# Patient Record
Sex: Female | Born: 1995 | Race: Black or African American | Hispanic: No | Marital: Single
Health system: Midwestern US, Community
[De-identification: ages and names within clinical notes are randomized; demographics above are authoritative.]

## PROBLEM LIST (undated history)

## (undated) ENCOUNTER — Ambulatory Visit (HOSPITAL_COMMUNITY): Admission: EM | Payer: Medicaid Other | Source: Home / Self Care

## (undated) DIAGNOSIS — G479 Sleep disorder, unspecified: Secondary | ICD-10-CM

## (undated) DIAGNOSIS — F909 Attention-deficit hyperactivity disorder, unspecified type: Secondary | ICD-10-CM

## (undated) DIAGNOSIS — F419 Anxiety disorder, unspecified: Secondary | ICD-10-CM

## (undated) DIAGNOSIS — G43909 Migraine, unspecified, not intractable, without status migrainosus: Secondary | ICD-10-CM

## (undated) DIAGNOSIS — I10 Essential (primary) hypertension: Secondary | ICD-10-CM

## (undated) DIAGNOSIS — D649 Anemia, unspecified: Secondary | ICD-10-CM

## (undated) DIAGNOSIS — B999 Unspecified infectious disease: Secondary | ICD-10-CM

## (undated) DIAGNOSIS — R0600 Dyspnea, unspecified: Secondary | ICD-10-CM

## (undated) DIAGNOSIS — R569 Unspecified convulsions: Secondary | ICD-10-CM

## (undated) DIAGNOSIS — R87629 Unspecified abnormal cytological findings in specimens from vagina: Secondary | ICD-10-CM

## (undated) DIAGNOSIS — G8929 Other chronic pain: Secondary | ICD-10-CM

## (undated) DIAGNOSIS — T782XXA Anaphylactic shock, unspecified, initial encounter: Secondary | ICD-10-CM

## (undated) DIAGNOSIS — M549 Dorsalgia, unspecified: Secondary | ICD-10-CM

## (undated) DIAGNOSIS — F319 Bipolar disorder, unspecified: Secondary | ICD-10-CM

## (undated) DIAGNOSIS — J45909 Unspecified asthma, uncomplicated: Secondary | ICD-10-CM

## (undated) HISTORY — DX: Anemia, unspecified: D64.9

## (undated) HISTORY — DX: Anxiety disorder, unspecified: F41.9

## (undated) HISTORY — DX: Dyspnea, unspecified: R06.00

## (undated) HISTORY — PX: NO PAST SURGERIES: SHX2092

## (undated) HISTORY — DX: Unspecified abnormal cytological findings in specimens from vagina: R87.629

## (undated) HISTORY — DX: Unspecified convulsions: R56.9

---
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
## 2014-03-01 ENCOUNTER — Inpatient Hospital Stay
Admit: 2014-03-01 | Discharge: 2014-03-01 | Disposition: A | Discharge status: Home / Self Care | Payer: Self-pay | Attending: Emergency Medicine

## 2014-03-01 DIAGNOSIS — N946 Dysmenorrhea, unspecified: Secondary | ICD-10-CM

## 2014-03-01 LAB — URINALYSIS W/ RFLX MICROSCOPIC
Bilirubin: NEGATIVE
Glucose: NEGATIVE mg/dL
Ketone: NEGATIVE mg/dL
Nitrites: NEGATIVE
Specific gravity: 1.018 (ref 1.005–1.030)
Urobilinogen: 1 EU/dL (ref 0.2–1.0)
pH (UA): 7 (ref 5.0–8.0)

## 2014-03-01 LAB — URINE MICROSCOPIC ONLY

## 2014-03-01 LAB — HCG URINE, QL: HCG urine, QL: NEGATIVE

## 2014-03-01 NOTE — ED Notes (Signed)
I have reviewed discharge instructions with the patient.  The patient verbalized understanding.

## 2014-03-01 NOTE — ED Provider Notes (Signed)
HPI Comments: 19yoF to ED c/o period pain and nausea. Pt states she vomited x 1 yesterday and called out of work today due to period pain. Also states her nose is congested and she cannot breathe. States she usually has period cramps like this. Denies fever, chills, diarrhea, cough, HA, dizziness, CP, SOB, dysuria, or any other complaints. States she needs a work note. Currently not nauseated.    Patient is a 19 y.o. female presenting with nausea. The history is provided by the patient.   Nausea          Past Medical History:   Diagnosis Date   ??? Anaphylaxis        No past surgical history on file.      No family history on file.    History     Social History   ??? Marital Status: SINGLE     Spouse Name: N/A   ??? Number of Children: N/A   ??? Years of Education: N/A     Occupational History   ??? Not on file.     Social History Main Topics   ??? Smoking status: Current Some Day Smoker   ??? Smokeless tobacco: Not on file   ??? Alcohol Use: Yes      Comment: occasionally   ??? Drug Use: No   ??? Sexual Activity:     Partners: Male     Pharmacist, hospitalBirth Control/ Protection: None     Other Topics Concern   ??? Not on file     Social History Narrative           ALLERGIES: Review of patient's allergies indicates no known allergies.      Review of Systems   Gastrointestinal: Positive for nausea.   All other systems reviewed and are negative.      Filed Vitals:    03/01/14 0924   BP: 127/83   Pulse: 97   Temp: 98.5 ??F (36.9 ??C)   Resp: 20   Height: 5\' 5"  (1.651 m)   Weight: 94.348 kg (208 lb)   SpO2: 100%            Physical Exam   Constitutional: She is oriented to person, place, and time. She appears well-developed and well-nourished. No distress.   HENT:   Head: Normocephalic and atraumatic.   Right Ear: External ear normal.   Left Ear: External ear normal.   +nasal congestion   Eyes: Conjunctivae are normal.   Neck: Normal range of motion. Neck supple.   Cardiovascular: Normal rate and regular rhythm.     Pulmonary/Chest: Effort normal and breath sounds normal.   Abdominal: Soft. Bowel sounds are normal. She exhibits no distension. There is no tenderness.   Musculoskeletal: Normal range of motion. She exhibits no edema.   Lymphadenopathy:     She has no cervical adenopathy.   Neurological: She is alert and oriented to person, place, and time.   Skin: Skin is warm and dry. No rash noted. She is not diaphoretic.   Psychiatric: She has a normal mood and affect.        MDM  Number of Diagnoses or Management Options  Diagnosis management comments: Pt here with dysmenorrhea and nasal congestion. I suggested she get OTC sudafed and motrin for her symptoms. Currently not nauseated. States she needs a work note for calling out of work. Amariss Detamore, PA 10:24 AM           Amount and/or Complexity of Data Reviewed  Clinical  lab tests: ordered and reviewed    Risk of Complications, Morbidity, and/or Mortality  Presenting problems: moderate  Diagnostic procedures: low  Management options: low    Patient Progress  Patient progress: stable      Procedures

## 2014-03-01 NOTE — ED Notes (Signed)
Patient states she threw up once last night and needs a note for work. Patient stated she has had stomach pains for 2 weeks.

## 2014-11-14 ENCOUNTER — Inpatient Hospital Stay
Admit: 2014-11-14 | Discharge: 2014-11-15 | Disposition: A | Discharge status: Home / Self Care | Payer: Self-pay | Attending: Emergency Medicine

## 2014-11-14 DIAGNOSIS — N907 Vulvar cyst: Secondary | ICD-10-CM

## 2014-11-14 NOTE — ED Triage Notes (Signed)
Pt presents to the ED with CC of vaginal skin problem and swelling to the right labia. Pt states "I have a bump on my pussy." Pt denies pain or draining. Pt states noticed swelling to the right labia after menstrual cycle.

## 2014-11-14 NOTE — ED Provider Notes (Signed)
HPI Comments: 8:01PM Megan Buck is a 19 year old female siwith no pertinent medical hx who presents to ED c/o vaginal abscess to right labia. Pt explains onset after menstrual cycle. Denies vaginal discharge/bleeding, fever and chills. Denies hx of STD. Denies any chance of pregnancy. Reports smoking cigarettes and marijuana. No further complaints at this time.    Patient is a 19 y.o. female presenting with skin problem and vaginal edema. The history is provided by the patient.   Skin Problem      Vaginal Swelling   Primary symptoms include no vaginal bleeding. Pertinent negatives include no fever.        Past Medical History:   Diagnosis Date   ??? Anaphylaxis        History reviewed. No pertinent past surgical history.      History reviewed. No pertinent family history.    Social History     Social History   ??? Marital status: SINGLE     Spouse name: N/A   ??? Number of children: N/A   ??? Years of education: N/A     Occupational History   ??? Not on file.     Social History Main Topics   ??? Smoking status: Current Some Day Smoker   ??? Smokeless tobacco: Not on file   ??? Alcohol use Yes      Comment: occasionally   ??? Drug use: No   ??? Sexual activity: Yes     Partners: Male     Birth control/ protection: None     Other Topics Concern   ??? Not on file     Social History Narrative         ALLERGIES: Mushroom and Lime    Review of Systems   Constitutional: Negative.  Negative for chills and fever.   HENT: Negative.    Eyes: Negative.    Respiratory: Negative.    Cardiovascular: Negative.    Gastrointestinal: Negative.    Endocrine: Negative.    Genitourinary: Negative.  Negative for vaginal bleeding and vaginal discharge.   Musculoskeletal: Negative.    Skin: Negative.         Vaginal swelling   Allergic/Immunologic: Negative.    Neurological: Negative.    Hematological: Negative.    Psychiatric/Behavioral: Negative.    All other systems reviewed and are negative.      Vitals:    11/14/14 1828   BP: (!) 126/92    Pulse: 97   Resp: 16   Temp: 98.4 ??F (36.9 ??C)   SpO2: 100%   Weight: 89.3 kg (196 lb 12.8 oz)   Height:  (1.651 m)            Physical Exam   Constitutional: She is oriented to person, place, and time. She appears well-developed and well-nourished. No distress.   HENT:   Head: Normocephalic.   Right Ear: External ear normal.   Left Ear: External ear normal.   Mouth/Throat: No oropharyngeal exudate.   Eyes: Conjunctivae and EOM are normal. Pupils are equal, round, and reactive to light. Right eye exhibits no discharge. Left eye exhibits no discharge. No scleral icterus.   Neck: Normal range of motion. Neck supple. No JVD present. No tracheal deviation present. No thyromegaly present.   Cardiovascular: Normal rate, regular rhythm, normal heart sounds and intact distal pulses.  Exam reveals no gallop and no friction rub.    No murmur heard.  Pulmonary/Chest: Effort normal and breath sounds normal. No stridor. No respiratory distress.  She has no wheezes. She has no rales. She exhibits no tenderness.   Abdominal: Soft. Bowel sounds are normal. She exhibits no distension and no mass. There is no tenderness. There is no rebound and no guarding.   Genitourinary:   Genitourinary Comments: External right labia majora 2 mm area of swelling with fluctuance and erythema  Minimal tenderness with palpation   Musculoskeletal: Normal range of motion. She exhibits no edema or tenderness.   Lymphadenopathy:     She has no cervical adenopathy.   Neurological: She is alert and oriented to person, place, and time. She displays normal reflexes. No cranial nerve deficit. She exhibits normal muscle tone. Coordination normal.   Skin: Skin is warm and dry. No rash noted. She is not diaphoretic. No erythema. No pallor.   Nursing note and vitals reviewed.       MDM  Number of Diagnoses or Management Options  Epidermoid cyst of labia majora:   Risk of Complications, Morbidity, and/or Mortality  Presenting problems: low   Diagnostic procedures: low  Management options: low      ED Course       Procedures      VITALS:  Patient Vitals for the past 24 hrs:   BP Temp Pulse Resp SpO2 Height Weight   11/14/14 1828 (!) 126/92 98.4 ??F (36.9 ??C) 97 16 100 % 5\' 5"  (1.651 m) 89.3 kg (196 lb 12.8 oz)         ED MEDS:  Medications - No data to display      Reevaluation of patient:   8:38 PM   Rechecked patient. The patient is resting comfortably on the ED cart with no new complaints vocalized. Upon reevaluation, patient reports feeling better. I informed the patient on all ED findings, my clinical impression, and plan for discharge. The patient verbalized understanding and agreement with the plan. All questions answered.      Diagnosis:   1. Epidermoid cyst of labia majora          Discharge/Disposition:      Patient was discharged in stable condition.     Rechecked the pt. I have informed the pt of results and diagnosis. I have given the pt written instructions regarding their diagnosis, expectations, follow up and return precautions regarding diagnosis. I explained to the pt that emergent conditions may arise, and to return to the ED for those conditions or for new or worsening symptoms. I've explained the importance of follow-up with their PCP. Pt understands and agrees with plan. All questions answered. Pt is ready for discharge.       Scribe Attestation  I, Health visitorVania Johnson-Chaney, am scribing for and in the presence of Sherren KernsKenneth B Azucena Dart, MD at 8:01 PM on 11/14/14  Traci SermonVania Johnson-Chaney, Scribe      Physician Attestation  I personally performed the services described in the documentation, reviewed the documentation, as recorded by the scribe in my presence, and it accurately and completely records my words and actions.    Darlis LoanKenneth Darian Cansler, MD

## 2014-11-14 NOTE — ED Notes (Signed)
Megan Buck is a 19 y.o. female that was discharged in stable. Pt was accompanied by friend. Pt is not driving. The patients diagnosis, condition and treatment were explained to  patient and aftercare instructions were given.  The patient verbalized understanding. Patient armband removed and shredded.

## 2014-11-15 LAB — URINALYSIS W/ RFLX MICROSCOPIC
Bilirubin: NEGATIVE
Blood: NEGATIVE
Glucose: NEGATIVE mg/dL
Ketone: NEGATIVE mg/dL
Nitrites: NEGATIVE
Protein: NEGATIVE mg/dL
Specific gravity: 1.02 (ref 1.005–1.030)
Urobilinogen: 1 EU/dL (ref 0.2–1.0)
pH (UA): 8.5 — ABNORMAL HIGH (ref 5.0–8.0)

## 2014-11-15 LAB — URINE MICROSCOPIC ONLY
RBC: 0 /hpf (ref 0–5)
WBC: 0 /hpf (ref 0–4)

## 2014-11-15 LAB — HCG URINE, QL: HCG urine, QL: NEGATIVE

## 2014-11-15 MED ORDER — IBUPROFEN 400 MG TAB
400 mg | ORAL | Status: DC
Start: 2014-11-15 — End: 2014-11-14

## 2014-11-15 MED ORDER — DOXYCYCLINE 100 MG CAP
100 mg | ORAL_CAPSULE | Freq: Two times a day (BID) | ORAL | 0 refills | Status: AC
Start: 2014-11-15 — End: 2014-11-24

## 2014-11-15 MED ORDER — DOXYCYCLINE HYCLATE 100 MG CAP
100 mg | ORAL | Status: DC
Start: 2014-11-15 — End: 2014-11-14

## 2014-11-15 MED ORDER — IBUPROFEN 600 MG TAB
600 mg | ORAL_TABLET | Freq: Four times a day (QID) | ORAL | 0 refills | Status: DC | PRN
Start: 2014-11-15 — End: 2016-02-04

## 2014-11-15 MED FILL — DOXYCYCLINE HYCLATE 100 MG CAP: 100 mg | ORAL | Qty: 1

## 2014-11-15 MED FILL — IBUPROFEN 400 MG TAB: 400 mg | ORAL | Qty: 2

## 2015-02-22 ENCOUNTER — Encounter (HOSPITAL_COMMUNITY): Payer: Self-pay | Admitting: Emergency Medicine

## 2015-02-22 DIAGNOSIS — G43909 Migraine, unspecified, not intractable, without status migrainosus: Secondary | ICD-10-CM | POA: Insufficient documentation

## 2015-02-22 DIAGNOSIS — F17200 Nicotine dependence, unspecified, uncomplicated: Secondary | ICD-10-CM | POA: Diagnosis not present

## 2015-02-22 NOTE — ED Notes (Signed)
Pt. reports migraine headache with photophobia onset this evening , denies fever ,emesis or head injury.

## 2015-02-23 ENCOUNTER — Encounter (HOSPITAL_COMMUNITY): Payer: Self-pay | Admitting: Emergency Medicine

## 2015-02-23 ENCOUNTER — Emergency Department (HOSPITAL_COMMUNITY)
Admission: EM | Admit: 2015-02-23 | Discharge: 2015-02-23 | Disposition: A | Discharge status: Home / Self Care | Payer: Medicaid Other | Attending: Emergency Medicine | Admitting: Emergency Medicine

## 2015-02-23 HISTORY — DX: Sleep disorder, unspecified: G47.9

## 2015-02-23 HISTORY — DX: Bipolar disorder, unspecified: F31.9

## 2015-02-23 HISTORY — DX: Migraine, unspecified, not intractable, without status migrainosus: G43.909

## 2015-02-23 HISTORY — DX: Attention-deficit hyperactivity disorder, unspecified type: F90.9

## 2015-02-23 LAB — BASIC METABOLIC PANEL
ANION GAP: 9 (ref 5–15)
BUN: 14 mg/dL (ref 6–20)
CALCIUM: 9.1 mg/dL (ref 8.9–10.3)
CO2: 22 mmol/L (ref 22–32)
CREATININE: 0.66 mg/dL (ref 0.44–1.00)
Chloride: 108 mmol/L (ref 101–111)
GFR calc Af Amer: >60 mL/min (ref >60)
GLUCOSE: 89 mg/dL (ref 65–99)
Potassium: 3.6 mmol/L (ref 3.5–5.1)
Sodium: 139 mmol/L (ref 135–145)

## 2015-02-23 LAB — CBC WITH DIFFERENTIAL/PLATELET
BASOS ABS: 0 10*3/uL (ref 0.0–0.1)
Basophils Relative: 0 %
EOS PCT: 3 %
Eosinophils Absolute: 0.4 10*3/uL (ref 0.0–0.7)
HEMATOCRIT: 35.4 % — AB (ref 36.0–46.0)
Hemoglobin: 11.4 g/dL — ABNORMAL LOW (ref 12.0–15.0)
LYMPHS PCT: 33 %
Lymphs Abs: 4.9 10*3/uL — ABNORMAL HIGH (ref 0.7–4.0)
MCH: 26.6 pg (ref 26.0–34.0)
MCHC: 32.2 g/dL (ref 30.0–36.0)
MCV: 82.7 fL (ref 78.0–100.0)
MONO ABS: 0.7 10*3/uL (ref 0.1–1.0)
MONOS PCT: 5 %
Neutro Abs: 8.6 10*3/uL — ABNORMAL HIGH (ref 1.7–7.7)
Neutrophils Relative %: 59 %
PLATELETS: 309 10*3/uL (ref 150–400)
RBC: 4.28 MIL/uL (ref 3.87–5.11)
RDW: 15.1 % (ref 11.5–15.5)
WBC: 14.7 10*3/uL — ABNORMAL HIGH (ref 4.0–10.5)

## 2015-02-23 NOTE — ED Notes (Signed)
Patient left stated that she would go to Urgent care in the morning cause she felt much better and had something to do.Robyn Cummings

## 2015-03-13 ENCOUNTER — Encounter (HOSPITAL_COMMUNITY): Payer: Self-pay | Admitting: Emergency Medicine

## 2015-03-13 ENCOUNTER — Emergency Department (HOSPITAL_COMMUNITY)
Admission: EM | Admit: 2015-03-13 | Discharge: 2015-03-13 | Disposition: A | Discharge status: Home / Self Care | Payer: Medicaid Other | Attending: Emergency Medicine | Admitting: Emergency Medicine

## 2015-03-13 DIAGNOSIS — Y9389 Activity, other specified: Secondary | ICD-10-CM | POA: Insufficient documentation

## 2015-03-13 DIAGNOSIS — Y998 Other external cause status: Secondary | ICD-10-CM | POA: Diagnosis not present

## 2015-03-13 DIAGNOSIS — F17200 Nicotine dependence, unspecified, uncomplicated: Secondary | ICD-10-CM | POA: Insufficient documentation

## 2015-03-13 DIAGNOSIS — G8929 Other chronic pain: Secondary | ICD-10-CM | POA: Insufficient documentation

## 2015-03-13 DIAGNOSIS — X58XXXA Exposure to other specified factors, initial encounter: Secondary | ICD-10-CM | POA: Diagnosis not present

## 2015-03-13 DIAGNOSIS — S29012A Strain of muscle and tendon of back wall of thorax, initial encounter: Secondary | ICD-10-CM | POA: Insufficient documentation

## 2015-03-13 DIAGNOSIS — Y9289 Other specified places as the place of occurrence of the external cause: Secondary | ICD-10-CM | POA: Insufficient documentation

## 2015-03-13 DIAGNOSIS — S3992XA Unspecified injury of lower back, initial encounter: Secondary | ICD-10-CM | POA: Diagnosis present

## 2015-03-13 DIAGNOSIS — S4992XA Unspecified injury of left shoulder and upper arm, initial encounter: Secondary | ICD-10-CM | POA: Insufficient documentation

## 2015-03-13 DIAGNOSIS — S4991XA Unspecified injury of right shoulder and upper arm, initial encounter: Secondary | ICD-10-CM | POA: Insufficient documentation

## 2015-03-13 DIAGNOSIS — S29019A Strain of muscle and tendon of unspecified wall of thorax, initial encounter: Secondary | ICD-10-CM

## 2015-03-13 DIAGNOSIS — Z8679 Personal history of other diseases of the circulatory system: Secondary | ICD-10-CM | POA: Diagnosis not present

## 2015-03-13 DIAGNOSIS — Z8659 Personal history of other mental and behavioral disorders: Secondary | ICD-10-CM | POA: Insufficient documentation

## 2015-03-13 MED ORDER — IBUPROFEN 600 MG PO TABS: 600.0000 mg | ORAL_TABLET | Freq: Three times a day (TID) | ORAL | Status: DC

## 2015-03-13 MED ORDER — METHOCARBAMOL 500 MG PO TABS: 1000.0000 mg | ORAL_TABLET | Freq: Three times a day (TID) | ORAL | Status: DC | PRN

## 2015-03-13 MED ORDER — OXYCODONE-ACETAMINOPHEN 5-325 MG PO TABS
1.0000 | ORAL_TABLET | Freq: Once | ORAL | Status: AC
  Administered 2015-03-13: 1 via ORAL

## 2015-03-13 MED ORDER — KETOROLAC TROMETHAMINE 60 MG/2ML IM SOLN
60.0000 mg | Freq: Once | INTRAMUSCULAR | Status: AC
  Administered 2015-03-13: 60 mg via INTRAMUSCULAR
  Filled 2015-03-13: qty 2

## 2015-03-13 MED ORDER — METHOCARBAMOL 500 MG PO TABS
1000.0000 mg | ORAL_TABLET | Freq: Once | ORAL | Status: AC
  Administered 2015-03-13: 1000 mg via ORAL
  Filled 2015-03-13: qty 2

## 2015-03-13 NOTE — Discharge Instructions (Signed)

## 2015-03-13 NOTE — ED Notes (Signed)
Pt. reports chronic back pain for several years worse this week , pain radiating to shoulders unrelieved by OTC Ibuprofen . Pain ( upper/lower back ) increases with movement and changing positions . Pt. Denies  injury or fall .

## 2015-03-13 NOTE — ED Provider Notes (Signed)
CSN: [id number]     Arrival date & time 03/13/15  0425 History   First MD Initiated Contact with Patient 03/13/15 [phone]     Chief Complaint  Patient presents with  . Back Pain     (Consider location/radiation/quality/duration/timing/severity/associated sxs/prior Treatment) HPI Patient presents with acute worsening of her chronic back pain. Pain is mostly in the upper back. Is worse with movement and palpation. Patient states it is exacerbated by heavy lifting at work. No other trauma. Denies fever or chills. No shortness of breath or cough. No incontinence, focal weakness or numbness. Patient states she occasionally takes 1000 g of ibuprofen for pain. Took 1000 mg at 1 AM. Past Medical History  Diagnosis Date  . Anaphylactic reaction   . Migraine   . Bipolar 1 disorder (HCC)   . ADHD (attention deficit hyperactivity disorder)   . Sleep disorder    History reviewed. No pertinent past surgical history. No family history on file. Social History  Substance Use Topics  . Smoking status: Current Every Day Smoker  . Smokeless tobacco: None  . Alcohol Use: No   OB History    Gravida Para Term Preterm AB TAB SAB Ectopic Multiple Living   0 0 0 0 0 0 0 0       Review of Systems  Constitutional: Negative for fever and chills.  Respiratory: Negative for shortness of breath.   Cardiovascular: Negative for chest pain.  Gastrointestinal: Negative for nausea, vomiting and abdominal pain.  Genitourinary: Negative for difficulty urinating.  Musculoskeletal: Positive for myalgias and back pain. Negative for neck pain and neck stiffness.  Skin: Negative for rash and wound.  Neurological: Negative for weakness and numbness.  All other systems reviewed and are negative.     Allergies  Mushroom extract complex; Fish allergy; Lime; and Spinach  Home Medications   Prior to Admission medications   Medication Sig Start Date End Date Taking? Authorizing Provider  diphenhydrAMINE (BENADRYL)  25 MG tablet Take 1 tablet (25 mg total) by mouth every 6 (six) hours. 07/05/13   Blake Divine, MD  EPINEPHrine (EPIPEN) 0.3 mg/0.3 mL IJ SOAJ injection Inject 0.3 mLs (0.3 mg total) into the muscle as needed. 07/05/13   Blake Divine, MD  ibuprofen (ADVIL,MOTRIN) 600 MG tablet Take 1 tablet (600 mg total) by mouth 3 (three) times daily after meals. 03/13/15   Loren Racer, MD  methocarbamol (ROBAXIN) 500 MG tablet Take 2 tablets (1,000 mg total) by mouth every 8 (eight) hours as needed for muscle spasms. 03/13/15   Loren Racer, MD   BP 145/97 mmHg  Pulse 78  Temp(Src) 98.7 F (37.1 C) (Oral)  Resp 16  Ht  (1.651 m)  Wt 190 lb (86.183 kg)  BMI 31.62 kg/m2  SpO2 99%  LMP 02/04/2015 (Exact Date) Physical Exam  Constitutional: She is oriented to person, place, and time. She appears well-developed and well-nourished. No distress.  HENT:  Head: Normocephalic and atraumatic.  Mouth/Throat: Oropharynx is clear and moist.  Eyes: EOM are normal. Pupils are equal, round, and reactive to light.  Neck: Normal range of motion. Neck supple.  Bilateral trapezius tenderness to palpation. No definite meningismus  Cardiovascular: Normal rate and regular rhythm.  Exam reveals no gallop and no friction rub.   No murmur heard. Pulmonary/Chest: Effort normal and breath sounds normal. No respiratory distress. She has no wheezes. She has no rales.  Abdominal: Soft. Bowel sounds are normal.  Musculoskeletal: Normal range of motion. She exhibits tenderness. She exhibits  no edema.  Diffuse tenderness to the thoracic musculature bilaterally. No bony or midline tenderness. No deformity. No midline lumbar tenderness. Negative bilateral straight leg raise. Distal pulses are equal and intact.  Neurological: She is alert and oriented to person, place, and time.  Patient ambulates without assistance. 5/5 motor in all extremities. Sensation is fully intact. No saddle anesthesia.  Skin: Skin is warm and dry. No rash  noted. No erythema.  Psychiatric: She has a normal mood and affect. Her behavior is normal.  Nursing note and vitals reviewed.   ED Course  Procedures (including critical care time) Labs Review Labs Reviewed - No data to display  Imaging Review No results found. I have personally reviewed and evaluated these images and lab results as part of my medical decision-making.   EKG Interpretation None      MDM   Final diagnoses:  Thoracic myofascial strain, initial encounter    Patient with muscular tenderness especially of the thoracic musculature. Consistent with muscle strain and spasm. Patient educated on proper use of NSAIDs and following package instructions. Will also give muscle relaxant. Return precautions given.    Loren Raceravid Kaithlyn Teagle, MD 03/13/15 [phone]

## 2015-04-15 ENCOUNTER — Ambulatory Visit (HOSPITAL_COMMUNITY)
Admission: EM | Admit: 2015-04-15 | Discharge: 2015-04-15 | Disposition: A | Discharge status: Home / Self Care | Payer: No Typology Code available for payment source | Source: Ambulatory Visit | Attending: Emergency Medicine | Admitting: Emergency Medicine

## 2015-04-15 ENCOUNTER — Encounter (HOSPITAL_COMMUNITY): Payer: Self-pay

## 2015-04-15 ENCOUNTER — Emergency Department (HOSPITAL_COMMUNITY)
Admission: EM | Admit: 2015-04-15 | Discharge: 2015-04-15 | Disposition: A | Discharge status: Home / Self Care | Payer: Medicaid Other | Attending: Emergency Medicine | Admitting: Emergency Medicine

## 2015-04-15 DIAGNOSIS — M549 Dorsalgia, unspecified: Secondary | ICD-10-CM | POA: Insufficient documentation

## 2015-04-15 DIAGNOSIS — F1210 Cannabis abuse, uncomplicated: Secondary | ICD-10-CM | POA: Insufficient documentation

## 2015-04-15 DIAGNOSIS — G8929 Other chronic pain: Secondary | ICD-10-CM | POA: Insufficient documentation

## 2015-04-15 DIAGNOSIS — F17210 Nicotine dependence, cigarettes, uncomplicated: Secondary | ICD-10-CM | POA: Insufficient documentation

## 2015-04-15 DIAGNOSIS — Y9289 Other specified places as the place of occurrence of the external cause: Secondary | ICD-10-CM | POA: Insufficient documentation

## 2015-04-15 DIAGNOSIS — Z0441 Encounter for examination and observation following alleged adult rape: Secondary | ICD-10-CM | POA: Insufficient documentation

## 2015-04-15 DIAGNOSIS — Z791 Long term (current) use of non-steroidal anti-inflammatories (NSAID): Secondary | ICD-10-CM | POA: Insufficient documentation

## 2015-04-15 DIAGNOSIS — Y9389 Activity, other specified: Secondary | ICD-10-CM | POA: Insufficient documentation

## 2015-04-15 DIAGNOSIS — M545 Low back pain: Secondary | ICD-10-CM | POA: Insufficient documentation

## 2015-04-15 DIAGNOSIS — F1310 Sedative, hypnotic or anxiolytic abuse, uncomplicated: Secondary | ICD-10-CM | POA: Insufficient documentation

## 2015-04-15 DIAGNOSIS — Z8659 Personal history of other mental and behavioral disorders: Secondary | ICD-10-CM | POA: Insufficient documentation

## 2015-04-15 DIAGNOSIS — Z8679 Personal history of other diseases of the circulatory system: Secondary | ICD-10-CM | POA: Insufficient documentation

## 2015-04-15 DIAGNOSIS — Y998 Other external cause status: Secondary | ICD-10-CM | POA: Insufficient documentation

## 2015-04-15 DIAGNOSIS — Z79899 Other long term (current) drug therapy: Secondary | ICD-10-CM | POA: Insufficient documentation

## 2015-04-15 HISTORY — DX: Dorsalgia, unspecified: M54.9

## 2015-04-15 HISTORY — DX: Other chronic pain: G89.29

## 2015-04-15 LAB — RAPID URINE DRUG SCREEN, HOSP PERFORMED
Amphetamines: NOT DETECTED
Barbiturates: NOT DETECTED
Benzodiazepines: POSITIVE — AB
Cocaine: NOT DETECTED
Opiates: NOT DETECTED
TETRAHYDROCANNABINOL: POSITIVE — AB

## 2015-04-15 LAB — POC URINE PREG, ED: PREG TEST UR: NEGATIVE

## 2015-04-15 MED ORDER — METRONIDAZOLE 500 MG PO TABS
2000.0000 mg | ORAL_TABLET | Freq: Once | ORAL | Status: AC
  Administered 2015-04-15: 2000 mg via ORAL

## 2015-04-15 MED ORDER — PROMETHAZINE HCL 25 MG PO TABS: 25.0000 mg | ORAL_TABLET | Freq: Four times a day (QID) | ORAL | Status: DC | PRN

## 2015-04-15 MED ORDER — IBUPROFEN 200 MG PO TABS
400.0000 mg | ORAL_TABLET | Freq: Once | ORAL | Status: AC
  Administered 2015-04-15: 400 mg via ORAL
  Filled 2015-04-15: qty 2

## 2015-04-15 MED ORDER — ULIPRISTAL ACETATE 30 MG PO TABS
30.0000 mg | ORAL_TABLET | Freq: Once | ORAL | Status: AC
  Administered 2015-04-15: 30 mg via ORAL
  Filled 2015-04-15: qty 1

## 2015-04-15 MED ORDER — AZITHROMYCIN 250 MG PO TABS
1000.0000 mg | ORAL_TABLET | Freq: Once | ORAL | Status: AC
Start: 2015-04-15 — End: 2015-04-15
  Administered 2015-04-15: 1000 mg via ORAL

## 2015-04-15 MED ORDER — CEFTRIAXONE SODIUM 250 MG IJ SOLR
250.0000 mg | Freq: Once | INTRAMUSCULAR | Status: AC
  Administered 2015-04-15: 250 mg via INTRAMUSCULAR

## 2015-04-15 MED ORDER — LIDOCAINE HCL (PF) 1 % IJ SOLN
0.9000 mL | Freq: Once | INTRAMUSCULAR | Status: AC
  Administered 2015-04-15: 0.9 mL

## 2015-04-15 NOTE — ED Notes (Signed)
SANE RN remains at beside.

## 2015-04-15 NOTE — SANE Note (Signed)
At 1145, patient's mother and aunt arrived. Patient crying stating that she wanted to be tested for disease. Patient's speech is slurred, but she is awake and oriented. Reviewed SANE evaluation process. Patient agrees to anonymous collection.  RN and MD aware.
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
## 2015-04-15 NOTE — SANE Note (Signed)
At 1100, FNE approached patient who was sleeping soundly. FNE woke patient several times who would then go back to sleep. Patient would also fall asleep while talking. Notified MD and RN that FNE would need to wait until patient was fully alert and oriented before attempting SANE consult.

## 2015-04-15 NOTE — ED Notes (Signed)
SANE RN reports that she can not be here for at least 6 hours.  Consulting civil engineerCharge RN notified.

## 2015-04-15 NOTE — ED Notes (Signed)
Charge RN made aware that they have called in another SANE RN.
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
## 2015-04-18 NOTE — SANE Note (Signed)
Reviewed discharge instructions with patient and mother. Encouraged follow up with GYN for STD/HIV/pregnancy testing. Mother and aunt state they will notify personal GYN. Declined Family Services referral..."We'll provide our own counseling".

## 2015-05-08 ENCOUNTER — Encounter (HOSPITAL_COMMUNITY): Payer: Self-pay | Admitting: Emergency Medicine

## 2015-05-08 ENCOUNTER — Emergency Department (HOSPITAL_COMMUNITY)
Admission: EM | Admit: 2015-05-08 | Discharge: 2015-05-09 | Disposition: A | Discharge status: Home / Self Care | Payer: No Typology Code available for payment source | Attending: Emergency Medicine | Admitting: Emergency Medicine

## 2015-05-08 ENCOUNTER — Emergency Department (HOSPITAL_COMMUNITY): Payer: No Typology Code available for payment source

## 2015-05-08 DIAGNOSIS — S0990XA Unspecified injury of head, initial encounter: Secondary | ICD-10-CM | POA: Insufficient documentation

## 2015-05-08 DIAGNOSIS — G8929 Other chronic pain: Secondary | ICD-10-CM | POA: Insufficient documentation

## 2015-05-08 DIAGNOSIS — F1210 Cannabis abuse, uncomplicated: Secondary | ICD-10-CM | POA: Diagnosis present

## 2015-05-08 DIAGNOSIS — F0781 Postconcussional syndrome: Secondary | ICD-10-CM

## 2015-05-08 DIAGNOSIS — Z8679 Personal history of other diseases of the circulatory system: Secondary | ICD-10-CM | POA: Insufficient documentation

## 2015-05-08 DIAGNOSIS — R413 Other amnesia: Secondary | ICD-10-CM | POA: Diagnosis present

## 2015-05-08 DIAGNOSIS — Y9389 Activity, other specified: Secondary | ICD-10-CM | POA: Insufficient documentation

## 2015-05-08 DIAGNOSIS — Y998 Other external cause status: Secondary | ICD-10-CM | POA: Insufficient documentation

## 2015-05-08 DIAGNOSIS — Y92410 Unspecified street and highway as the place of occurrence of the external cause: Secondary | ICD-10-CM | POA: Insufficient documentation

## 2015-05-08 DIAGNOSIS — S199XXA Unspecified injury of neck, initial encounter: Secondary | ICD-10-CM | POA: Insufficient documentation

## 2015-05-08 DIAGNOSIS — F17210 Nicotine dependence, cigarettes, uncomplicated: Secondary | ICD-10-CM | POA: Insufficient documentation

## 2015-05-08 DIAGNOSIS — Z79899 Other long term (current) drug therapy: Secondary | ICD-10-CM | POA: Insufficient documentation

## 2015-05-08 DIAGNOSIS — F309 Manic episode, unspecified: Secondary | ICD-10-CM | POA: Insufficient documentation

## 2015-05-08 DIAGNOSIS — Z3202 Encounter for pregnancy test, result negative: Secondary | ICD-10-CM | POA: Insufficient documentation

## 2015-05-08 DIAGNOSIS — Z791 Long term (current) use of non-steroidal anti-inflammatories (NSAID): Secondary | ICD-10-CM | POA: Insufficient documentation

## 2015-05-08 LAB — URINALYSIS, ROUTINE W REFLEX MICROSCOPIC
Bilirubin Urine: NEGATIVE
Glucose, UA: NEGATIVE mg/dL
Hgb urine dipstick: NEGATIVE
KETONES UR: 15 mg/dL — AB
LEUKOCYTES UA: NEGATIVE
NITRITE: NEGATIVE
PH: 6.5 (ref 5.0–8.0)
PROTEIN: NEGATIVE mg/dL
Specific Gravity, Urine: 1.028 (ref 1.005–1.030)

## 2015-05-08 LAB — CBC WITH DIFFERENTIAL/PLATELET
BASOS PCT: 0 %
Basophils Absolute: 0 10*3/uL (ref 0.0–0.1)
Eosinophils Absolute: 0.3 10*3/uL (ref 0.0–0.7)
Eosinophils Relative: 2 %
HCT: 38.6 % (ref 36.0–46.0)
Hemoglobin: 12.3 g/dL (ref 12.0–15.0)
LYMPHS PCT: 30 %
Lymphs Abs: 3.7 10*3/uL (ref 0.7–4.0)
MCH: 26.7 pg (ref 26.0–34.0)
MCHC: 31.9 g/dL (ref 30.0–36.0)
MCV: 83.7 fL (ref 78.0–100.0)
Monocytes Absolute: 0.7 10*3/uL (ref 0.1–1.0)
Monocytes Relative: 6 %
NEUTROS ABS: 7.5 10*3/uL (ref 1.7–7.7)
Neutrophils Relative %: 62 %
PLATELETS: 296 10*3/uL (ref 150–400)
RBC: 4.61 MIL/uL (ref 3.87–5.11)
RDW: 14.3 % (ref 11.5–15.5)
WBC: 12.2 10*3/uL — AB (ref 4.0–10.5)

## 2015-05-08 LAB — BASIC METABOLIC PANEL
ANION GAP: 9 (ref 5–15)
BUN: 9 mg/dL (ref 6–20)
CALCIUM: 9.5 mg/dL (ref 8.9–10.3)
CO2: 26 mmol/L (ref 22–32)
Chloride: 104 mmol/L (ref 101–111)
Creatinine, Ser: 0.68 mg/dL (ref 0.44–1.00)
GFR calc non Af Amer: >60 mL/min (ref >60)
GLUCOSE: 93 mg/dL (ref 65–99)
POTASSIUM: 3.6 mmol/L (ref 3.5–5.1)
Sodium: 139 mmol/L (ref 135–145)

## 2015-05-08 LAB — LIPASE, BLOOD: LIPASE: 25 U/L (ref 11–51)

## 2015-05-08 LAB — RAPID URINE DRUG SCREEN, HOSP PERFORMED
Amphetamines: NOT DETECTED
BENZODIAZEPINES: NOT DETECTED
Barbiturates: NOT DETECTED
Cocaine: NOT DETECTED
Opiates: NOT DETECTED
Tetrahydrocannabinol: POSITIVE — AB

## 2015-05-08 LAB — ACETAMINOPHEN LEVEL: Acetaminophen (Tylenol), Serum: <10 ug/mL — ABNORMAL LOW (ref 10–30)

## 2015-05-08 LAB — SALICYLATE LEVEL

## 2015-05-08 LAB — I-STAT BETA HCG BLOOD, ED (MC, WL, AP ONLY)

## 2015-05-08 LAB — ETHANOL
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
## 2015-05-08 NOTE — BH Assessment (Signed)
Consulted with Shuvon Rankin, NP and informed her of UDS results and patient ETOH has not been collected as an update. She recommends patient being seeing face-to-face by psychiatry tomorrow for a consult.   Davina PokeJoVea Oktober Glazer, LCSW Therapeutic Triage Specialist [facility] 05/08/2015 9:59 PM

## 2015-05-08 NOTE — BH Assessment (Signed)
This Clinical research associatewriter added patient to Dr. [name] consult list [facility] be seen face-to-face.   Robyn PokeJoVea Kannon Granderson, Robyn Cummings Therapeutic Triage Specialist [facility] 05/08/2015 11:41 PM
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
## 2015-05-08 NOTE — ED Notes (Signed)
Pt out of room asking to go home.
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
## 2015-05-09 DIAGNOSIS — R413 Other amnesia: Secondary | ICD-10-CM | POA: Diagnosis present

## 2015-05-09 DIAGNOSIS — F1210 Cannabis abuse, uncomplicated: Secondary | ICD-10-CM | POA: Diagnosis present

## 2015-05-09 NOTE — ED Notes (Signed)
Spoke with Talbert ForestShirley, pt's friend. Talbert ForestShirley can come pick up the pt in 30 min. Dr. Deretha EmoryZackowski notified, he will place d/c orders and neuro follow-up.

## 2015-05-09 NOTE — ED Notes (Signed)
Dr. Shela CommonsJ , Psychiatry in with patient at this time.

## 2015-05-09 NOTE — ED Notes (Signed)
Pt changed into blue paper scrubs, belongings collected, psych hold rules paper given, pt states understanding.

## 2015-05-09 NOTE — Discharge Instructions (Signed)
Make a point at the follow-up with neurology referral information made but you have to make the phone call. Return for any new or worse symptoms.   Post-Concussion Syndrome Post-concussion syndrome describes the symptoms that can occur after a head injury. These symptoms can last from weeks to months. CAUSES  It is not clear why some head injuries cause post-concussion syndrome. It can occur whether your head injury was mild or severe and whether you were wearing head protection or not.  SIGNS AND SYMPTOMS  Memory difficulties.  Dizziness.  Headaches.  Double vision or blurry vision.  Sensitivity to light.  Hearing difficulties.  Depression.  Tiredness.  Weakness.  Difficulty with concentration.  Difficulty sleeping or staying asleep.  Vomiting.  Poor balance or instability on your feet.  Slow reaction time.  Difficulty learning and remembering things you have heard. DIAGNOSIS  There is no test to determine whether you have post-concussion syndrome. Your health care provider may order an imaging scan of your brain, such as a CT scan, to check for other problems that may be causing your symptoms (such as a severe injury inside your skull). TREATMENT  Usually, these problems disappear over time without medical care. Your health care provider may prescribe medicine to help ease your symptoms. It is important to follow up with a neurologist to evaluate your recovery and address any lingering symptoms or issues. HOME CARE INSTRUCTIONS   Take medicines only as directed by your health care provider. Do not take aspirin. Aspirin can slow blood clotting.  Sleep with your head slightly elevated to help with headaches.  Avoid any situation where there is potential for another head injury. This includes football, hockey, soccer, basketball, martial arts, downhill snow sports, and horseback riding. Your condition will get worse every time you experience a concussion. You should  avoid these activities until you are evaluated by the appropriate follow-up health care providers.  Keep all follow-up visits as directed by your health care provider. This is important. SEEK MEDICAL CARE IF:  You have increased problems paying attention or concentrating.  You have increased difficulty remembering or learning new information.  You need more time to complete tasks or assignments than before.  You have increased irritability or decreased ability to cope with stress.  You have more symptoms than before. Seek medical care if you have any of the following symptoms for more than two weeks after your injury:  Lasting (chronic) headaches.  Dizziness or balance problems.  Nausea.  Vision problems.  Increased sensitivity to noise or light.  Depression or mood swings.  Anxiety or irritability.  Memory problems.  Difficulty concentrating or paying attention.  Sleep problems.  Feeling tired all the time. SEEK IMMEDIATE MEDICAL CARE IF:  You have confusion or unusual drowsiness.  Others find it difficult to wake you up.  You have nausea or persistent, forceful vomiting.  You feel like you are moving when you are not (vertigo). Your eyes may move rapidly back and forth.  You have convulsions or faint.  You have severe, persistent headaches that are not relieved by medicine.  You cannot use your arms or legs normally.  One of your pupils is larger than the other.  You have clear or bloody discharge from your nose or ears.  Your problems are getting worse, not better. MAKE SURE YOU:  Understand these instructions.  Will watch your condition.  Will get help right away if you are not doing well or get worse.   This information  is not intended to replace advice given to you by your health care provider. Make sure you discuss any questions you have with your health care provider.   Document Released: 06/09/2001 Document Revised: 01/08/2014 Document  Reviewed: 03/25/2013 Elsevier Interactive Patient Education [employer]

## 2015-05-09 NOTE — ED Notes (Signed)
Pt. At nurses station stating she needs to stretch her legs. Pt. States room is hot, this RN adjusted room temperature. Pt. Assisted to bathroom by NT. Pt. Returned to room and given menu to place order for lunch.
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
## 2015-05-09 NOTE — ED Notes (Signed)
Per psychiatry, pt is "not manic, but having memory problems", and he recommends Neuro consult. Dr. Deretha EmoryZackowski notified of this, will assess again shortly.

## 2015-05-09 NOTE — ED Notes (Signed)
Pt. Standing at doorway. RN redirected pt. Back to bed, pt. Steady on feet, no complaints of dizziness. Pt. Appearing AxO x4 at this time. Pt. Sat up and assisted with setup for meal.

## 2015-05-09 NOTE — ED Notes (Signed)
Breakfast ordered 

## 2015-05-09 NOTE — ED Notes (Signed)
Pt frequently leaving room, wanting to walk outside and saying "I want to leave. I'm tired of these 4 walls". Went on a few walks around [place] with staff, now in room eating [place] sandwich.
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
## 2015-05-09 NOTE — ED Notes (Signed)
Placed order for pt. tray 

## 2015-05-09 NOTE — ED Notes (Signed)
Patient standing at desk at this time. 

## 2015-08-06 ENCOUNTER — Ambulatory Visit (HOSPITAL_COMMUNITY)
Admission: EM | Admit: 2015-08-06 | Discharge: 2015-08-06 | Disposition: A | Discharge status: Home / Self Care | Payer: No Typology Code available for payment source | Attending: Emergency Medicine | Admitting: Emergency Medicine

## 2015-08-06 ENCOUNTER — Encounter (HOSPITAL_COMMUNITY): Payer: Self-pay | Admitting: Emergency Medicine

## 2015-08-06 DIAGNOSIS — T7840XA Allergy, unspecified, initial encounter: Secondary | ICD-10-CM | POA: Insufficient documentation

## 2015-08-06 DIAGNOSIS — X58XXXA Exposure to other specified factors, initial encounter: Secondary | ICD-10-CM | POA: Insufficient documentation

## 2015-08-06 DIAGNOSIS — F17210 Nicotine dependence, cigarettes, uncomplicated: Secondary | ICD-10-CM | POA: Insufficient documentation

## 2015-08-06 LAB — POCT URINALYSIS DIP (DEVICE)
BILIRUBIN URINE: NEGATIVE
GLUCOSE, UA: NEGATIVE mg/dL
Ketones, ur: NEGATIVE mg/dL
NITRITE: NEGATIVE
Protein, ur: 30 mg/dL — AB
Specific Gravity, Urine: >=1.03 (ref 1.005–1.030)
Urobilinogen, UA: 0.2 mg/dL (ref 0.0–1.0)
pH: 5.5 (ref 5.0–8.0)

## 2015-08-06 LAB — POCT PREGNANCY, URINE: PREG TEST UR: NEGATIVE

## 2015-08-06 MED ORDER — FLUCONAZOLE 200 MG PO TABS: 200.0000 mg | ORAL_TABLET | Freq: Every day | ORAL | 0 refills | Status: AC

## 2015-08-06 NOTE — Discharge Instructions (Signed)
DO NOT USE PERFUME TYPE BODY CLEANSERS IN YOUR VAGINAL AREA.

## 2015-08-06 NOTE — ED Triage Notes (Signed)
The patient presented to the [facility] with a complaint of labial swelling x 2 days. The patient denied any discharge or dysuria.

## 2015-08-06 NOTE — ED Provider Notes (Signed)
CSN: [id number]     Arrival date & time 08/06/15  1233 History   First MD Initiated Contact with Patient 08/06/15 1332     Chief Complaint  Patient presents with  . Groin Swelling   (Consider location/radiation/quality/duration/timing/severity/associated sxs/prior Treatment) HPI history is obtained with female chaperone present Patient is a 20 year old female with a 2-3 day history of labia swelling of her vagina. She states that she has been using a new perfume soap and thinks she may be having a reaction to this. She is also concerned that she may have a vaginal infection. Patient states that she last had sexual intercourse about 4 or 5 days ago just before the swelling in her vagina started. She states that she does not use condoms because does not enjoy the sensation of a condom and she feels that it causes a rash. Patient then explained that she is particularly about who she sleeps with. Past Medical History:  Diagnosis Date  . ADHD (attention deficit hyperactivity disorder)   . Anaphylactic reaction   . Bipolar 1 disorder ([facility])   . Chronic back pain   . Migraine   . Sleep disorder    History reviewed. No pertinent surgical history. History reviewed. No pertinent family history. Social History  Substance Use Topics  . Smoking status: Current Every Day Smoker    Packs/day: 0.50    Types: Cigarettes  . Smokeless tobacco: Not on file  . Alcohol use No   OB History    Gravida Para Term Preterm AB Living   0 0 0 0 0     SAB TAB Ectopic Multiple Live Births   0 0 0         Review of Systems  Denies: HEADACHE, NAUSEA, ABDOMINAL PAIN, CHEST PAIN, CONGESTION, DYSURIA, SHORTNESS OF BREATH  Allergies  Mushroom extract complex; Fish allergy; Lime [lime, sulfurated]; and Spinach  Home Medications   Prior to Admission medications   Medication Sig Start Date End Date Taking? Authorizing Provider  diphenhydrAMINE (BENADRYL) 25 MG tablet Take 1 tablet (25 mg total) by mouth every 6  (six) hours. 07/05/13   Blake Divine, MD  EPINEPHrine (EPIPEN) 0.3 mg/0.3 mL IJ SOAJ injection Inject 0.3 mLs (0.3 mg total) into the muscle as needed. 07/05/13   Blake Divine, MD  fluconazole (DIFLUCAN) 200 MG tablet Take 1 tablet (200 mg total) by mouth daily. 08/06/15 08/13/15  Tharon Aquas, PA  ibuprofen (ADVIL,MOTRIN) 600 MG tablet Take 1 tablet (600 mg total) by mouth 3 (three) times daily after meals. 03/13/15   Loren Racer, MD  methocarbamol (ROBAXIN) 500 MG tablet Take 2 tablets (1,000 mg total) by mouth every 8 (eight) hours as needed for muscle spasms. 03/13/15   Loren Racer, MD   Meds Ordered and Administered this Visit  Medications - No data to display  BP 124/84 (BP Location: Left Arm)   Pulse 62   Temp 98.7 F (37.1 C) (Oral)   Resp 18   LMP 07/25/2015 (Exact Date)   SpO2 100%  No data found.   Physical Exam NURSES NOTES AND VITAL SIGNS REVIEWED. CONSTITUTIONAL: Well developed, well nourished, no acute distress HEENT: normocephalic, atraumatic, right and left TM's are normal EYES: Conjunctiva normal NECK:normal ROM, supple, no adenopathy PULMONARY:No respiratory distress, normal effort Exam is performed with patient's permission Female nursing staff present to chaperone.  Perineum: clean, dry without lesions, no groin or inguinal Lymphadenopathy; urethra . No caruncle or prolapse noted. The labia are swollen and inflamed. There are  no signs of infection. There is a white thick discharge noted in the fornix.    Urgent Care Course   Clinical Course    Procedures (including critical care time)  Labs Review Labs Reviewed  POCT URINALYSIS DIP (DEVICE) - Abnormal; Notable for the following:       Result Value   Hgb urine dipstick TRACE (*)    Protein, ur 30 (*)    Leukocytes, UA LARGE (*)    All other components within normal limits  POCT PREGNANCY, URINE  CERVICOVAGINAL ANCILLARY ONLY    Imaging Review No results found.   Visual Acuity  Review  Right Eye Distance:   Left Eye Distance:   Bilateral Distance:    Right Eye Near:   Left Eye Near:    Bilateral Near:        Prescription for Diflucan is provided. Patient is also advised not to use perfume type soaps in her vaginal area. MDM   1. Allergic reaction, initial encounter     Patient is reassured that there are no issues that require transfer to higher level of care at this time or additional tests. Patient is advised to continue home symptomatic treatment. Patient is advised that if there are new or worsening symptoms to attend the emergency department, contact primary care provider, or return to UC. Instructions of care provided discharged home in stable condition.    THIS NOTE WAS GENERATED USING A VOICE RECOGNITION SOFTWARE PROGRAM. ALL REASONABLE EFFORTS  WERE MADE TO PROOFREAD THIS DOCUMENT FOR ACCURACY.  I have verbally reviewed the discharge instructions with the patient. A printed AVS was given to the patient.  All questions were answered prior to discharge.      Tharon Aquas, PA 08/06/15 1440

## 2015-08-09 LAB — CERVICOVAGINAL ANCILLARY ONLY: Wet Prep (BD Affirm): POSITIVE — AB

## 2015-08-13 ENCOUNTER — Telehealth (HOSPITAL_COMMUNITY): Payer: Self-pay | Admitting: Emergency Medicine

## 2015-08-13 NOTE — Telephone Encounter (Signed)
Per Dr. Dayton ScrapeMurray,  Notes Recorded by Eustace MooreLaura W Murray, MD on 08/10/2015 at 2:19 PM EDT Clinical staff, please let patient know that tests for candida (yeast) and gardnerella (bacterial vaginosis) were positive.  [facility]  Bacterial vaginosis only needs to be treated if patient is still having vaginal discharge/discomfort.  If vaginal discharge/discomfort are present, would send rx for metronidazole 500mg  bid x 7d #14 no refills.  Recheck for further evaluation if symptoms persist. LM  LM on pt's VM [phone] Need to give lab results from recent visit on 8/5 Also let pt know labs can be obtained from MyChart

## 2016-02-04 ENCOUNTER — Inpatient Hospital Stay
Admit: 2016-02-04 | Discharge: 2016-02-04 | Disposition: A | Discharge status: Home / Self Care | Payer: Self-pay | Attending: Emergency Medicine

## 2016-02-04 DIAGNOSIS — A6000 Herpesviral infection of urogenital system, unspecified: Secondary | ICD-10-CM

## 2016-02-04 LAB — URINALYSIS W/ RFLX MICROSCOPIC
Bilirubin: NEGATIVE
Blood: NEGATIVE
Glucose: NEGATIVE mg/dL
Nitrites: NEGATIVE
Protein: NEGATIVE mg/dL
Specific gravity: 1.028 (ref 1.005–1.030)
Urobilinogen: 1 EU/dL (ref 0.2–1.0)
pH (UA): 5.5 (ref 5.0–8.0)

## 2016-02-04 LAB — WET PREP
Wet prep: NONE SEEN
Wet prep: NONE SEEN

## 2016-02-04 LAB — URINE MICROSCOPIC ONLY
Bacteria: NEGATIVE /hpf
RBC: NEGATIVE /hpf (ref 0–5)
WBC: 21 /hpf (ref 0–4)

## 2016-02-04 LAB — HCG URINE, QL: HCG urine, QL: NEGATIVE

## 2016-02-04 MED ORDER — LIDOCAINE 2 % MUCOSAL GEL
2 % | Status: DC | PRN
Start: 2016-02-04 — End: 2016-02-04

## 2016-02-04 MED ORDER — CEPHALEXIN 500 MG CAP
500 mg | ORAL_CAPSULE | Freq: Two times a day (BID) | ORAL | 0 refills | Status: AC
Start: 2016-02-04 — End: 2016-02-11

## 2016-02-04 MED ORDER — VALACYCLOVIR 500 MG TAB
500 mg | ORAL | Status: DC
Start: 2016-02-04 — End: 2016-02-04

## 2016-02-04 MED ORDER — AZITHROMYCIN 250 MG TAB
250 mg | ORAL | Status: AC
Start: 2016-02-04 — End: 2016-02-04
  Administered 2016-02-04: 17:00:00 via ORAL

## 2016-02-04 MED ORDER — ACYCLOVIR 400 MG TAB
400 mg | ORAL_TABLET | Freq: Three times a day (TID) | ORAL | 0 refills | Status: AC
Start: 2016-02-04 — End: 2016-02-14

## 2016-02-04 MED ORDER — LIDOCAINE 2 % MUCOSAL GEL
2 % | 2 refills | Status: AC
Start: 2016-02-04 — End: ?

## 2016-02-04 MED ORDER — ONDANSETRON 8 MG TAB, RAPID DISSOLVE
8 mg | ORAL | Status: AC
Start: 2016-02-04 — End: 2016-02-04
  Administered 2016-02-04: 18:00:00 via ORAL

## 2016-02-04 MED ORDER — LIDOCAINE 2 % MUCOSAL GEL
2 % | Status: AC
Start: 2016-02-04 — End: 2016-02-04
  Administered 2016-02-04: 16:00:00

## 2016-02-04 MED ORDER — ACYCLOVIR 200 MG CAP
200 mg | ORAL | Status: AC
Start: 2016-02-04 — End: 2016-02-04
  Administered 2016-02-04: 17:00:00 via ORAL

## 2016-02-04 MED ORDER — CEFTRIAXONE 250 MG SOLUTION FOR INJECTION
250 mg | Freq: Once | INTRAMUSCULAR | Status: AC
Start: 2016-02-04 — End: 2016-02-04
  Administered 2016-02-04: 17:00:00 via INTRAMUSCULAR

## 2016-02-04 MED FILL — ACYCLOVIR 200 MG CAP: 200 mg | ORAL | Qty: 4

## 2016-02-04 MED FILL — ONDANSETRON 8 MG TAB, RAPID DISSOLVE: 8 mg | ORAL | Qty: 1

## 2016-02-04 MED FILL — CEFTRIAXONE 250 MG SOLUTION FOR INJECTION: 250 mg | INTRAMUSCULAR | Qty: 250

## 2016-02-04 MED FILL — AZITHROMYCIN 250 MG TAB: 250 mg | ORAL | Qty: 4

## 2016-02-04 MED FILL — LIDOCAINE 2 % MUCOSAL GEL: 2 % | Qty: 5

## 2016-02-04 NOTE — ED Notes (Signed)
Megan Buck is a 21 y.o. female that was discharged in stable condition.  The patients diagnosis, condition and treatment were explained to  patient and aftercare instructions were given.  The patient verbalized understanding. Patient armband removed and shredded.

## 2016-02-04 NOTE — ED Triage Notes (Signed)
P tc/o vaginal pain and swelling ager having intercourse 2 days ago. States her pain is so bad that she in able to urinate.

## 2016-02-04 NOTE — Progress Notes (Signed)
Discussed results with pt. Already treated for hsv.

## 2016-02-04 NOTE — ED Provider Notes (Signed)
EMERGENCY DEPARTMENT HISTORY AND PHYSICAL EXAM    Date: 02/04/2016  Patient Name: Megan Buck    History of Presenting Illness     Chief Complaint   Patient presents with   ??? Vaginal Pain         History Provided By: Patient    Chief Complaint: vaginal pain  Duration: 2 Days  Timing:  Acute  Location: vagina  Quality: Burning  Severity: Severe  Modifying Factors: urination makes pain worse  Associated Symptoms: vaginal discharge, odor      Additional History (Context): Megan Buck is a 21 y.o. female with No significant past medical history who presents with vaginal pain x 2 days. States symptoms onset after intercourse. Pain with walking, urination.     PCP: None    Current Facility-Administered Medications   Medication Dose Route Frequency Provider Last Rate Last Dose   ??? lidocaine (XYLOCAINE) 2 % jelly   Mucous Membrane PRN Joanna Hews, PA         Current Outpatient Prescriptions   Medication Sig Dispense Refill   ??? acyclovir (ZOVIRAX) 400 mg tablet Take 1 Tab by mouth three (3) times daily for 10 days. 30 Tab 0   ??? lidocaine (XYLOCAINE) 2 % jelly Apply to affected area every 2 hours prn 30 mL 2   ??? cephALEXin (KEFLEX) 500 mg capsule Take 1 Cap by mouth two (2) times a day for 7 days. 14 Cap 0       Past History     Past Medical History:  Past Medical History:   Diagnosis Date   ??? Anaphylaxis        Past Surgical History:  History reviewed. No pertinent surgical history.    Family History:  History reviewed. No pertinent family history.    Social History:  Social History   Substance Use Topics   ??? Smoking status: Current Every Day Smoker   ??? Smokeless tobacco: Never Used   ??? Alcohol use Yes      Comment: occasionally       Allergies:  Allergies   Allergen Reactions   ??? Mushroom Anaphylaxis   ??? Lime Other (comments)     Pt states skin dicoloration         Review of Systems   Review of Systems   Constitutional: Negative for fever.   HENT: Negative.     Gastrointestinal: Negative for abdominal pain, nausea and vomiting.   Genitourinary: Positive for dysuria, vaginal discharge and vaginal pain.   All other systems reviewed and are negative.    All Other Systems Negative  Physical Exam     Vitals:    02/04/16 1032   BP: (!) 130/102   Pulse: (!) 104   Resp: 20   Temp: 98.6 ??F (37 ??C)   SpO2: 99%   Weight: 86.2 kg (190 lb)   Height: 5\' 5"  (1.651 m)     Physical Exam   Constitutional: She appears well-developed and well-nourished. No distress.   HENT:   Head: Normocephalic and atraumatic.   Right Ear: External ear normal.   Left Ear: External ear normal.   Mouth/Throat: Oropharynx is clear and moist.   Eyes: Conjunctivae are normal.   Neck: Normal range of motion. Neck supple.   Cardiovascular: Normal rate and regular rhythm.    Pulmonary/Chest: Effort normal and breath sounds normal.   Abdominal: Soft. Bowel sounds are normal. There is no tenderness.   Genitourinary:       There is  erythema and tenderness in the vagina.   Musculoskeletal: Normal range of motion.   Neurological: She is alert.   Skin: Skin is warm. She is not diaphoretic.   Psychiatric: She has a normal mood and affect.         Diagnostic Study Results     Labs -     Recent Results (from the past 12 hour(s))   WET PREP    Collection Time: 02/04/16 11:05 AM   Result Value Ref Range    Special Requests: NO SPECIAL REQUESTS      Wet prep RARE  CLUE CELLS PRESENT        Wet prep NO TRICHOMONAS SEEN      Wet prep NO YEAST SEEN     URINALYSIS W/ RFLX MICROSCOPIC    Collection Time: 02/04/16 11:45 AM   Result Value Ref Range    Color YELLOW      Appearance CLEAR      Specific gravity 1.028 1.005 - 1.030      pH (UA) 5.5 5.0 - 8.0      Protein NEGATIVE  NEG mg/dL    Glucose NEGATIVE  NEG mg/dL    Ketone TRACE (A) NEG mg/dL    Bilirubin NEGATIVE  NEG      Blood NEGATIVE  NEG      Urobilinogen 1.0 0.2 - 1.0 EU/dL    Nitrites NEGATIVE  NEG      Leukocyte Esterase MODERATE (A) NEG     HCG URINE, QL     Collection Time: 02/04/16 11:45 AM   Result Value Ref Range    HCG urine, QL NEGATIVE  NEG     URINE MICROSCOPIC ONLY    Collection Time: 02/04/16 11:45 AM   Result Value Ref Range    WBC 21 to 30 0 - 4 /hpf    RBC NEGATIVE  0 - 5 /hpf    Epithelial cells 1+ 0 - 5 /lpf    Bacteria NEGATIVE  NEG /hpf    Mucus FEW (A) NEG /lpf       Radiologic Studies -   No orders to display     CT Results  (Last 48 hours)    None        CXR Results  (Last 48 hours)    None            Medical Decision Making   I am the first provider for this patient.    I reviewed the vital signs, available nursing notes, past medical history, past surgical history, family history and social history.    Vital Signs-Reviewed the patient's vital signs.      Pulse Oximetry Analysis - 99% on RA    Procedures:  Pelvic Exam  Date/Time: 02/04/2016 11:10 AM  Performed by: PA  Procedure duration:  5 minutes.  Exam assisted by:  Norberta KeensJulie RN.  External genitalia appearance: swelling, lesions and ulcers (multiple exquisitely tender ulcerations).    Vaginal exam:  discharge.    The amount of discharge was:  pooling.  The discharge was malodorous, grey, thick, copious and white.   Specimen(s) collected:  chlamydia, GC and vaginal culture.  Comments: Pt unable to tolerate speculum or bimanual exam due to exquisitely tender lesions on labia          Provider Notes (Medical Decision Making):   Pt presents c/o vaginal pain. Exam with obvious herpes lesions. Pt not tolerant of pelvic exam, swabs taken via vaginal specimen. Lengthy education and  comfort given to pt as she is very upset and this is her first outbreak.    Pt empirically treated for gonorrhea, chlamydia. Pt advised to f/u with health dept for further treatment of std (HIV, syphilis, hepatitis, etc).    Pts phone # [phone]    MED RECONCILIATION:  Current Facility-Administered Medications   Medication Dose Route Frequency   ??? lidocaine (XYLOCAINE) 2 % jelly   Mucous Membrane PRN      Current Outpatient Prescriptions   Medication Sig   ??? acyclovir (ZOVIRAX) 400 mg tablet Take 1 Tab by mouth three (3) times daily for 10 days.   ??? lidocaine (XYLOCAINE) 2 % jelly Apply to affected area every 2 hours prn   ??? cephALEXin (KEFLEX) 500 mg capsule Take 1 Cap by mouth two (2) times a day for 7 days.       Disposition:  discharged    DISCHARGE NOTE:   Pt has been reexamined. Pt feeling better, calm. Patient has no new complaints, changes, or physical findings. Care plan outlined and precautions discussed.  Results of labs were reviewed with the patient. All medications were reviewed with the patient; will d/c home with rx for acyclovir, lidocaine gel. All of pt's questions and concerns were addressed. Patient was instructed and agrees to follow up with [place] [facility] for further testing, as well as to return to the ED upon further deterioration. Patient is ready to go home.    Follow-up Information     Follow up With Details Comments Contact Info     [facility]  for further testing [address]  [place] [address]  [phone]          Current Discharge Medication List      START taking these medications    Details   acyclovir (ZOVIRAX) 400 mg tablet Take 1 Tab by mouth three (3) times daily for 10 days.  Qty: 30 Tab, Refills: 0      lidocaine (XYLOCAINE) 2 % jelly Apply to affected area every 2 hours prn  Qty: 30 mL, Refills: 2      cephALEXin (KEFLEX) 500 mg capsule Take 1 Cap by mouth two (2) times a day for 7 days.  Qty: 14 Cap, Refills: 0                   Diagnosis     Clinical Impression:   1. Herpes genitalis in women    2. Urinary tract infection, acute

## 2016-02-08 LAB — CHLAMYDIA/NEISSERIA AMPLIFICATION
Chlamydia amplification: NEGATIVE
N. gonorrhoeae amplification: NEGATIVE

## 2016-02-09 LAB — HSV 1 AND 2 BY PCR
HSV-1 DNA by PCR: POSITIVE — AB
HSV-2 DNA by PCR: NEGATIVE

## 2016-03-10 ENCOUNTER — Emergency Department (HOSPITAL_COMMUNITY)
Admission: EM | Admit: 2016-03-10 | Discharge: 2016-03-10 | Disposition: A | Discharge status: Home / Self Care | Payer: No Typology Code available for payment source | Attending: Emergency Medicine | Admitting: Emergency Medicine

## 2016-03-10 DIAGNOSIS — Y999 Unspecified external cause status: Secondary | ICD-10-CM | POA: Insufficient documentation

## 2016-03-10 DIAGNOSIS — Y939 Activity, unspecified: Secondary | ICD-10-CM | POA: Insufficient documentation

## 2016-03-10 DIAGNOSIS — X500XXA Overexertion from strenuous movement or load, initial encounter: Secondary | ICD-10-CM | POA: Insufficient documentation

## 2016-03-10 DIAGNOSIS — M545 Low back pain, unspecified: Secondary | ICD-10-CM

## 2016-03-10 DIAGNOSIS — Y929 Unspecified place or not applicable: Secondary | ICD-10-CM | POA: Insufficient documentation

## 2016-03-10 DIAGNOSIS — G8929 Other chronic pain: Secondary | ICD-10-CM | POA: Insufficient documentation

## 2016-03-10 DIAGNOSIS — F17210 Nicotine dependence, cigarettes, uncomplicated: Secondary | ICD-10-CM | POA: Insufficient documentation

## 2016-03-10 DIAGNOSIS — F909 Attention-deficit hyperactivity disorder, unspecified type: Secondary | ICD-10-CM | POA: Insufficient documentation

## 2016-03-10 DIAGNOSIS — M546 Pain in thoracic spine: Secondary | ICD-10-CM | POA: Insufficient documentation

## 2016-03-10 MED ORDER — CYCLOBENZAPRINE HCL 10 MG PO TABS: 10.0000 mg | ORAL_TABLET | Freq: Two times a day (BID) | ORAL | 0 refills | Status: DC | PRN

## 2016-03-10 MED ORDER — IBUPROFEN 800 MG PO TABS: 800.0000 mg | ORAL_TABLET | Freq: Three times a day (TID) | ORAL | 0 refills | Status: DC

## 2016-03-10 NOTE — ED Triage Notes (Signed)
Pt. Stated, I helped my neighbor move and my whole back went out.

## 2016-03-10 NOTE — Discharge Instructions (Signed)
Please take Ibuprofen 800mg  three times a day Take muscle relaxer as needed Make appointment with back doctor and physical therapy clinic

## 2016-03-10 NOTE — ED Provider Notes (Signed)
[facility]-EMERGENCY DEPT Provider Note   CSN: [id number] Arrival date & time: 03/10/16  1430   By signing my name below, I, Soijett Blue, attest that this documentation has been prepared under the direction and in the presence of Bethel BornKelly Marie Erva Koke, PA-C Electronically Signed: Soijett Blue, ED Scribe. 03/10/16. 4:44 PM.  History   Chief Complaint Chief Complaint  Patient presents with  . Back Pain    HPI Robyn Cummings is a 21 y.o. female with a PMHx of chronic back pain, who presents to the Emergency Department complaining of gradual onset upper, middle, and lower back pain x 2 days ago after helping her neighbor moves. Pt has tried 1000 mg Ibuprofen daily, excedrin, and marijuana with no relief of her symptoms. Denies being evaluated by a spine specialist in the past due to lack of insurance. Pt denies numbness, tingling, saddle anesthesia, bowel/bladder incontinence, weakness, fever, chills, recent fall, and any other symptoms. Denies IV drug use. She states she has been told she is having muscle spasms in the past but is adament that they are not muscle spasms.   The history is provided by the patient. No language interpreter was used.    Past Medical History:  Diagnosis Date  . ADHD (attention deficit hyperactivity disorder)   . Anaphylactic reaction   . Bipolar 1 disorder (HCC)   . Chronic back pain   . Migraine   . Sleep disorder     Patient Active Problem List   Diagnosis Date Noted  . 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 Cannabis abuse 05/09/2015    No past surgical history on file.  OB History    Gravida Para Term Preterm AB Living   0 0 0 0 0     SAB TAB Ectopic Multiple Live Births   0 0 0           Home Medications    Prior to Admission medications   Medication Sig Start Date End Date Taking? Authorizing Provider  diphenhydrAMINE (BENADRYL) 25 MG tablet Take 1 tablet (25 mg total) by mouth every 6 (six) hours. 07/05/13   Blake DivineJohn Wofford, MD  EPINEPHrine  (EPIPEN) 0.3 mg/0.3 mL IJ SOAJ injection Inject 0.3 mLs (0.3 mg total) into the muscle as needed. 07/05/13   Blake DivineJohn Wofford, MD  ibuprofen (ADVIL,MOTRIN) 600 MG tablet Take 1 tablet (600 mg total) by mouth 3 (three) times daily after meals. 03/13/15   Loren Raceravid Yelverton, MD  methocarbamol (ROBAXIN) 500 MG tablet Take 2 tablets (1,000 mg total) by mouth every 8 (eight) hours as needed for muscle spasms. 03/13/15   Loren Raceravid Yelverton, MD    Family History No family history on file.  Social History Social History  Substance Use Topics  . Smoking status: Current Every Day Smoker    Packs/day: 0.50    Types: Cigarettes  . Smokeless tobacco: Not on file  . Alcohol use No     Allergies   Mushroom extract complex; Fish allergy; Lime [lime, sulfurated]; and Spinach   Review of Systems Review of Systems  Constitutional: Negative for chills and fever.  Musculoskeletal: Positive for back pain (upper and lower).  Neurological: Negative for weakness and numbness.       No tingling     Physical Exam Updated Vital Signs BP 136/87 (BP Location: Left Arm)   Pulse 90   Temp 97.9 F (36.6 C) (Oral)   Resp 24   LMP 02/29/2016   SpO2 100%   Physical Exam  Constitutional: She is oriented to  person, place, and time. She appears well-developed and well-nourished. No distress.  HENT:  Head: Normocephalic and atraumatic.  Eyes: EOM are normal.  Neck: Neck supple.  Cardiovascular: Normal rate.   Pulmonary/Chest: Effort normal. No respiratory distress.  Abdominal: She exhibits no distension.  Musculoskeletal: Normal range of motion.  Inspection: No masses, deformity, or rash Palpation: Diffuse TTP and pain out of proportion to exam.  ROM: Normal flexion, extension, lateral rotation and flexion of back.  Strength: 5/5 in lower extremities and normal plantar and dorsiflexion Sensation: Intact sensation with light touch in lower extremities bilaterally Reflexes: Patellar reflex is 2+ bilaterally SLR:  Negative seated straight leg raise Gait: Normal gait  Neurological: She is alert and oriented to person, place, and time.  Skin: Skin is warm and dry.  Psychiatric: She has a normal mood and affect. Her behavior is normal.  Nursing note and vitals reviewed.    ED Treatments / Results  DIAGNOSTIC STUDIES: Oxygen Saturation is 100% on RA, nl by my interpretation.    COORDINATION OF CARE: 4:41 PM Discussed treatment plan with pt at bedside which includes ibuprofen and pt agreed to plan.   Procedures Procedures (including critical care time)  Medications Ordered in ED Medications - No data to display   Initial Impression / Assessment and Plan / ED Course  I have reviewed the triage vital signs and the nursing notes.  Patient with acute on chronic back pain.  No neurological deficits and normal neuro exam.  Patient is ambulatory.  No loss of bowel or bladder control.  No concern for cauda equina.  No fever, night sweats, weight loss, h/o cancer, IVDA, no recent procedure to back. No urinary symptoms suggestive of UTI.  Will be discharged with ibuprofen and flexeril. Supportive care and return precaution discussed. Referral to neurosurgery and free PT clinic given. Appears safe for discharge at this time. Follow up as indicated in discharge paperwork.   Final Clinical Impressions(s) / ED Diagnoses   Final diagnoses:  Chronic bilateral low back pain without sciatica  Chronic bilateral thoracic back pain    New Prescriptions Discharge Medication List as of 03/10/2016  4:46 PM    START taking these medications   Details  cyclobenzaprine (FLEXERIL) 10 MG tablet Take 1 tablet (10 mg total) by mouth 2 (two) times daily as needed for muscle spasms., Starting Sat 03/10/2016, Print       I personally performed the services described in this documentation, which was scribed in my presence. The recorded information has been reviewed and is accurate.     Bethel Born, PA-C 03/10/16  2006    Eber Hong, MD 03/11/16 1054

## 2016-04-18 ENCOUNTER — Emergency Department (HOSPITAL_COMMUNITY)
Admission: EM | Admit: 2016-04-18 | Discharge: 2016-04-18 | Disposition: A | Discharge status: Home / Self Care | Payer: No Typology Code available for payment source | Attending: Emergency Medicine | Admitting: Emergency Medicine

## 2016-04-18 ENCOUNTER — Encounter (HOSPITAL_COMMUNITY): Payer: Self-pay | Admitting: Emergency Medicine

## 2016-04-18 DIAGNOSIS — J111 Influenza due to unidentified influenza virus with other respiratory manifestations: Secondary | ICD-10-CM | POA: Insufficient documentation

## 2016-04-18 DIAGNOSIS — Z79899 Other long term (current) drug therapy: Secondary | ICD-10-CM | POA: Insufficient documentation

## 2016-04-18 DIAGNOSIS — F17210 Nicotine dependence, cigarettes, uncomplicated: Secondary | ICD-10-CM | POA: Insufficient documentation

## 2016-04-18 DIAGNOSIS — F909 Attention-deficit hyperactivity disorder, unspecified type: Secondary | ICD-10-CM | POA: Insufficient documentation

## 2016-04-18 LAB — COMPREHENSIVE METABOLIC PANEL
ALT: 12 U/L — ABNORMAL LOW (ref 14–54)
ANION GAP: 11 (ref 5–15)
AST: 17 U/L (ref 15–41)
Albumin: 3.9 g/dL (ref 3.5–5.0)
Alkaline Phosphatase: 107 U/L (ref 38–126)
BILIRUBIN TOTAL: 0.5 mg/dL (ref 0.3–1.2)
BUN: 10 mg/dL (ref 6–20)
CHLORIDE: 106 mmol/L (ref 101–111)
CO2: 21 mmol/L — ABNORMAL LOW (ref 22–32)
Calcium: 9.1 mg/dL (ref 8.9–10.3)
Creatinine, Ser: 0.68 mg/dL (ref 0.44–1.00)
Glucose, Bld: 95 mg/dL (ref 65–99)
POTASSIUM: 3.4 mmol/L — AB (ref 3.5–5.1)
Sodium: 138 mmol/L (ref 135–145)
TOTAL PROTEIN: 7.7 g/dL (ref 6.5–8.1)

## 2016-04-18 LAB — URINALYSIS, ROUTINE W REFLEX MICROSCOPIC
GLUCOSE, UA: NEGATIVE mg/dL
KETONES UR: 20 mg/dL — AB
NITRITE: NEGATIVE
Protein, ur: 100 mg/dL — AB
SPECIFIC GRAVITY, URINE: 1.033 — AB (ref 1.005–1.030)
pH: 5 (ref 5.0–8.0)

## 2016-04-18 LAB — CBC
HEMATOCRIT: 37.3 % (ref 36.0–46.0)
HEMOGLOBIN: 12.5 g/dL (ref 12.0–15.0)
MCH: 27.4 pg (ref 26.0–34.0)
MCHC: 33.5 g/dL (ref 30.0–36.0)
MCV: 81.8 fL (ref 78.0–100.0)
Platelets: 242 10*3/uL (ref 150–400)
RBC: 4.56 MIL/uL (ref 3.87–5.11)
RDW: 14.9 % (ref 11.5–15.5)
WBC: 7.4 10*3/uL (ref 4.0–10.5)

## 2016-04-18 LAB — LIPASE, BLOOD: LIPASE: 20 U/L (ref 11–51)

## 2016-04-18 LAB — I-STAT BETA HCG BLOOD, ED (MC, WL, AP ONLY)

## 2016-04-18 MED ORDER — IBUPROFEN 800 MG PO TABS: 800.0000 mg | ORAL_TABLET | Freq: Three times a day (TID) | ORAL | 0 refills | Status: DC | PRN

## 2016-04-18 MED ORDER — GUAIFENESIN ER 1200 MG PO TB12: 1.0000 | ORAL_TABLET | Freq: Two times a day (BID) | ORAL | 0 refills | Status: DC

## 2016-04-18 MED ORDER — PROMETHAZINE-DM 6.25-15 MG/5ML PO SYRP: 5.0000 mL | ORAL_SOLUTION | Freq: Four times a day (QID) | ORAL | 0 refills | Status: DC | PRN

## 2016-04-18 MED ORDER — ONDANSETRON 4 MG PO TBDP
4.0000 mg | ORAL_TABLET | Freq: Once | ORAL | Status: AC | PRN
  Administered 2016-04-18: 4 mg via ORAL
  Filled 2016-04-18: qty 1

## 2016-04-18 MED ORDER — SODIUM CHLORIDE 0.9 % IV BOLUS (SEPSIS)
1000.0000 mL | Freq: Once | INTRAVENOUS | Status: AC
  Administered 2016-04-18: 1000 mL via INTRAVENOUS

## 2016-04-18 NOTE — ED Provider Notes (Signed)
[facility]-EMERGENCY DEPT Provider Note   CSN: [id number] Arrival date & time: 04/18/16  1246     History   Chief Complaint Chief Complaint  Patient presents with  . Emesis  . Cough  . Nasal Congestion    HPI Robyn Cummings is a 21 y.o. female.  HPI Patient presents to the emergency department with Cough, congestion, nausea, body aches and vomiting over the last 4 days.  States she has also had chills.  The patient states that she did not take any medications prior to arrival.  Patient states nothing seems make the condition better or worse.  Patient states that she has not been around anyone sick.The patient denies chest pain, shortness of breath, headache,blurred vision, neck pain,weakness, numbness, dizziness, anorexia, edema, abdominal pain,  diarrhea, rash, back pain, dysuria, hematemesis, bloody stool, near syncope, or syncope. Past Medical History:  Diagnosis Date  . ADHD (attention deficit hyperactivity disorder)   . Anaphylactic reaction   . Bipolar 1 disorder (HCC)   . Chronic back pain   . Migraine   . Sleep disorder     Patient Active Problem List   Diagnosis Date Noted  . 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 Cannabis abuse 05/09/2015    History reviewed. No pertinent surgical history.  OB History    Gravida Para Term Preterm AB Living   0 0 0 0 0     SAB TAB Ectopic Multiple Live Births   0 0 0           Home Medications    Prior to Admission medications   Medication Sig Start Date End Date Taking? Authorizing Provider  cyclobenzaprine (FLEXERIL) 10 MG tablet Take 1 tablet (10 mg total) by mouth 2 (two) times daily as needed for muscle spasms. 03/10/16  Yes Bethel Born, PA-C  diphenhydrAMINE (BENADRYL) 25 MG tablet Take 1 tablet (25 mg total) by mouth every 6 (six) hours. Patient not taking: Reported on 04/18/2016 07/05/13   Blake Divine, MD  EPINEPHrine (EPIPEN) 0.3 mg/0.3 mL IJ SOAJ injection Inject 0.3 mLs (0.3 mg total) into the muscle as  needed. Patient not taking: Reported on 04/18/2016 07/05/13   Blake Divine, MD  ibuprofen (ADVIL,MOTRIN) 800 MG tablet Take 1 tablet (800 mg total) by mouth 3 (three) times daily. Patient not taking: Reported on 04/18/2016 03/10/16   Bethel Born, PA-C  methocarbamol (ROBAXIN) 500 MG tablet Take 2 tablets (1,000 mg total) by mouth every 8 (eight) hours as needed for muscle spasms. Patient not taking: Reported on 04/18/2016 03/13/15   Loren Racer, MD    Family History No family history on file.  Social History Social History  Substance Use Topics  . Smoking status: Current Every Day Smoker    Packs/day: 0.50    Types: Cigarettes  . Smokeless tobacco: Never Used  . Alcohol use No     Allergies   Mushroom extract complex; Fish allergy; Lime [lime, sulfurated]; and Spinach   Review of Systems Review of Systems All other systems negative except as documented in the HPI. All pertinent positives and negatives as reviewed in the HPI.  Physical Exam Updated Vital Signs BP 116/85 (BP Location: Right Arm)   Pulse 68   Temp 98.4 F (36.9 C) (Oral)   Resp 14   Ht 5' 4.5" (1.638 m)   Wt 77.2 kg   LMP 04/17/2016   SpO2 100%   BMI 28.76 kg/m   Physical Exam  Constitutional: She is oriented to person, place, and time.  She appears well-developed and well-nourished. No distress.  HENT:  Head: Normocephalic and atraumatic.  Mouth/Throat: Oropharynx is clear and moist.  Eyes: Pupils are equal, round, and reactive to light.  Neck: Normal range of motion. Neck supple.  Cardiovascular: Normal rate, regular rhythm and normal heart sounds.  Exam reveals no gallop and no friction rub.   No murmur heard. Pulmonary/Chest: Effort normal and breath sounds normal. No respiratory distress. She has no wheezes.  Abdominal: Soft. Bowel sounds are normal. She exhibits no distension. There is no tenderness.  Neurological: She is alert and oriented to person, place, and time. She exhibits normal  muscle tone. Coordination normal.  Skin: Skin is warm and dry. Capillary refill takes less than 2 seconds. No rash noted. No erythema.  Psychiatric: She has a normal mood and affect. Her behavior is normal.  Nursing note and vitals reviewed.    ED Treatments / Results  Labs (all labs ordered are listed, but only abnormal results are displayed) Labs Reviewed  COMPREHENSIVE METABOLIC PANEL - Abnormal; Notable for the following:       Result Value   Potassium 3.4 (*)    CO2 21 (*)    ALT 12 (*)    All other components within normal limits  URINALYSIS, ROUTINE W REFLEX MICROSCOPIC - Abnormal; Notable for the following:    Color, Urine AMBER (*)    APPearance CLOUDY (*)    Specific Gravity, Urine 1.033 (*)    Hgb urine dipstick LARGE (*)    Bilirubin Urine MODERATE (*)    Ketones, ur 20 (*)    Protein, ur 100 (*)    Leukocytes, UA TRACE (*)    Bacteria, UA RARE (*)    Squamous Epithelial / LPF 0-5 (*)    All other components within normal limits  URINE CULTURE  LIPASE, BLOOD  CBC  I-STAT BETA HCG BLOOD, ED ([facility], [facility], [facility] ONLY)    EKG  EKG Interpretation None       Radiology No results found.  Procedures Procedures (including critical care time)  Medications Ordered in ED Medications  ondansetron (ZOFRAN-ODT) disintegrating tablet 4 mg (4 mg Oral Given 04/18/16 1306)  sodium chloride 0.9 % bolus 1,000 mL (0 mLs Intravenous Stopped 04/18/16 1804)     Initial Impression / Assessment and Plan / ED Course  I have reviewed the triage vital signs and the nursing notes.  Pertinent labs & imaging results that were available during my care of the patient were reviewed by me and considered in my medical decision making (see chart for details).    Patient will be treated for influenza based on the fact that she has body aches, chills, cough, nasal congestion, nausea and vomiting.  The patient is advised of the results and all questions were answered.  I did give her IV fluids  here in the emergency department.  She does report feeling some better.  Patient will be advised to increase her fluid intake, rest as much as possible.  Told to return here for any worsening in her condition   Final Clinical Impressions(s) / ED Diagnoses   Final diagnoses:  None    New Prescriptions New Prescriptions   No medications on file     Charlestine Night, PA-C 04/20/16 0037    Mancel Bale, MD 04/20/16 [phone]

## 2016-04-18 NOTE — ED Triage Notes (Signed)
Patient coming in for vomiting, cough, congestion x 4 days.  Patient states that has hot and chills intermittently.

## 2016-04-18 NOTE — Discharge Instructions (Signed)
Return here as needed.  Follow-up with primary doctor, increase her fluid intake and rest as much as possible

## 2016-04-20 LAB — URINE CULTURE: Culture: 30000 — AB

## 2016-04-21 ENCOUNTER — Telehealth: Payer: Self-pay

## 2016-04-21 NOTE — Telephone Encounter (Signed)
No treatment for [facility] 04/18/16 ED visit

## 2017-03-19 ENCOUNTER — Emergency Department (HOSPITAL_COMMUNITY)
Admission: EM | Admit: 2017-03-19 | Discharge: 2017-03-19 | Disposition: A | Discharge status: Home / Self Care | Payer: No Typology Code available for payment source | Attending: Emergency Medicine | Admitting: Emergency Medicine

## 2017-03-19 ENCOUNTER — Emergency Department (HOSPITAL_COMMUNITY): Payer: No Typology Code available for payment source

## 2017-03-19 ENCOUNTER — Other Ambulatory Visit: Payer: Self-pay

## 2017-03-19 ENCOUNTER — Encounter (HOSPITAL_COMMUNITY): Payer: Self-pay | Admitting: *Deleted

## 2017-03-19 DIAGNOSIS — J069 Acute upper respiratory infection, unspecified: Secondary | ICD-10-CM

## 2017-03-19 DIAGNOSIS — B9789 Other viral agents as the cause of diseases classified elsewhere: Secondary | ICD-10-CM

## 2017-03-19 DIAGNOSIS — F17210 Nicotine dependence, cigarettes, uncomplicated: Secondary | ICD-10-CM | POA: Insufficient documentation

## 2017-03-19 DIAGNOSIS — Z79899 Other long term (current) drug therapy: Secondary | ICD-10-CM | POA: Insufficient documentation

## 2017-03-19 MED ORDER — GUAIFENESIN-CODEINE 100-10 MG/5ML PO SOLN: 5.0000 mL | Freq: Every evening | ORAL | 0 refills | Status: DC | PRN

## 2017-03-19 NOTE — Discharge Instructions (Signed)
Your symptoms are likely caused by a viral upper respiratory infection. Antibiotics are not helpful in treating viral infection, the virus should run its course in about 5--7 days. Please make sure you are drinking plenty of fluids. You can treat your symptoms supportively with tylenol/ibuprofen for fevers and pains, Zyrtec and Flonase to heal with nasal congestion, and Mucinex and throat lozenges to help with cough. You may use the cough syrup I've prescribed to help with coughing when you're trying to sleep, this can may you drowsy do not take before work or driving. If your symptoms are not improving please follow up with you Primary doctor.   If you develop persistent fevers, shortness of breath or difficulty breathing, chest pain, severe headache and neck pain, persistent nausea and vomiting or other new or concerning symptoms return to the Emergency department.

## 2017-03-19 NOTE — ED Provider Notes (Signed)
[name]   CSN: [id number] Arrival date & time: 03/19/17  1111     History   Chief Complaint Chief Complaint  Patient presents with  . URI  . Letter for School/Work    HPI Robyn Cummings is a 22 y.o. female.  Robyn Cummings is a 22 y.o. Female history of bipolar disorder, migraines, presents to the ED for evaluation of 4 days of productive cough with associated nasal congestion, rhinorrhea, sore scratchy throat, chills and body aches.  Patient denies any fevers.  She reports cough productive of lots of yellow-green mucus, had one episode with blood streaked mucus present, but no gross hemoptysis.  Patient reports cough is keeping her up from sleep.  Patient denies chest pain or shortness of breath just soreness with coughing.  Since onset symptoms have been constant and worsening.  Patient has tried Mucinex for her symptoms has not tried anything else to treat her pain.  Patient denies any nausea, vomiting, diarrhea or abdominal pain.  No other aggravating or alleviating factors.  Patient has been tolerating food and fluids without difficulty.  Did not receive her flu shot this year, unsure if she is coming contact with anyone with the flu.  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      Past Medical History:  Diagnosis Date  . ADHD (attention deficit hyperactivity disorder)   . Anaphylactic reaction   . Bipolar 1 disorder (HCC)   . Chronic back pain   . Migraine   . Sleep disorder     Patient Active Problem List   Diagnosis Date Noted  . 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 Cannabis abuse 05/09/2015    History reviewed. No pertinent surgical history.  OB History    Gravida Para Term Preterm AB Living   0 0 0 0 0     SAB TAB Ectopic Multiple Live Births   0 0 0           Home Medications    Prior to Admission medications   Medication Sig Start Date End Date Taking?  Authorizing Provider  cyclobenzaprine (FLEXERIL) 10 MG tablet Take 1 tablet (10 mg total) by mouth 2 (two) times daily as needed for muscle spasms. 03/10/16   Bethel BornGekas, Kelly Marie, PA-C  diphenhydrAMINE (BENADRYL) 25 MG tablet Take 1 tablet (25 mg total) by mouth every 6 (six) hours. Patient not taking: Reported on 04/18/2016 07/05/13   Blake DivineWofford, John, MD  EPINEPHrine (EPIPEN) 0.3 mg/0.3 mL IJ SOAJ injection Inject 0.3 mLs (0.3 mg total) into the muscle as needed. Patient not taking: Reported on 04/18/2016 07/05/13   Blake DivineWofford, John, MD  Guaifenesin 1200 MG TB12 Take 1 tablet (1,200 mg total) by mouth 2 (two) times daily. 04/18/16   Lawyer, Cristal Deerhristopher, PA-C  ibuprofen (ADVIL,MOTRIN) 800 MG tablet Take 1 tablet (800 mg total) by mouth every 8 (eight) hours as needed. 04/18/16   Lawyer, Cristal Deerhristopher, PA-C  methocarbamol (ROBAXIN) 500 MG tablet Take 2 tablets (1,000 mg total) by mouth every 8 (eight) hours as needed for muscle spasms. Patient not taking: Reported on 04/18/2016 03/13/15   Loren RacerYelverton, David, MD  promethazine-dextromethorphan (PROMETHAZINE-DM) 6.25-15 MG/5ML syrup Take 5 mLs by mouth 4 (four) times daily as needed for cough. 04/18/16   Charlestine NightLawyer, Christopher, PA-C    Family History History reviewed. No pertinent family history.  Social History Social History   Tobacco Use  . Smoking status: Current Every Day Smoker  Packs/day: 0.50    Types: Cigarettes  . Smokeless tobacco: Never Used  Substance Use Topics  . Alcohol use: No  . Drug use: Yes    Types: Marijuana    Comment: Pt sts "every chance I get"     Allergies   Mushroom extract complex; Fish allergy; Lime [lime, sulfurated]; and Spinach   Review of Systems Review of Systems  Constitutional: Positive for chills. Negative for fever.  HENT: Positive for congestion, postnasal drip, rhinorrhea, sinus pressure, sneezing and sore throat. Negative for ear discharge and ear pain.   Eyes: Negative for discharge, redness and itching.    Respiratory: Positive for cough. Negative for chest tightness, shortness of breath and wheezing.   Cardiovascular: Negative for chest pain.  Gastrointestinal: Negative for abdominal pain, diarrhea, nausea and vomiting.  Genitourinary: Negative for dysuria.  Musculoskeletal: Positive for myalgias. Negative for arthralgias and joint swelling.  Skin: Negative for color change, pallor and rash.  Neurological: Negative for dizziness, light-headedness and headaches.     Physical Exam Updated Vital Signs BP (!) 140/98 (BP Location: Right Arm)   Pulse 84   Temp 98.7 F (37.1 C) (Oral)   Resp 16   LMP 03/12/2017   SpO2 99%   Physical Exam  Constitutional: She appears well-developed and well-nourished. No distress.  HENT:  Head: Normocephalic and atraumatic.  TMs clear with good landmarks, moderate nasal mucosa edema with clear rhinorrhea, posterior oropharynx clear and moist, with some erythema, no edema or exudates, uvula midline, sinuses nontender to palpation  Eyes: Right eye exhibits no discharge. Left eye exhibits no discharge.  Neck: Neck supple.  No rigidity  Cardiovascular: Normal rate, regular rhythm and normal heart sounds.  Pulmonary/Chest: Effort normal and breath sounds normal. No stridor. No respiratory distress. She has no wheezes. She has no rales.  Respirations equal and unlabored, patient able to speak in full sentences, lungs clear to auscultation bilaterally  Abdominal: Soft. Bowel sounds are normal. She exhibits no distension and no mass. There is no tenderness. There is no guarding.  Lymphadenopathy:    She has no cervical adenopathy.  Neurological: She is alert. Coordination normal.  Skin: She is not diaphoretic.  Psychiatric: She has a normal mood and affect. Her behavior is normal.  Nursing note and vitals reviewed.    ED Treatments / Results  Labs (all labs ordered are listed, but only abnormal results are displayed) Labs Reviewed - No data to  display  EKG  EKG Interpretation None       Radiology Dg Chest 2 View  Result Date: 03/19/2017 CLINICAL DATA:  Chest pain and shortness of breath for the past 4 days. EXAM: CHEST - 2 VIEW COMPARISON:  None. FINDINGS: The heart size and mediastinal contours are within normal limits. Both lungs are clear. The visualized skeletal structures are unremarkable. IMPRESSION: Normal chest x-ray. Electronically Signed   By: Obie Dredge M.D.   On: 03/19/2017 13:40    Procedures Procedures (including critical care time)  Medications Ordered in ED Medications - No data to display   Initial Impression / Assessment and Plan / ED Course  I have reviewed the triage vital signs and the nursing notes.  Pertinent labs & imaging results that were available during my care of the patient were reviewed by me and considered in my medical decision making (see chart for details).  Pt presents with nasal congestion and cough. Pt is well appearing and vitals are normal. Lungs CTA on exam. Pt CXR negative for  acute infiltrate. Patients symptoms are consistent with URI, likely viral etiology. Discussed that antibiotics are not indicated for viral infections.  Discussed with patient that while this may be influenza since she has symptoms for greater than 48 hours she is not a candidate for Tamiflu, discussed at length appropriate supportive treatment with the patient.  Pt will be discharged with symptomatic treatment.  Verbalizes understanding and is agreeable with plan. Pt is hemodynamically stable & in NAD prior to dc.  Return precautions discussed.  Work note provided.   Final Clinical Impressions(s) / ED Diagnoses   Final diagnoses:  Viral URI with cough    ED Discharge Orders        Ordered    guaiFENesin-codeine 100-10 MG/5ML syrup  At bedtime PRN     03/19/17 1348       Dartha Lodge, [place] 03/19/17 1354    Cathren Laine, MD 03/19/17 1431

## 2017-03-19 NOTE — ED Notes (Signed)
Pt reports that she only wants a work note. Does not want blood work, meds or IV.

## 2017-03-19 NOTE — ED Notes (Addendum)
Patient reports=> Coughing since Saturday,  took 4 tablets of mucinex with no relief

## 2017-03-19 NOTE — ED Notes (Signed)
Patient transported to X-ray 

## 2017-03-19 NOTE — ED Notes (Signed)
D/c reviewed with teach back. No further question at this time

## 2017-03-19 NOTE — ED Triage Notes (Signed)
Pt reports cough and cold symptoms since Saturday. Has n/v. Denies diarrhea.

## 2017-03-21 ENCOUNTER — Emergency Department (HOSPITAL_COMMUNITY)
Admission: EM | Admit: 2017-03-21 | Discharge: 2017-03-21 | Disposition: A | Discharge status: Home / Self Care | Payer: Self-pay | Attending: Emergency Medicine | Admitting: Emergency Medicine

## 2017-03-21 ENCOUNTER — Encounter (HOSPITAL_COMMUNITY): Payer: Self-pay | Admitting: Emergency Medicine

## 2017-03-21 ENCOUNTER — Emergency Department (HOSPITAL_COMMUNITY): Payer: Self-pay

## 2017-03-21 DIAGNOSIS — R197 Diarrhea, unspecified: Secondary | ICD-10-CM

## 2017-03-21 DIAGNOSIS — B349 Viral infection, unspecified: Secondary | ICD-10-CM | POA: Insufficient documentation

## 2017-03-21 DIAGNOSIS — R112 Nausea with vomiting, unspecified: Secondary | ICD-10-CM

## 2017-03-21 DIAGNOSIS — Z79899 Other long term (current) drug therapy: Secondary | ICD-10-CM | POA: Insufficient documentation

## 2017-03-21 DIAGNOSIS — F17210 Nicotine dependence, cigarettes, uncomplicated: Secondary | ICD-10-CM | POA: Insufficient documentation

## 2017-03-21 LAB — URINALYSIS, ROUTINE W REFLEX MICROSCOPIC
BILIRUBIN URINE: NEGATIVE
GLUCOSE, UA: NEGATIVE mg/dL
HGB URINE DIPSTICK: NEGATIVE
Ketones, ur: 80 mg/dL — AB
NITRITE: NEGATIVE
PH: 5 (ref 5.0–8.0)
Protein, ur: 30 mg/dL — AB
SPECIFIC GRAVITY, URINE: 1.032 — AB (ref 1.005–1.030)

## 2017-03-21 LAB — CBC
HCT: 40.1 % (ref 36.0–46.0)
Hemoglobin: 13.3 g/dL (ref 12.0–15.0)
MCH: 28.6 pg (ref 26.0–34.0)
MCHC: 33.2 g/dL (ref 30.0–36.0)
MCV: 86.2 fL (ref 78.0–100.0)
Platelets: 302 10*3/uL (ref 150–400)
RBC: 4.65 MIL/uL (ref 3.87–5.11)
RDW: 14.4 % (ref 11.5–15.5)
WBC: 8.5 10*3/uL (ref 4.0–10.5)

## 2017-03-21 LAB — COMPREHENSIVE METABOLIC PANEL
ALT: 14 U/L (ref 14–54)
ANION GAP: 11 (ref 5–15)
AST: 18 U/L (ref 15–41)
Albumin: 4.2 g/dL (ref 3.5–5.0)
Alkaline Phosphatase: 109 U/L (ref 38–126)
BILIRUBIN TOTAL: 0.5 mg/dL (ref 0.3–1.2)
BUN: 11 mg/dL (ref 6–20)
CO2: 25 mmol/L (ref 22–32)
Calcium: 9.4 mg/dL (ref 8.9–10.3)
Chloride: 103 mmol/L (ref 101–111)
Creatinine, Ser: 0.64 mg/dL (ref 0.44–1.00)
GFR calc Af Amer: >60 mL/min (ref >60)
Glucose, Bld: 109 mg/dL — ABNORMAL HIGH (ref 65–99)
POTASSIUM: 3.7 mmol/L (ref 3.5–5.1)
Sodium: 139 mmol/L (ref 135–145)
TOTAL PROTEIN: 8 g/dL (ref 6.5–8.1)

## 2017-03-21 LAB — LIPASE, BLOOD: Lipase: 25 U/L (ref 11–51)

## 2017-03-21 LAB — I-STAT BETA HCG BLOOD, ED (MC, WL, AP ONLY): I-stat hCG, quantitative: <5 m[IU]/mL (ref <5)

## 2017-03-21 MED ORDER — ALBUTEROL SULFATE (2.5 MG/3ML) 0.083% IN NEBU
5.0000 mg | INHALATION_SOLUTION | Freq: Once | RESPIRATORY_TRACT | Status: AC
Start: 2017-03-21 — End: 2017-03-21
  Administered 2017-03-21: 5 mg via RESPIRATORY_TRACT
  Filled 2017-03-21: qty 6

## 2017-03-21 MED ORDER — KETOROLAC TROMETHAMINE 30 MG/ML IJ SOLN
30.0000 mg | Freq: Once | INTRAMUSCULAR | Status: AC
Start: 2017-03-21 — End: 2017-03-21
  Administered 2017-03-21: 30 mg via INTRAVENOUS
  Filled 2017-03-21: qty 1

## 2017-03-21 MED ORDER — ONDANSETRON 4 MG PO TBDP: 4.0000 mg | ORAL_TABLET | Freq: Once | ORAL | Status: DC | PRN

## 2017-03-21 MED ORDER — IPRATROPIUM BROMIDE 0.02 % IN SOLN
0.5000 mg | Freq: Once | RESPIRATORY_TRACT | Status: AC
Start: 2017-03-21 — End: 2017-03-21
  Administered 2017-03-21: 0.5 mg via RESPIRATORY_TRACT
  Filled 2017-03-21: qty 2.5

## 2017-03-21 MED ORDER — PROMETHAZINE HCL 25 MG PO TABS: 25.0000 mg | ORAL_TABLET | Freq: Four times a day (QID) | ORAL | 0 refills | Status: DC | PRN

## 2017-03-21 MED ORDER — SODIUM CHLORIDE 0.9 % IV BOLUS (SEPSIS)
1000.0000 mL | Freq: Once | INTRAVENOUS | Status: AC
  Administered 2017-03-21: 1000 mL via INTRAVENOUS

## 2017-03-21 NOTE — ED Provider Notes (Signed)
[facility]-EMERGENCY DEPT Provider Note   CSN: [id number] Arrival date & time: 03/21/17  1231     History   Chief Complaint Chief Complaint  Patient presents with  . Emesis  . Diarrhea    HPI Robyn Cummings is a 22 y.o. female.  HPI   22 year old female with history of bipolar, chronic back pain, chronic headache, cannabis abuse brought here via EMS from home for evaluation of nausea vomiting diarrhea.  Patient report 5 days ago she developed coughing, sneezing, body aches, and flulike symptoms.  She was seen by her primary care provider 2 days later for her symptoms.  She was told that she may have a viral infection and was prescribed some codeine medication.  She took the medication however for the past 2 days she has had persistent nausea, has vomited at least 4 times of nonbloody nonbilious contents and having 5-6 bouts of diarrhea throughout the day today without blood or mucus.  Wheezing she also endorsed and posttussive emesis.  She admits to using marijuana and states she use it daily.  She denies any sick contact she is not up-to-date with flu shot.  She denies any recent travel or any exotic food.  She rates her discomfort as 8 out of 10.  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  Past Medical History:  Diagnosis Date  . ADHD (attention deficit hyperactivity disorder)   . Anaphylactic reaction   . Bipolar 1 disorder (HCC)   . Chronic back pain   . Migraine   . Sleep disorder     Patient Active Problem List   Diagnosis Date Noted  . 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 Cannabis abuse 05/09/2015    History reviewed. No pertinent surgical history.  OB History    Gravida  0   Para  0   Term  0   Preterm  0   AB  0   Living        SAB  0   TAB  0   Ectopic  0   Multiple      Live Births               Home Medications    Prior to Admission medications   Medication Sig Start Date End Date Taking? Authorizing  Provider  cyclobenzaprine (FLEXERIL) 10 MG tablet Take 1 tablet (10 mg total) by mouth 2 (two) times daily as needed for muscle spasms. 03/10/16   Bethel BornGekas, Kelly Marie, PA-C  diphenhydrAMINE (BENADRYL) 25 MG tablet Take 1 tablet (25 mg total) by mouth every 6 (six) hours. Patient not taking: Reported on 04/18/2016 07/05/13   Blake DivineWofford, John, MD  EPINEPHrine (EPIPEN) 0.3 mg/0.3 mL IJ SOAJ injection Inject 0.3 mLs (0.3 mg total) into the muscle as needed. Patient not taking: Reported on 04/18/2016 07/05/13   Blake DivineWofford, John, MD  Guaifenesin 1200 MG TB12 Take 1 tablet (1,200 mg total) by mouth 2 (two) times daily. 04/18/16   Lawyer, Cristal Deerhristopher, PA-C  guaiFENesin-codeine 100-10 MG/5ML syrup Take 5 mLs by mouth at bedtime as needed for cough. 03/19/17   Dartha LodgeFord, Kelsey N, PA-C  ibuprofen (ADVIL,MOTRIN) 800 MG tablet Take 1 tablet (800 mg total) by mouth every 8 (eight) hours as needed. 04/18/16   Lawyer, Cristal Deerhristopher, PA-C  methocarbamol (ROBAXIN) 500 MG tablet Take 2 tablets (1,000 mg total) by mouth every 8 (eight) hours as needed for muscle spasms. Patient not taking: Reported on 04/18/2016 03/13/15   Loren RacerYelverton, David, MD  promethazine-dextromethorphan (PROMETHAZINE-DM)  6.25-15 MG/5ML syrup Take 5 mLs by mouth 4 (four) times daily as needed for cough. 04/18/16   Charlestine Night, PA-C    Family History No family history on file.  Social History Social History   Tobacco Use  . Smoking status: Current Every Day Smoker    Packs/day: 0.50    Types: Cigarettes  . Smokeless tobacco: Never Used  Substance Use Topics  . Alcohol use: No  . Drug use: Yes    Types: Marijuana    Comment: Pt sts "every chance I get"     Allergies   Mushroom extract complex; Fish allergy; Lime [lime, sulfurated]; and Spinach   Review of Systems Review of Systems  All other systems reviewed and are negative.    Physical Exam Updated Vital Signs BP (!) 143/133 (BP Location: Left Arm)   Pulse 95   Temp 98.3 F (36.8 C)  (Oral)   Resp 18   LMP 03/12/2017   SpO2 100%   Physical Exam  Constitutional: She is oriented to person, place, and time. She appears well-developed and well-nourished. No distress.  HENT:  Head: Atraumatic.  Ears: TMs normal bilaterally nose: Normal nares  throat: Uvula midline no tonsillar enlargement or exudates  Eyes: Conjunctivae are normal.  Neck: Neck supple.  No nuchal rigidity  Cardiovascular: Normal rate and regular rhythm.  Pulmonary/Chest: Effort normal. She has wheezes.  Abdominal: Soft. She exhibits no distension. There is tenderness (Mild diffuse abdominal tenderness on palpation without guarding or rebound tenderness.).  Neurological: She is alert and oriented to person, place, and time.  Skin: Skin is warm. No rash noted.  Psychiatric: She has a normal mood and affect.  Nursing note and vitals reviewed.    ED Treatments / Results  Labs (all labs ordered are listed, but only abnormal results are displayed) Labs Reviewed  COMPREHENSIVE METABOLIC PANEL - Abnormal; Notable for the following components:      Result Value   Glucose, Bld 109 (*)    All other components within normal limits  URINALYSIS, ROUTINE W REFLEX MICROSCOPIC - Abnormal; Notable for the following components:   Color, Urine AMBER (*)    APPearance CLOUDY (*)    Specific Gravity, Urine 1.032 (*)    Ketones, ur 80 (*)    Protein, ur 30 (*)    Leukocytes, UA TRACE (*)    Bacteria, UA MANY (*)    Squamous Epithelial / LPF TOO NUMEROUS TO COUNT (*)    All other components within normal limits  LIPASE, BLOOD  CBC  I-STAT BETA HCG BLOOD, ED ([facility], [facility], [facility] ONLY)    EKG  EKG Interpretation None       Radiology Dg Chest 2 View  Result Date: 03/21/2017 CLINICAL DATA:  Productive cough for 1 week EXAM: CHEST - 2 VIEW COMPARISON:  03/19/2017 FINDINGS: The heart size and mediastinal contours are within normal limits. Both lungs are clear. The visualized skeletal structures are unremarkable.  IMPRESSION: No active cardiopulmonary disease. Electronically Signed   By: Jasmine Pang M.D.   On: 03/21/2017 17:11    Procedures Procedures (including critical care time)  Medications Ordered in ED Medications  ondansetron (ZOFRAN-ODT) disintegrating tablet 4 mg (has no administration in time range)     Initial Impression / Assessment and Plan / ED Course  I have reviewed the triage vital signs and the nursing notes.  Pertinent labs & imaging results that were available during my care of the patient were reviewed by me and considered in  my medical decision making (see chart for details).     BP 109/64 (BP Location: Right Arm)   Pulse 85   Temp 98.3 F (36.8 C) (Oral)   Resp 16   LMP 03/07/2017   SpO2 100%    Final Clinical Impressions(s) / ED Diagnoses   Final diagnoses:  Viral illness  Nausea vomiting and diarrhea    ED Discharge Orders        Ordered    promethazine (PHENERGAN) 25 MG tablet  Every 6 hours PRN     03/21/17 1916     4:35 PM Patient here with flulike symptoms.  She also endorsed nausea vomiting diarrhea.  Her sxs is likely related to a viral infection or it could be cannabinol hyperemesis syndrome.  She does have mild generalized abdominal tenderness without focal point tenderness.  Have low suspicion for appendicitis, colitis, or biliary disease at this time.  Will provide symptomatic treatment.  7:17 PM Patient received IV fluid, felt better and request to be discharged.  Her labs are reassuring. Does have 80 ketone in urine, which reflects dehydration.  Encourage pt to drink plenty of fluid.  Take phenergan as needed for nausea.  Return precaution given.    Fayrene Helper, PA-C 03/21/17 1918    Linwood Dibbles, MD 03/24/17 [phone]

## 2017-03-21 NOTE — Discharge Instructions (Addendum)
Please take phenergan as needed for nausea.  Stay hydrated by drinking plenty of fluid.  Return if you have any concerns.

## 2017-03-21 NOTE — ED Notes (Signed)
Patient is being very loud and disruptive, yelling at her boyfriend as well as yelling during her phone conversation. I asked patient to please lower her voice because staff can hear her conversation as well as shut patient's room door, patient was quiet for a little while then started back getting loud and yelling at her boyfriend, loud enough that we can hear her behind a closed door. RN had too step in and ask patient to quiet down again, patient also states she wants the DR because she is ready to be discharged.

## 2017-03-21 NOTE — ED Notes (Signed)
Respiratory contacted regarding treatments

## 2017-03-21 NOTE — ED Triage Notes (Signed)
Per [facility] pt from home for n/v/d for several days.

## 2017-10-27 ENCOUNTER — Emergency Department (HOSPITAL_COMMUNITY): Payer: Self-pay

## 2017-10-27 ENCOUNTER — Encounter (HOSPITAL_COMMUNITY): Payer: Self-pay | Admitting: Emergency Medicine

## 2017-10-27 ENCOUNTER — Emergency Department (HOSPITAL_COMMUNITY)
Admission: EM | Admit: 2017-10-27 | Discharge: 2017-10-27 | Disposition: A | Discharge status: Home / Self Care | Payer: Self-pay | Attending: Emergency Medicine | Admitting: Emergency Medicine

## 2017-10-27 ENCOUNTER — Other Ambulatory Visit: Payer: Self-pay

## 2017-10-27 DIAGNOSIS — J069 Acute upper respiratory infection, unspecified: Secondary | ICD-10-CM | POA: Insufficient documentation

## 2017-10-27 DIAGNOSIS — R1110 Vomiting, unspecified: Secondary | ICD-10-CM

## 2017-10-27 DIAGNOSIS — Z79899 Other long term (current) drug therapy: Secondary | ICD-10-CM | POA: Insufficient documentation

## 2017-10-27 DIAGNOSIS — F17210 Nicotine dependence, cigarettes, uncomplicated: Secondary | ICD-10-CM | POA: Insufficient documentation

## 2017-10-27 DIAGNOSIS — B9789 Other viral agents as the cause of diseases classified elsewhere: Secondary | ICD-10-CM | POA: Insufficient documentation

## 2017-10-27 LAB — COMPREHENSIVE METABOLIC PANEL
ALT: 10 U/L (ref 0–44)
AST: 16 U/L (ref 15–41)
Albumin: 3.5 g/dL (ref 3.5–5.0)
Alkaline Phosphatase: 96 U/L (ref 38–126)
Anion gap: 11 (ref 5–15)
BUN: 10 mg/dL (ref 6–20)
CHLORIDE: 104 mmol/L (ref 98–111)
CO2: 23 mmol/L (ref 22–32)
CREATININE: 0.67 mg/dL (ref 0.44–1.00)
Calcium: 9.2 mg/dL (ref 8.9–10.3)
GFR calc non Af Amer: >60 mL/min (ref >60)
Glucose, Bld: 89 mg/dL (ref 70–99)
Potassium: 3.7 mmol/L (ref 3.5–5.1)
SODIUM: 138 mmol/L (ref 135–145)
Total Bilirubin: 0.3 mg/dL (ref 0.3–1.2)
Total Protein: 6.8 g/dL (ref 6.5–8.1)

## 2017-10-27 LAB — CBC WITH DIFFERENTIAL/PLATELET
Abs Immature Granulocytes: 0.02 10*3/uL (ref 0.00–0.07)
Basophils Absolute: 0 10*3/uL (ref 0.0–0.1)
Basophils Relative: 0 %
Eosinophils Absolute: 0.2 10*3/uL (ref 0.0–0.5)
Eosinophils Relative: 2 %
HEMATOCRIT: 38 % (ref 36.0–46.0)
HEMOGLOBIN: 11.9 g/dL — AB (ref 12.0–15.0)
Immature Granulocytes: 0 %
LYMPHS ABS: 2.2 10*3/uL (ref 0.7–4.0)
LYMPHS PCT: 24 %
MCH: 27.4 pg (ref 26.0–34.0)
MCHC: 31.3 g/dL (ref 30.0–36.0)
MCV: 87.6 fL (ref 80.0–100.0)
MONO ABS: 0.6 10*3/uL (ref 0.1–1.0)
MONOS PCT: 6 %
Neutro Abs: 6.2 10*3/uL (ref 1.7–7.7)
Neutrophils Relative %: 68 %
PLATELETS: 268 10*3/uL (ref 150–400)
RBC: 4.34 MIL/uL (ref 3.87–5.11)
RDW: 14.2 % (ref 11.5–15.5)
WBC: 9.3 10*3/uL (ref 4.0–10.5)
nRBC: 0 % (ref 0.0–0.2)

## 2017-10-27 LAB — I-STAT BETA HCG BLOOD, ED (MC, WL, AP ONLY): I-stat hCG, quantitative: <5 m[IU]/mL (ref <5)

## 2017-10-27 MED ORDER — HYDROCOD POLST-CPM POLST ER 10-8 MG/5ML PO SUER
5.0000 mL | Freq: Once | ORAL | Status: AC
  Administered 2017-10-27: 5 mL via ORAL
  Filled 2017-10-27: qty 5

## 2017-10-27 MED ORDER — ONDANSETRON 4 MG PO TBDP
4.0000 mg | ORAL_TABLET | Freq: Once | ORAL | Status: AC
  Administered 2017-10-27: 4 mg via ORAL
  Filled 2017-10-27: qty 1

## 2017-10-27 MED ORDER — ONDANSETRON 4 MG PO TBDP: 4.0000 mg | ORAL_TABLET | Freq: Three times a day (TID) | ORAL | 0 refills | Status: DC | PRN

## 2017-10-27 MED ORDER — BENZONATATE 100 MG PO CAPS: 100.0000 mg | ORAL_CAPSULE | Freq: Three times a day (TID) | ORAL | 0 refills | Status: DC | PRN

## 2017-10-27 MED ORDER — FLUTICASONE PROPIONATE 50 MCG/ACT NA SUSP: 2.0000 | Freq: Every day | NASAL | 0 refills | Status: DC

## 2017-10-27 MED ORDER — DEXAMETHASONE SODIUM PHOSPHATE 10 MG/ML IJ SOLN
10.0000 mg | Freq: Once | INTRAMUSCULAR | Status: AC
  Administered 2017-10-27: 10 mg via INTRAMUSCULAR
  Filled 2017-10-27: qty 1

## 2017-10-27 NOTE — ED Provider Notes (Signed)
[name] EMERGENCY DEPARTMENT Provider Note   CSN: [id number] Arrival date & time: 10/27/17  1149     History   Chief Complaint Chief Complaint  Patient presents with  . Influenza    HPI Robyn Cummings is a 22 y.o. female presents for evaluation of acute onset, persistent cough, nasal congestion, sore throat for 9 days.  She notes posttussive emesis and states she has not been able to keep any food or drink down since her symptoms began due to the persistent cough.  She also notes some sore throat.  She notes subjective fevers and chills once but has not checked her temperature and does not feel feverish at this time.  She has been trying BC powder and throat lozenges without significant relief.  Notes shortness of breath when the cough persists as well as some chest wall soreness with cough.  The history is provided by the patient.    Past Medical History:  Diagnosis Date  . ADHD (attention deficit hyperactivity disorder)   . Anaphylactic reaction   . Bipolar 1 disorder (HCC)   . Chronic back pain   . Migraine   . Sleep disorder     Patient Active Problem List   Diagnosis Date Noted  . 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 Cannabis abuse 05/09/2015    History reviewed. No pertinent surgical history.   OB History    Gravida  0   Para  0   Term  0   Preterm  0   AB  0   Living        SAB  0   TAB  0   Ectopic  0   Multiple      Live Births               Home Medications    Prior to Admission medications   Medication Sig Start Date End Date Taking? Authorizing Provider  benzonatate (TESSALON) 100 MG capsule Take 1 capsule (100 mg total) by mouth 3 (three) times daily as needed for cough. 10/27/17   Xylina Rhoads A, PA-C  fluticasone (FLONASE) 50 MCG/ACT nasal spray Place 2 sprays into both nostrils daily. 10/27/17   Krishan Mcbreen A, PA-C  Guaifenesin 1200 MG TB12 Take 1 tablet (1,200 mg total) by mouth 2 (two) times daily.  04/18/16   Lawyer, Cristal Deer, PA-C  guaiFENesin-codeine 100-10 MG/5ML syrup Take 5 mLs by mouth at bedtime as needed for cough. 03/19/17   Dartha Lodge, PA-C  ondansetron (ZOFRAN ODT) 4 MG disintegrating tablet Take 1 tablet (4 mg total) by mouth every 8 (eight) hours as needed for nausea or vomiting. 10/27/17   Eoin Willden A, PA-C  promethazine (PHENERGAN) 25 MG tablet Take 1 tablet (25 mg total) by mouth every 6 (six) hours as needed for nausea. 03/21/17   Fayrene Helper, PA-C    Family History History reviewed. No pertinent family history.  Social History Social History   Tobacco Use  . Smoking status: Current Every Day Smoker    Packs/day: 0.50    Types: Cigarettes  . Smokeless tobacco: Never Used  Substance Use Topics  . Alcohol use: No  . Drug use: Yes    Types: Marijuana    Comment: Pt sts "every chance I get"     Allergies   Mushroom extract complex; Fish allergy; Lime [lime, sulfurated]; and Spinach   Review of Systems Review of Systems  Constitutional: Negative for chills and fever.  HENT: Positive for congestion, sinus  pressure and sore throat.   Respiratory: Positive for cough and shortness of breath.   Cardiovascular: Positive for chest pain (chest wall pain with cough).  Gastrointestinal: Positive for nausea and vomiting. Negative for abdominal pain.  All other systems reviewed and are negative.    Physical Exam Updated Vital Signs BP 133/88 (BP Location: Right Arm)   Pulse 77   Temp 98.2 F (36.8 C) (Oral)   Resp 16   SpO2 98%   Physical Exam  Constitutional: She appears well-developed and well-nourished. No distress.  HENT:  Head: Normocephalic and atraumatic.  TMs without erythema or bulging bilaterally.  Nasal septum midline with mucosal edema bilaterally.  No maxillary sinus tenderness but there is frontal sinus tenderness.  Posterior oropharynx with mild tonsillar hypertrophy but no exudates, erythema, trismus, uvular deviation, or sublingual  abnormalities.  Tolerating secretions without difficulty.  Postnasal drip noted.  Eyes: Conjunctivae are normal. Right eye exhibits no discharge. Left eye exhibits no discharge.  Neck: Normal range of motion. Neck supple. No JVD present. No tracheal deviation present.  Bilateral anterior cervical lymphadenopathy  Cardiovascular: Normal rate.  Pulmonary/Chest: Effort normal and breath sounds normal. She exhibits tenderness.  Equal rise and fall of chest, no increased work of breathing.  Persistent dry cough noted on examination  Abdominal: Soft. Bowel sounds are normal. She exhibits no distension. There is no tenderness. There is no guarding.  Musculoskeletal: She exhibits no edema.  Lymphadenopathy:    She has cervical adenopathy.  Neurological: She is alert.  Skin: Skin is warm and dry. No erythema.  Psychiatric: She has a normal mood and affect. Her behavior is normal.  Nursing note and vitals reviewed.    ED Treatments / Results  Labs (all labs ordered are listed, but only abnormal results are displayed) Labs Reviewed  CBC WITH DIFFERENTIAL/PLATELET - Abnormal; Notable for the following components:      Result Value   Hemoglobin 11.9 (*)    All other components within normal limits  COMPREHENSIVE METABOLIC PANEL  I-STAT BETA HCG BLOOD, ED ([facility], [facility], [facility] ONLY)    EKG None  Radiology Dg Chest 2 View  Result Date: 10/27/2017 CLINICAL DATA:  Pt to ER for evaluation of cough, congestion, nausea, vomiting and abd pain x1 week. No heart or lung conditions per pt. EXAM: CHEST - 2 VIEW COMPARISON:  03/21/2017 FINDINGS: The heart size and mediastinal contours are within normal limits. Both lungs are clear. No pleural effusion or pneumothorax. The visualized skeletal structures are unremarkable. IMPRESSION: Normal chest radiographs. Electronically Signed   By: Amie Portland M.D.   On: 10/27/2017 14:30    Procedures Procedures (including critical care time)  Medications Ordered in  ED Medications  dexamethasone (DECADRON) injection 10 mg (10 mg Intramuscular Given 10/27/17 1345)  chlorpheniramine-HYDROcodone (TUSSIONEX) 10-8 MG/5ML suspension 5 mL (5 mLs Oral Given 10/27/17 1348)  ondansetron (ZOFRAN-ODT) disintegrating tablet 4 mg (4 mg Oral Given 10/27/17 1344)     Initial Impression / Assessment and Plan / ED Course  I have reviewed the triage vital signs and the nursing notes.  Pertinent labs & imaging results that were available during my care of the patient were reviewed by me and considered in my medical decision making (see chart for details).    Patient with URI symptoms with associated nausea and posttussive emesis for 1 week.  She is afebrile, vital signs are stable.  She is nontoxic in appearance.  Pt CXR negative for acute infiltrate.  Tenderness to palpation of  the chest wall, does not appear to be cardiac in etiology, more so musculoskeletal.  Patients symptoms are consistent with URI, likely viral etiology. Discussed that antibiotics are not indicated for viral infections.  She was given IM Decadron, Tussionex, and Zofran in the ED with significant improvement in her symptoms.  Tolerating p.o. food and fluids on reevaluation.  Abdomen is soft and nontender, doubt acute surgical abdominal pathology including appendicitis, obstruction, perforation, cholecystitis.  Pt will be discharged with symptomatic treatment.  Recommend follow-up with PCP if symptoms persist.  Discussed strict ED return precautions.  Pt verbalized understanding of and agreement with plan and is safe for discharge home at this time.  Final Clinical Impressions(s) / ED Diagnoses   Final diagnoses:  Viral URI with cough  Post-tussive emesis    ED Discharge Orders         Ordered    benzonatate (TESSALON) 100 MG capsule  3 times daily PRN     10/27/17 1448    fluticasone (FLONASE) 50 MCG/ACT nasal spray  Daily     10/27/17 1448    ondansetron (ZOFRAN ODT) 4 MG disintegrating tablet   Every 8 hours PRN     10/27/17 1448           Bedie Dominey, Marlynn Perking, PA-C 10/27/17 1507    Melene Plan, DO 10/27/17 1609

## 2017-10-27 NOTE — Discharge Instructions (Addendum)
Drink plenty of water and get plenty of rest. Gargle warm salt water and spit it out for sore throat. May also use cough drops, warm teas, etc. Take flonase to decrease nasal congestion.  Take over-the-counter allergy medicines like Allegra or Zyrtec for nasal congestion and scratchy throat.  Take Zofran as needed for nausea and vomiting.  Place this medicine under your tongue and let it dissolve.  Wait around 20 or 30 minutes before having anything to eat or drink to give this medicine time to work.  You can alternate 600 mg of ibuprofen and [id number] mg of Tylenol every 3 hours as needed for pain. Do not exceed 4000 mg of Tylenol daily.   Followup with your primary care doctor in 5-7 days for recheck of ongoing symptoms. Return to emergency department for emergent changing or worsening of symptoms such as throat tightness, facial swelling, fever not controlled by ibuprofen or Tylenol,difficulty breathing, or chest pain.

## 2017-10-27 NOTE — ED Triage Notes (Addendum)
Pt to ER for evaluation of cough, congestion, nausea, and vomiting  x1 week. VSS.

## 2017-10-27 NOTE — ED Notes (Signed)
Patient transported to X-ray 

## 2018-07-20 ENCOUNTER — Emergency Department (HOSPITAL_COMMUNITY): Payer: Medicaid Other

## 2018-07-20 ENCOUNTER — Emergency Department (HOSPITAL_COMMUNITY)
Admission: EM | Admit: 2018-07-20 | Discharge: 2018-07-20 | Discharge status: Against Medical Advice | Payer: Medicaid Other | Attending: Emergency Medicine | Admitting: Emergency Medicine

## 2018-07-20 ENCOUNTER — Encounter (HOSPITAL_COMMUNITY): Payer: Self-pay | Admitting: Emergency Medicine

## 2018-07-20 ENCOUNTER — Other Ambulatory Visit: Payer: Self-pay

## 2018-07-20 DIAGNOSIS — R0789 Other chest pain: Secondary | ICD-10-CM

## 2018-07-20 DIAGNOSIS — R232 Flushing: Secondary | ICD-10-CM | POA: Insufficient documentation

## 2018-07-20 DIAGNOSIS — R61 Generalized hyperhidrosis: Secondary | ICD-10-CM | POA: Insufficient documentation

## 2018-07-20 DIAGNOSIS — R509 Fever, unspecified: Secondary | ICD-10-CM | POA: Insufficient documentation

## 2018-07-20 DIAGNOSIS — J45909 Unspecified asthma, uncomplicated: Secondary | ICD-10-CM | POA: Insufficient documentation

## 2018-07-20 DIAGNOSIS — J3489 Other specified disorders of nose and nasal sinuses: Secondary | ICD-10-CM | POA: Insufficient documentation

## 2018-07-20 DIAGNOSIS — R0981 Nasal congestion: Secondary | ICD-10-CM | POA: Insufficient documentation

## 2018-07-20 DIAGNOSIS — F17210 Nicotine dependence, cigarettes, uncomplicated: Secondary | ICD-10-CM | POA: Insufficient documentation

## 2018-07-20 DIAGNOSIS — M542 Cervicalgia: Secondary | ICD-10-CM | POA: Insufficient documentation

## 2018-07-20 DIAGNOSIS — Z5329 Procedure and treatment not carried out because of patient's decision for other reasons: Secondary | ICD-10-CM | POA: Insufficient documentation

## 2018-07-20 DIAGNOSIS — Z79899 Other long term (current) drug therapy: Secondary | ICD-10-CM | POA: Insufficient documentation

## 2018-07-20 DIAGNOSIS — M546 Pain in thoracic spine: Secondary | ICD-10-CM | POA: Insufficient documentation

## 2018-07-20 DIAGNOSIS — R51 Headache: Secondary | ICD-10-CM | POA: Insufficient documentation

## 2018-07-20 DIAGNOSIS — R05 Cough: Secondary | ICD-10-CM | POA: Insufficient documentation

## 2018-07-20 LAB — CBC
HCT: 35 % — ABNORMAL LOW (ref 36.0–46.0)
Hemoglobin: 10.7 g/dL — ABNORMAL LOW (ref 12.0–15.0)
MCH: 26.6 pg (ref 26.0–34.0)
MCHC: 30.6 g/dL (ref 30.0–36.0)
MCV: 87.1 fL (ref 80.0–100.0)
Platelets: 245 10*3/uL (ref 150–400)
RBC: 4.02 MIL/uL (ref 3.87–5.11)
RDW: 15.8 % — ABNORMAL HIGH (ref 11.5–15.5)
WBC: 7.5 10*3/uL (ref 4.0–10.5)
nRBC: 0 % (ref 0.0–0.2)

## 2018-07-20 LAB — BASIC METABOLIC PANEL
Anion gap: 8 (ref 5–15)
BUN: 13 mg/dL (ref 6–20)
CO2: 22 mmol/L (ref 22–32)
Calcium: 8.7 mg/dL — ABNORMAL LOW (ref 8.9–10.3)
Chloride: 107 mmol/L (ref 98–111)
Creatinine, Ser: 0.67 mg/dL (ref 0.44–1.00)
GFR calc Af Amer: >60 mL/min (ref >60)
GFR calc non Af Amer: >60 mL/min (ref >60)
Glucose, Bld: 89 mg/dL (ref 70–99)
Potassium: 4 mmol/L (ref 3.5–5.1)
Sodium: 137 mmol/L (ref 135–145)

## 2018-07-20 LAB — TROPONIN I (HIGH SENSITIVITY): Troponin I (High Sensitivity): <2 ng/L (ref <18)

## 2018-07-20 LAB — I-STAT BETA HCG BLOOD, ED (MC, WL, AP ONLY): I-stat hCG, quantitative: <5 m[IU]/mL (ref <5)

## 2018-07-20 MED ORDER — SODIUM CHLORIDE 0.9% FLUSH: 3.0000 mL | Freq: Once | INTRAVENOUS | Status: DC

## 2018-07-20 NOTE — ED Notes (Signed)
Pt here for eval of fever chills, sweats. States she woke up with chest pain underneath left breast and left shoulder blade. Denies SOB.

## 2018-07-20 NOTE — ED Notes (Signed)
Pt left AMA. Explained to pt we were waiting for all labs to result. Pt stated her ride was here and she could not wait. PA notified.

## 2018-07-20 NOTE — ED Provider Notes (Cosign Needed)
[name] Provider Note   CSN: [id number] Arrival date & time: 07/20/18  1106    History   Chief Complaint No chief complaint on file.   HPI Robyn Cummings is a 23 y.o. female with history of ADHD, bipolar 1 disorder, chronic low back pain, migraines presents to the ER for evaluation of chest pain.  Onset 2 days ago.  It has been constant, described as pressure.  It is localized to the left upper chest.  Also has pain in her neck, upper thoracic back. CP is worse when she is smoking cigarettes and marijuana which causes her to cough.  Her chest hurts more when she is actively coughing.  States she smokes marijuana and cigarettes to help her with her bipolar 1 disorder and her sleeping issues.  States she has had this chest pain in the past before but it eventually went away on its own without any intervention.  She went to the nail salon and they told her that her temperature was 100.3.  Since she has felt some hot flashes and this morning she woke up sweating.  Typically states that she is always cold because she is anemic so this is different for her.  Has had associated headache in setting of h/o migraines.  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  She otherwise has not had any exposure to suspected or confirmed COVID-19.  [place] to [place] over the course of 3 days with frequent breaks.  She took ibuprofen which helped her chest pain.  Mild intermittent rhinorrhea and congestion that she attributes to her seasonal allergies.  She denies any sore throat, cough, shortness of breath, nausea, vomiting, abdominal pain, diarrhea.  She lives with her boyfriend who is well and has no symptoms.  No history of DVT/PE.  No use of hormone therapy, malignancy, hemoptysis, lower extremity edema or calf pain, surgery.    HPI  Past Medical History:  Diagnosis Date  . ADHD  (attention deficit hyperactivity disorder)   . Anaphylactic reaction   . Bipolar 1 disorder (HCC)   . Chronic back pain   . Migraine   . Sleep disorder     Patient Active Problem List   Diagnosis Date Noted  . 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 Cannabis abuse 05/09/2015    History reviewed. No pertinent surgical history.   OB History    Gravida  0   Para  0   Term  0   Preterm  0   AB  0   Living        SAB  0   TAB  0   Ectopic  0   Multiple      Live Births               Home Medications    Prior to Admission medications   Medication Sig Start Date End Date Taking? Authorizing Provider  benzonatate (TESSALON) 100 MG capsule Take 1 capsule (100 mg total) by mouth 3 (three) times daily as needed for cough. 10/27/17   Fawze, Mina A, PA-C  fluticasone (FLONASE) 50 MCG/ACT nasal spray Place 2 sprays into both nostrils daily. 10/27/17   Fawze, Mina A, PA-C  Guaifenesin 1200 MG TB12 Take 1 tablet (1,200 mg total) by mouth 2 (two) times daily. 04/18/16   Lawyer, Cristal Deerhristopher, PA-C  guaiFENesin-codeine 100-10 MG/5ML syrup Take 5 mLs by mouth  at bedtime as needed for cough. 03/19/17   Dartha LodgeFord, Kelsey N, PA-C  ondansetron (ZOFRAN ODT) 4 MG disintegrating tablet Take 1 tablet (4 mg total) by mouth every 8 (eight) hours as needed for nausea or vomiting. 10/27/17   Fawze, Mina A, PA-C  promethazine (PHENERGAN) 25 MG tablet Take 1 tablet (25 mg total) by mouth every 6 (six) hours as needed for nausea. 03/21/17   Fayrene Helperran, Bow[place], PA-C    Family History No family history on file.  Social History Social History   Tobacco Use  . Smoking status: Current Every Day Smoker    Packs/day: 0.50    Types: Cigarettes  . Smokeless tobacco: Never Used  Substance Use Topics  . Alcohol use: No  . Drug use: Yes    Types: Marijuana    Comment: Pt sts "every chance I get"     Allergies   Mushroom extract complex; Fish allergy; Lime [lime, sulfurated]; and Spinach   Review of  Systems Review of Systems  Constitutional: Positive for chills and diaphoresis.  Cardiovascular: Positive for chest pain.  Neurological: Positive for headaches.  All other systems reviewed and are negative.    Physical Exam Updated Vital Signs BP 118/78   Pulse 82   Temp 98.5 F (36.9 C) (Oral)   Resp 18   LMP 07/20/2018   SpO2 96%   Physical Exam Constitutional:      Appearance: She is well-developed.     Comments: NAD. Non toxic.   HENT:     Head: Normocephalic and atraumatic.     Nose: Nose normal.  Eyes:     General: Lids are normal.     Conjunctiva/sclera: Conjunctivae normal.  Neck:     Musculoskeletal: Normal range of motion.     Trachea: Trachea normal.      Comments: Bilateral paraspinal cervical muscular and trapezius tenderness.  No meningismus. Cardiovascular:     Rate and Rhythm: Normal rate and regular rhythm.     Pulses:          Carotid pulses are 2+ on the left side.      Radial pulses are 1+ on the right side and 1+ on the left side.       Dorsalis pedis pulses are 1+ on the right side and 1+ on the left side.     Heart sounds: Normal heart sounds, S1 normal and S2 normal.     Comments: No LE edema or calf tenderness.  Pulmonary:     Effort: Pulmonary effort is normal.     Breath sounds: Normal breath sounds.  Chest:       Comments: Reproducible left upper chest wall tenderness. Musculoskeletal:     Thoracic back: She exhibits tenderness.       Back:     Comments: Diffuse upper thoracic muscular tenderness, no midline tenderness.  No step-offs.  Skin:    General: Skin is warm and dry.     Capillary Refill: Capillary refill takes less than 2 seconds.     Comments: No rash to chest wall  Neurological:     Mental Status: She is alert.     GCS: GCS eye subscore is 4. GCS verbal subscore is 5. GCS motor subscore is 6.     Comments: Sensation and strength is intact in upper and lower extremities.  Psychiatric:        Speech: Speech normal.         Behavior: Behavior normal.  Thought Content: Thought content normal.      ED Treatments / Results  Labs (all labs ordered are listed, but only abnormal results are displayed) Labs Reviewed  BASIC METABOLIC PANEL - Abnormal; Notable for the following components:      Result Value   Calcium 8.7 (*)    All other components within normal limits  CBC - Abnormal; Notable for the following components:   Hemoglobin 10.7 (*)    HCT 35.0 (*)    RDW 15.8 (*)    All other components within normal limits  I-STAT BETA HCG BLOOD, ED ([facility], [facility], [facility] ONLY)  TROPONIN I (HIGH SENSITIVITY)  TROPONIN I (HIGH SENSITIVITY)    EKG EKG Interpretation  Date/Time:  Sunday July 20 2018 11:27:58 EDT Ventricular Rate:  64 PR Interval:    QRS Duration: 91 QT Interval:  360 QTC Calculation: 372 R Axis:   43 Text Interpretation:  Sinus arrhythmia since last tracing no significant change Confirmed by Mancel BaleWentz, Elliott [phone]) on 07/20/2018 11:37:06 AM Also confirmed by Armanda Magicurner, Traci ([id number]), editor Barbette Hairassel, Kerry [phone]  on 07/20/2018 1:37:32 PM   Radiology Dg Chest 2 View  Result Date: 07/20/2018 CLINICAL DATA:  Fever, chills and diaphoresis. Chest pain beneath the left breast and left scapula. EXAM: CHEST - 2 VIEW COMPARISON:  10/27/2017. FINDINGS: Normal sized heart. Clear lungs with normal vascularity. Very minimal scoliosis. IMPRESSION: No acute abnormality. Electronically Signed   By: Beckie SaltsSteven  Reid M.D.   On: 07/20/2018 12:08    Procedures Procedures (including critical care time)  Medications Ordered in ED Medications  sodium chloride flush (NS) 0.9 % injection 3 mL (0 mLs Intravenous Hold 07/20/18 1204)     Initial Impression / Assessment and Plan / ED Course  I have reviewed the triage vital signs and the nursing notes.  Pertinent labs & imaging results that were available during my care of the patient were reviewed by me and considered in my medical decision making (see chart for  details).  Clinical Course as of Jul 20 1354  Sun Jul 20, 2018  1204 Sinus arrhythmia since last tracing no significant change Confirmed by Mancel BaleWentz, Elliott [phone] on 07/20/2018 11:37:06 AM  EKG 12-Lead [CG]    Clinical Course User Index [CG] Liberty HandyGibbons, Nalayah Hitt J, PA-C   23 year old is here with what sounds like atypical chest pain.  Chest pain has been constant for 2 days.  Pressure, nonexertional, nonpleuritic, non-positional.  It is easily reproducible on palpation today on exam.  She has also reproducible upper thoracic and neck tenderness.  Suspect that this may be related to her underlying asthma especially because the chest pressure is worse when she smokes tobacco/marijuana and starts actively coughing.  She could be having bronchospasm.  No concerning features such as fever, cough, shortness of breath.  No palpitations, lightheadedness, syncope, pleuritic pain, lower extremity edema or calf pain to suggest DVT.  Well score is low and PERC negative.  [place] over [place] period with frequent breaks.  Overall I doubt PE.  She has no cardiac risk factors other than tobacco use.  Highly unlikely that this is ACS. HEART score is 1.  Given constant CP I don't think we need a delta troponin today.  Work up benign. EKG non-ischemic.  Hs-trop <2 .   Given symptomatology, exam, non ischemic cardiac work up in ER and HEART score patient is appropriate for discharge with PCP.  Work up not suggestive of symptomatic anemia, PE, PTX, dissection, ACS.  Likely MSK etiology vs pleurisy vs costochondritis vs asthma/smoking related. She is concerned about COVID but given overall clinical status, emergent testing not indicated and I recommended community test as needed for work.   1455: I was notified pt left AMA prior to troponin.  Reviewed troponin <2 now. AVS not given to patient prior to discharge however I had discussed with her CP/cough may be related to smoking and asthma. Recommended  cessation of this and f/u with PCP which pt had agreed to. ED return preacutions given during our conversation and prior to her leaving AMA.  Final Clinical Impressions(s) / ED Diagnoses   Final diagnoses:  Atypical chest pain    ED Discharge Orders    None       Kinnie Feil, PA-C 07/20/18 1356

## 2018-08-20 ENCOUNTER — Emergency Department (HOSPITAL_COMMUNITY)
Admission: EM | Admit: 2018-08-20 | Discharge: 2018-08-20 | Disposition: A | Discharge status: Home / Self Care | Payer: Medicaid Other | Attending: Emergency Medicine | Admitting: Emergency Medicine

## 2018-08-20 ENCOUNTER — Other Ambulatory Visit: Payer: Self-pay

## 2018-08-20 DIAGNOSIS — K649 Unspecified hemorrhoids: Secondary | ICD-10-CM | POA: Insufficient documentation

## 2018-08-20 DIAGNOSIS — Z79899 Other long term (current) drug therapy: Secondary | ICD-10-CM | POA: Insufficient documentation

## 2018-08-20 DIAGNOSIS — F17210 Nicotine dependence, cigarettes, uncomplicated: Secondary | ICD-10-CM | POA: Insufficient documentation

## 2018-08-20 MED ORDER — POLYETHYLENE GLYCOL 3350 17 G PO PACK: 17.0000 g | PACK | Freq: Every day | ORAL | 0 refills | Status: DC

## 2018-08-20 MED ORDER — HYDROCODONE-ACETAMINOPHEN 5-325 MG PO TABS
1.0000 | ORAL_TABLET | Freq: Once | ORAL | Status: AC
  Administered 2018-08-20: 12:00:00 1 via ORAL
  Filled 2018-08-20: qty 1

## 2018-08-20 MED ORDER — HYDROCORTISONE ACETATE 25 MG RE SUPP: 25.0000 mg | Freq: Two times a day (BID) | RECTAL | 0 refills | Status: DC

## 2018-08-20 MED ORDER — LIDOCAINE HCL URETHRAL/MUCOSAL 2 % EX GEL
1.0000 "application " | Freq: Once | CUTANEOUS | Status: AC
  Administered 2018-08-20: 1 via TOPICAL
  Filled 2018-08-20: qty 20

## 2018-08-20 NOTE — ED Provider Notes (Signed)
[name]   CSN: [id number] Arrival date & time: 08/20/18  1013    History   Chief Complaint Chief Complaint  Patient presents with  . Rectal Pain    HPI Robyn Cummings is a 23 y.o. female.     HPI   Pt is a 23 y/o female with a h/o ADHD, bipolar disorder, who presents to the ED today for eval of a rectal pain. States that a few days ago she felt like she had a boil to her bottom just lateral to the rectum on the left side. She denies any fevers. She has tried epsom salt, hydrogen peroxide. She states she tried to pop it herself but has not had any drainage. She rates pain as 10/10. Has tried tylenol without relief. States she does have a h/o chronic constipation and had a hard stool a few days ago.   Past Medical History:  Diagnosis Date  . ADHD (attention deficit hyperactivity disorder)   . Anaphylactic reaction   . Bipolar 1 disorder (HCC)   . Chronic back pain   . Migraine   . Sleep disorder     Patient Active Problem List   Diagnosis Date Noted  . 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 Cannabis abuse 05/09/2015    No past surgical history on file.   OB History    Gravida  0   Para  0   Term  0   Preterm  0   AB  0   Living        SAB  0   TAB  0   Ectopic  0   Multiple      Live Births               Home Medications    Prior to Admission medications   Medication Sig Start Date End Date Taking? Authorizing Provider  benzonatate (TESSALON) 100 MG capsule Take 1 capsule (100 mg total) by mouth 3 (three) times daily as needed for cough. 10/27/17   Fawze, Mina A, PA-C  fluticasone (FLONASE) 50 MCG/ACT nasal spray Place 2 sprays into both nostrils daily. 10/27/17   Fawze, Mina A, PA-C  Guaifenesin 1200 MG TB12 Take 1 tablet (1,200 mg total) by mouth 2 (two) times daily. 04/18/16   Lawyer, Cristal Deerhristopher, PA-C  guaiFENesin-codeine 100-10 MG/5ML syrup Take 5 mLs by mouth at bedtime as needed  for cough. 03/19/17   Dartha LodgeFord, Kelsey N, PA-C  hydrocortisone (ANUSOL-HC) 25 MG suppository Place 1 suppository (25 mg total) rectally 2 (two) times daily. 08/20/18   Aniyha Tate S, PA-C  ondansetron (ZOFRAN ODT) 4 MG disintegrating tablet Take 1 tablet (4 mg total) by mouth every 8 (eight) hours as needed for nausea or vomiting. 10/27/17   Fawze, Mina A, PA-C  polyethylene glycol (MIRALAX) 17 g packet Take 17 g by mouth daily. Dissolve one cap full in solution (water, gatorade, etc.) and administer once cap-full daily. You may titrate up daily by 1 cap-full until the patient is having pudding consistency of stools. After the patient is able to start passing softer stools they will need to be on 1/2 cap-full daily for 2 weeks. 08/20/18   Bejamin Hackbart S, PA-C  promethazine (PHENERGAN) 25 MG tablet Take 1 tablet (25 mg total) by mouth every 6 (six) hours as needed for nausea. 03/21/17   Fayrene Helperran, [place], PA-C    Family History No family history on file.  Social History Social History   Tobacco  Use  . Smoking status: Current Every Day Smoker    Packs/day: 0.50    Types: Cigarettes  . Smokeless tobacco: Never Used  Substance Use Topics  . Alcohol use: No  . Drug use: Yes    Types: Marijuana    Comment: Pt sts "every chance I get"     Allergies   Mushroom extract complex; Lime [lime, sulfurated]; and Spinach   Review of Systems Review of Systems  Constitutional: Negative for fever.  Gastrointestinal: Positive for rectal pain. Negative for abdominal pain, nausea and vomiting.  Skin:       boil     Physical Exam Updated Vital Signs BP 118/86   Pulse 67   Temp 98.3 F (36.8 C) (Oral)   Resp 14   LMP 08/10/2018 (Exact Date)   SpO2 100%   Physical Exam Vitals signs and nursing note reviewed.  Constitutional:      General: She is not in acute distress.    Appearance: She is well-developed.  HENT:     Head: Normocephalic and atraumatic.  Eyes:     Conjunctiva/sclera:  Conjunctivae normal.  Neck:     Musculoskeletal: Neck supple.  Cardiovascular:     Rate and Rhythm: Normal rate and regular rhythm.  Pulmonary:     Effort: Pulmonary effort is normal.     Breath sounds: Normal breath sounds.  Abdominal:     Palpations: Abdomen is soft.     Tenderness: There is no abdominal tenderness.  Genitourinary:    Comments: Chaperone present. DRE performed. There is a firm external hemorrhoid present on the left part of the rectum. There is no erythema or signs of infection/abscess. No internal hemorrhoids present. Skin:    General: Skin is warm and dry.  Neurological:     Mental Status: She is alert.      ED Treatments / Results  Labs (all labs ordered are listed, but only abnormal results are displayed) Labs Reviewed - No data to display  EKG None  Radiology No results found.  Procedures Procedures (including critical care time)  Medications Ordered in ED Medications  HYDROcodone-acetaminophen (NORCO/VICODIN) 5-325 MG per tablet 1 tablet (has no administration in time range)  lidocaine (XYLOCAINE) 2 % jelly 1 application (has no administration in time range)     Initial Impression / Assessment and Plan / ED Course  I have reviewed the triage vital signs and the nursing notes.  Pertinent labs & imaging results that were available during my care of the patient were reviewed by me and considered in my medical decision making (see chart for details).     Final Clinical Impressions(s) / ED Diagnoses   Final diagnoses:  Hemorrhoids, unspecified hemorrhoid type   23 year old female with history of chronic constipation presenting for evaluation of rectal pain.  On exam she appears to have an external hemorrhoid that is tender to palpation.  There is no signs of infection or abscess.  Provided pain medication and symptomatic relief in the ED.  She will be given treatment for home as well as a work note.  I have advised follow-up with PCP and  return to the ER for new or worsening symptoms.  She voices understanding of the plan and reasons to return.  All questions answered.  Patient stable for discharge.  ED Discharge Orders         Ordered    polyethylene glycol (MIRALAX) 17 g packet  Daily     08/20/18 1119    hydrocortisone (  ANUSOL-HC) 25 MG suppository  2 times daily     08/20/18 [address] Julius Matus S, PA-C 08/20/18 1121    Charlesetta Shanks, MD 09/10/18 1003

## 2018-08-20 NOTE — ED Triage Notes (Signed)
Pt here for two inch diameter "boil on my butthole" x 4 days. Pt sts she has been trying to squeeze it to drain without success. Pt sts it is painful to wipe or have a bowel movement. Denies fever, n/v.

## 2018-08-20 NOTE — Discharge Instructions (Addendum)
You may alternate taking Tylenol and Ibuprofen as needed for pain control. You may take 400-600 mg of ibuprofen every 6 hours and [phone] mg of Tylenol every 6 hours. Do not exceed 4000 mg of Tylenol daily as this can lead to liver damage. Also, make sure to take Ibuprofen with meals as it can cause an upset stomach. Do not take other NSAIDs while taking Ibuprofen such as (Aleve, Naprosyn, Aspirin, Celebrex, etc) and do not take more than the prescribed dose as this can lead to ulcers and bleeding in your GI tract.   Please continue to do warm sits baths at home.  You were given prescriptions to help with the pain and you are also given MiraLAX to help with constipation which can help prevent hemorrhoids in the future.  Please use as directed.  Please follow up with your primary doctor within the next 7-10 days for re-evaluation and further treatment of your symptoms.   Please return to the ER sooner if you have any new or worsening symptoms.

## 2018-11-06 ENCOUNTER — Encounter (HOSPITAL_COMMUNITY): Payer: Self-pay | Admitting: Emergency Medicine

## 2018-11-06 ENCOUNTER — Emergency Department (HOSPITAL_COMMUNITY)
Admission: EM | Admit: 2018-11-06 | Discharge: 2018-11-06 | Disposition: A | Discharge status: Home / Self Care | Payer: Self-pay | Attending: Emergency Medicine | Admitting: Emergency Medicine

## 2018-11-06 ENCOUNTER — Emergency Department (HOSPITAL_COMMUNITY): Payer: Self-pay

## 2018-11-06 ENCOUNTER — Other Ambulatory Visit: Payer: Self-pay

## 2018-11-06 DIAGNOSIS — Z20828 Contact with and (suspected) exposure to other viral communicable diseases: Secondary | ICD-10-CM | POA: Insufficient documentation

## 2018-11-06 DIAGNOSIS — R059 Cough, unspecified: Secondary | ICD-10-CM

## 2018-11-06 DIAGNOSIS — F1290 Cannabis use, unspecified, uncomplicated: Secondary | ICD-10-CM | POA: Insufficient documentation

## 2018-11-06 DIAGNOSIS — F17210 Nicotine dependence, cigarettes, uncomplicated: Secondary | ICD-10-CM | POA: Insufficient documentation

## 2018-11-06 DIAGNOSIS — R05 Cough: Secondary | ICD-10-CM | POA: Insufficient documentation

## 2018-11-06 DIAGNOSIS — F319 Bipolar disorder, unspecified: Secondary | ICD-10-CM | POA: Insufficient documentation

## 2018-11-06 DIAGNOSIS — F909 Attention-deficit hyperactivity disorder, unspecified type: Secondary | ICD-10-CM | POA: Insufficient documentation

## 2018-11-06 DIAGNOSIS — Z79899 Other long term (current) drug therapy: Secondary | ICD-10-CM | POA: Insufficient documentation

## 2018-11-06 LAB — BASIC METABOLIC PANEL
Anion gap: 9 (ref 5–15)
BUN: 9 mg/dL (ref 6–20)
CO2: 21 mmol/L — ABNORMAL LOW (ref 22–32)
Calcium: 9 mg/dL (ref 8.9–10.3)
Chloride: 109 mmol/L (ref 98–111)
Creatinine, Ser: 0.72 mg/dL (ref 0.44–1.00)
GFR calc Af Amer: >60 mL/min (ref >60)
GFR calc non Af Amer: >60 mL/min (ref >60)
Glucose, Bld: 107 mg/dL — ABNORMAL HIGH (ref 70–99)
Potassium: 3.9 mmol/L (ref 3.5–5.1)
Sodium: 139 mmol/L (ref 135–145)

## 2018-11-06 LAB — CBC
HCT: 37.8 % (ref 36.0–46.0)
Hemoglobin: 11.9 g/dL — ABNORMAL LOW (ref 12.0–15.0)
MCH: 27.9 pg (ref 26.0–34.0)
MCHC: 31.5 g/dL (ref 30.0–36.0)
MCV: 88.5 fL (ref 80.0–100.0)
Platelets: 241 10*3/uL (ref 150–400)
RBC: 4.27 MIL/uL (ref 3.87–5.11)
RDW: 14.6 % (ref 11.5–15.5)
WBC: 6 10*3/uL (ref 4.0–10.5)
nRBC: 0 % (ref 0.0–0.2)

## 2018-11-06 LAB — TROPONIN I (HIGH SENSITIVITY)
Troponin I (High Sensitivity): <2 ng/L (ref <18)
Troponin I (High Sensitivity): <2 ng/L (ref <18)

## 2018-11-06 LAB — I-STAT BETA HCG BLOOD, ED (MC, WL, AP ONLY): I-stat hCG, quantitative: <5 m[IU]/mL (ref <5)

## 2018-11-06 MED ORDER — ALBUTEROL SULFATE HFA 108 (90 BASE) MCG/ACT IN AERS: 1.0000 | INHALATION_SPRAY | Freq: Four times a day (QID) | RESPIRATORY_TRACT | 0 refills | Status: DC | PRN

## 2018-11-06 MED ORDER — SODIUM CHLORIDE 0.9% FLUSH: 3.0000 mL | Freq: Once | INTRAVENOUS | Status: DC

## 2018-11-06 MED ORDER — BENZONATATE 100 MG PO CAPS: 100.0000 mg | ORAL_CAPSULE | Freq: Three times a day (TID) | ORAL | 0 refills | Status: DC | PRN

## 2018-11-06 MED ORDER — IBUPROFEN 800 MG PO TABS: 800.0000 mg | ORAL_TABLET | Freq: Three times a day (TID) | ORAL | 0 refills | Status: DC

## 2018-11-06 MED ORDER — FLUTICASONE PROPIONATE 50 MCG/ACT NA SUSP: 1.0000 | Freq: Every day | NASAL | 0 refills | Status: DC

## 2018-11-06 NOTE — ED Triage Notes (Signed)
Pt states she has been having CP since nov 1. Pt also complains of cough and nausea.

## 2018-11-06 NOTE — ED Notes (Signed)
PT verbalized understanding of discharge paperwork, prescriptions and follow-up care.

## 2018-11-06 NOTE — Discharge Instructions (Addendum)
You were seen in the emergency today for cough and chest pain.  Your work-up was overall reassuring without significant abnormalities or changes from prior labs/imaging/EKGs you have had done. We suspect your symptoms are viral or allergic in nature.  We have tested you for coronavirus, we will call you if results are positive, if positive you will need to follow below quarantine instructions.  Please be sure to use good hand hygiene.  Take the following medicines to help with your symptoms:  -Flonase to be used 1 spray in each nostril daily.  This medication is used to treat your congestion.  -Tessalon can be taken once every 8 hours as needed.  This medication is used to treat your cough.  -Ibuprofen to be taken once every 8 hours as needed for pain. Please take this medicine with food as it can cause stomach upset and at worst stomach bleeding. Do not take other NSAIDs such as motrin, aleve, advil, naproxen, mobic, etc as they are similar. You make take tylenol per over the counter dosing with this medicine safely.  - Inhaler- use 1-2 puffs every 4-6 hours as needed for shortness of breath/wheezing.   We have prescribed you new medication(s) today. Discuss the medications prescribed today with your pharmacist as they can have adverse effects and interactions with your other medicines including over the counter and prescribed medications. Seek medical evaluation if you start to experience new or abnormal symptoms after taking one of these medicines, seek care immediately if you start to experience difficulty breathing, feeling of your throat closing, facial swelling, or rash as these could be indications of a more serious allergic reaction  You will need to follow-up with your primary care provider in 1 week if your symptoms have not improved.  If you do not have a primary care provider one is provided in your discharge instructions.  Return to the emergency department for any new or worsening  symptoms including but not limited to persistent fever for 5 days, difficulty breathing, chest pain, rashes, passing out, or any other concerns.     Persons with COVID-19 who have symptoms and were directed to care for themselves at home may discontinue home isolation under the  following conditions: - It has been at least 7 days have passed since symptoms first appeared. - AND at least 3 days (72 hours) have passed since recovery defined as resolution of fever without the use of fever-reducing medications and improvement in respiratory symptoms (e.g., cough, shortness of breath)  Please follow the below quarantine instructions.   Please follow up with primary care within 3-5 days for re-evaluation- call prior to going to the office to make them aware of your symptoms. Return to the ER for new or worsening symptoms including but not limited to increased work of breathing, fever, chest pain, passing out, or any other concerns.       Person Under Monitoring Name: Robyn Cummings  Location: [address] [place] [address]   Infection Prevention Recommendations for Individuals Confirmed to have, or Being Evaluated for, 2019 Novel Coronavirus (COVID-19) Infection Who Receive Care at Home  Individuals who are confirmed to have, or are being evaluated for, COVID-19 should follow the prevention steps below until a healthcare provider or local or state health department says they can return to normal activities.  Stay home except to get medical care You should restrict activities outside your home, except for getting medical care. Do not go to work, school, or public  areas, and do not use public transportation or taxis.  Call ahead before visiting your doctor Before your medical appointment, call the healthcare provider and tell them that you have, or are being evaluated for, COVID-19 infection. This will help the healthcare providers office take steps to keep other people  from getting infected. Ask your healthcare provider to call the local or state health department.  Monitor your symptoms Seek prompt medical attention if your illness is worsening (e.g., difficulty breathing). Before going to your medical appointment, call the healthcare provider and tell them that you have, or are being evaluated for, COVID-19 infection. Ask your healthcare provider to call the local or state health department.  Wear a facemask You should wear a facemask that covers your nose and mouth when you are in the same room with other people and when you visit a healthcare provider. People who live with or visit you should also wear a facemask while they are in the same room with you.  Separate yourself from other people in your home As much as possible, you should stay in a different room from other people in your home. Also, you should use a separate bathroom, if available.  Avoid sharing household items You should not share dishes, drinking glasses, cups, eating utensils, towels, bedding, or other items with other people in your home. After using these items, you should wash them thoroughly with soap and water.  Cover your coughs and sneezes Cover your mouth and nose with a tissue when you cough or sneeze, or you can cough or sneeze into your sleeve. Throw used tissues in a lined trash can, and immediately wash your hands with soap and water for at least 20 seconds or use an alcohol-based hand rub.  Wash your Union Pacific Corporationhands Wash your hands often and thoroughly with soap and water for at least 20 seconds. You can use an alcohol-based hand sanitizer if soap and water are not available and if your hands are not visibly dirty. Avoid touching your eyes, nose, and mouth with unwashed hands.   Prevention Steps for Caregivers and Household Members of Individuals Confirmed to have, or Being Evaluated for, COVID-19 Infection Being Cared for in the Home  If you live with, or provide care  at home for, a person confirmed to have, or being evaluated for, COVID-19 infection please follow these guidelines to prevent infection:  Follow healthcare providers instructions Make sure that you understand and can help the patient follow any healthcare provider instructions for all care.  Provide for the patients basic needs You should help the patient with basic needs in the home and provide support for getting groceries, prescriptions, and other personal needs.  Monitor the patients symptoms If they are getting sicker, call his or her medical provider and tell them that the patient has, or is being evaluated for, COVID-19 infection. This will help the healthcare providers office take steps to keep other people from getting infected. Ask the healthcare provider to call the local or state health department.  Limit the number of people who have contact with the patient If possible, have only one caregiver for the patient. Other household members should stay in another home or place of residence. If this is not possible, they should stay in another room, or be separated from the patient as much as possible. Use a separate bathroom, if available. Restrict visitors who do not have an essential need to be in the home.  Keep older adults, very young children, and other  sick people away from the patient Keep older adults, very young children, and those who have compromised immune systems or chronic health conditions away from the patient. This includes people with chronic heart, lung, or kidney conditions, diabetes, and cancer.  Ensure good ventilation Make sure that shared spaces in the home have good air flow, such as from an air conditioner or an opened window, weather permitting.  Wash your hands often Wash your hands often and thoroughly with soap and water for at least 20 seconds. You can use an alcohol based hand sanitizer if soap and water are not available and if your hands are  not visibly dirty. Avoid touching your eyes, nose, and mouth with unwashed hands. Use disposable paper towels to dry your hands. If not available, use dedicated cloth towels and replace them when they become wet.  Wear a facemask and gloves Wear a disposable facemask at all times in the room and gloves when you touch or have contact with the patients blood, body fluids, and/or secretions or excretions, such as sweat, saliva, sputum, nasal mucus, vomit, urine, or feces.  Ensure the mask fits over your nose and mouth tightly, and do not touch it during use. Throw out disposable facemasks and gloves after using them. Do not reuse. Wash your hands immediately after removing your facemask and gloves. If your personal clothing becomes contaminated, carefully remove clothing and launder. Wash your hands after handling contaminated clothing. Place all used disposable facemasks, gloves, and other waste in a lined container before disposing them with other household waste. Remove gloves and wash your hands immediately after handling these items.  Do not share dishes, glasses, or other household items with the patient Avoid sharing household items. You should not share dishes, drinking glasses, cups, eating utensils, towels, bedding, or other items with a patient who is confirmed to have, or being evaluated for, COVID-19 infection. After the person uses these items, you should wash them thoroughly with soap and water.  Wash laundry thoroughly Immediately remove and wash clothes or bedding that have blood, body fluids, and/or secretions or excretions, such as sweat, saliva, sputum, nasal mucus, vomit, urine, or feces, on them. Wear gloves when handling laundry from the patient. Read and follow directions on labels of laundry or clothing items and detergent. In general, wash and dry with the warmest temperatures recommended on the label.  Clean all areas the individual has used often Clean all touchable  surfaces, such as counters, tabletops, doorknobs, bathroom fixtures, toilets, phones, keyboards, tablets, and bedside tables, every day. Also, clean any surfaces that may have blood, body fluids, and/or secretions or excretions on them. Wear gloves when cleaning surfaces the patient has come in contact with. Use a diluted bleach solution (e.g., dilute bleach with 1 part bleach and 10 parts water) or a household disinfectant with a label that says EPA-registered for coronaviruses. To make a bleach solution at home, add 1 tablespoon of bleach to 1 quart (4 cups) of water. For a larger supply, add  cup of bleach to 1 gallon (16 cups) of water. Read labels of cleaning products and follow recommendations provided on product labels. Labels contain instructions for safe and effective use of the cleaning product including precautions you should take when applying the product, such as wearing gloves or eye protection and making sure you have good ventilation during use of the product. Remove gloves and wash hands immediately after cleaning.  Monitor yourself for signs and symptoms of illness Caregivers and household members  are considered close contacts, should monitor their health, and will be asked to limit movement outside of the home to the extent possible. Follow the monitoring steps for close contacts listed on the symptom monitoring form.   ? If you have additional questions, contact your local health department or call the epidemiologist on call at [phone] (available 24/7). ? This guidance is subject to change. For the most up-to-date guidance from [facility], please refer to their website: [email or web address]hu

## 2018-11-06 NOTE — ED Provider Notes (Signed)
[name] Provider Note   CSN: [id number] Arrival date & time: 11/06/18  1225     History   Chief Complaint Chief Complaint  Patient presents with  . Cough    HPI Robyn Cummings is a 23 y.o. female with a hx of tobacco abuse, migraines, bipolar 1 disorder, & cannabis use who presents to the ED with complaints of cough for the past 4 days.  Patient reports that she has nasal congestion/rhinorrhea, cough that is productive of yellow/white phlegm sputum, and chest pain with coughing on the left side, otherwise no chest pain.  Symptoms somewhat alleviated by TheraFlu.  Her boyfriend wanted her to be coronavirus tested prompting ER visit.  Denies fever, chills, ear pain, sore throat, dyspnea, wheezing, abdominal pain, vomiting, or diarrhea. Denies leg pain/swelling, hemoptysis, recent surgery/trauma, recent long travel, hormone use, personal hx of cancer, or hx of DVT/PE. Denies known covid exposure.   HPI  Past Medical History:  Diagnosis Date  . ADHD (attention deficit hyperactivity disorder)   . Anaphylactic reaction   . Bipolar 1 disorder (HCC)   . Chronic back pain   . Migraine   . Sleep disorder     Patient Active Problem List   Diagnosis Date Noted  . 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 Cannabis abuse 05/09/2015    History reviewed. No pertinent surgical history.   OB History    Gravida  0   Para  0   Term  0   Preterm  0   AB  0   Living        SAB  0   TAB  0   Ectopic  0   Multiple      Live Births               Home Medications    Prior to Admission medications   Medication Sig Start Date End Date Taking? Authorizing Provider  benzonatate (TESSALON) 100 MG capsule Take 1 capsule (100 mg total) by mouth 3 (three) times daily as needed for cough. 10/27/17   Fawze, Mina A, PA-C  fluticasone (FLONASE) 50 MCG/ACT nasal spray Place 2 sprays into both nostrils daily. 10/27/17   Fawze, Mina A, PA-C   Guaifenesin 1200 MG TB12 Take 1 tablet (1,200 mg total) by mouth 2 (two) times daily. 04/18/16   Lawyer, Cristal Deerhristopher, PA-C  guaiFENesin-codeine 100-10 MG/5ML syrup Take 5 mLs by mouth at bedtime as needed for cough. 03/19/17   Dartha LodgeFord, Kelsey N, PA-C  hydrocortisone (ANUSOL-HC) 25 MG suppository Place 1 suppository (25 mg total) rectally 2 (two) times daily. 08/20/18   Couture, Cortni S, PA-C  ondansetron (ZOFRAN ODT) 4 MG disintegrating tablet Take 1 tablet (4 mg total) by mouth every 8 (eight) hours as needed for nausea or vomiting. 10/27/17   Fawze, Mina A, PA-C  polyethylene glycol (MIRALAX) 17 g packet Take 17 g by mouth daily. Dissolve one cap full in solution (water, gatorade, etc.) and administer once cap-full daily. You may titrate up daily by 1 cap-full until the patient is having pudding consistency of stools. After the patient is able to start passing softer stools they will need to be on 1/2 cap-full daily for 2 weeks. 08/20/18   Couture, Cortni S, PA-C  promethazine (PHENERGAN) 25 MG tablet Take 1 tablet (25 mg total) by mouth every 6 (six) hours as needed for nausea. 03/21/17   Fayrene Helperran, [place], PA-C    Family History No family history on file.  Social  History Social History   Tobacco Use  . Smoking status: Current Every Day Smoker    Packs/day: 0.50    Types: Cigarettes  . Smokeless tobacco: Never Used  Substance Use Topics  . Alcohol use: No  . Drug use: Yes    Types: Marijuana    Comment: Pt sts "every chance I get"     Allergies   Mushroom extract complex; Lime [lime, sulfurated]; and Spinach   Review of Systems Review of Systems  Constitutional: Negative for chills and fever.  HENT: Positive for congestion and rhinorrhea. Negative for ear pain and sore throat.   Respiratory: Positive for cough. Negative for shortness of breath.   Cardiovascular: Positive for chest pain (w/ coughing otherwise none). Negative for leg swelling.  Gastrointestinal: Negative for abdominal pain,  diarrhea, nausea and vomiting.  Musculoskeletal: Negative for myalgias.  Neurological: Negative for syncope.  All other systems reviewed and are negative.    Physical Exam Updated Vital Signs BP (!) 151/99 (BP Location: Left Arm)   Pulse 90   Temp 98 F (36.7 C) (Oral)   Resp 15   Ht 5\' 4"  (1.626 m)   Wt 72.6 kg   LMP 11/05/2018   SpO2 99%   BMI 27.46 kg/m   Physical Exam Vitals signs and nursing note reviewed.  Constitutional:      General: She is not in acute distress.    Appearance: She is well-developed.  HENT:     Head: Normocephalic and atraumatic.     Right Ear: Ear canal normal. Tympanic membrane is not perforated, erythematous, retracted or bulging.     Left Ear: Ear canal normal. Tympanic membrane is not perforated, erythematous, retracted or bulging.     Ears:     Comments: No mastoid erythema/swelling/tenderness.     Nose: Congestion present.     Right Sinus: No maxillary sinus tenderness or frontal sinus tenderness.     Left Sinus: No maxillary sinus tenderness or frontal sinus tenderness.     Mouth/Throat:     Pharynx: Uvula midline. No oropharyngeal exudate or posterior oropharyngeal erythema.     Comments: Posterior oropharynx is symmetric appearing. Patient tolerating own secretions without difficulty. No trismus. No drooling. No hot potato voice. No swelling beneath the tongue, submandibular compartment is soft.  Eyes:     General:        Right eye: No discharge.        Left eye: No discharge.     Conjunctiva/sclera: Conjunctivae normal.     Pupils: Pupils are equal, round, and reactive to light.  Neck:     Musculoskeletal: Normal range of motion and neck supple. No edema or neck rigidity.  Cardiovascular:     Rate and Rhythm: Normal rate and regular rhythm.     Heart sounds: No murmur.  Pulmonary:     Effort: Pulmonary effort is normal. No respiratory distress.     Breath sounds: Normal breath sounds. No wheezing, rhonchi or rales.  Chest:      Chest wall: Tenderness (left anterior chest wall w/o crepitus) present.  Abdominal:     General: There is no distension.     Palpations: Abdomen is soft.     Tenderness: There is no abdominal tenderness.  Musculoskeletal:     Right lower leg: No edema.     Left lower leg: No edema.  Lymphadenopathy:     Cervical: No cervical adenopathy.  Skin:    General: Skin is warm and dry.  Findings: No rash.  Neurological:     Mental Status: She is alert.  Psychiatric:        Behavior: Behavior normal.      ED Treatments / Results  Labs (all labs ordered are listed, but only abnormal results are displayed) Labs Reviewed  BASIC METABOLIC PANEL - Abnormal; Notable for the following components:      Result Value   CO2 21 (*)    Glucose, Bld 107 (*)    All other components within normal limits  CBC - Abnormal; Notable for the following components:   Hemoglobin 11.9 (*)    All other components within normal limits  I-STAT BETA HCG BLOOD, ED ([facility], [facility], [facility] ONLY)  TROPONIN I (HIGH SENSITIVITY)    EKG  Date: 11/06/2018  Rate: 89  Rhythm: normal sinus rhythm  QRS Axis: normal  Intervals: normal  ST/T Wave abnormalities: normal  Conduction Disutrbances: none No STEMI   Radiology Dg Chest 2 View  Result Date: 11/06/2018 CLINICAL DATA:  Chest pain and cough for 4 days, smoker, asthma EXAM: CHEST - 2 VIEW COMPARISON:  07/20/2018 FINDINGS: Normal heart size, mediastinal contours, and pulmonary vascularity. Lungs clear. No pleural effusion or pneumothorax. Bones unremarkable. IMPRESSION: Normal exam.  The Electronically Signed   By: Ulyses Southward M.D.   On: 11/06/2018 13:04    Procedures Procedures (including critical care time)  Medications Ordered in ED Medications  sodium chloride flush (NS) 0.9 % injection 3 mL (has no administration in time range)     Initial Impression / Assessment and Plan / ED Course  I have reviewed the triage vital signs and the nursing notes.   Pertinent labs & imaging results that were available during my care of the patient were reviewed by me and considered in my medical decision making (see chart for details).   Patient presents to the emergency department with complaints of congestion, cough, and chest pain with coughing.  She is nontoxic-appearing, resting comfortably, vitals WNL with exception of elevated blood pressure, doubt HTN emergency.  Afebrile, no sinus tenderness, sxs < 10 days, doubt acute bacterial sinusitis.  Oropharynx and TMs are clear not consistent with strep pharyngitis or AOM.  No meningismus.  Work-up per triage reviewed: CBC: Anemia improved from prior.  No leukocytosis. BMP: No significant derangements. Pregnancy test: Negative Troponin: < 2 x2  EKG: NSR, No STEMI CXR: Normal  PERC negative, doubt PE.  Pain reproducible with palpation with reassuring EKG and troponins doubt ACS.  No infiltrate on chest x-ray, afebrile, doubt acute bacterial pneumonia.  Symptoms are likely allergic versus viral with a degree of MSK versus pleurisy chest pain component.  Will test for coronavirus discussed quarantine and hand hygiene.  Will treat symptomatically. I discussed results, treatment plan, need for follow-up, and return precautions with the patient. Provided opportunity for questions, patient confirmed understanding and is in agreement with plan.    Robyn Cummings was evaluated in Emergency Department on 11/06/2018 for the symptoms described in the history of present illness. He/she was evaluated in the context of the global COVID-19 pandemic, which necessitated consideration that the patient might be at risk for infection with the SARS-CoV-2 virus that causes COVID-19. Institutional protocols and algorithms that pertain to the evaluation of patients at risk for COVID-19 are in a state of rapid change based on information released by regulatory bodies including the CDC and federal and state organizations. These policies  and algorithms were followed during the patient's care in the ED.  Final Clinical Impressions(s) / ED Diagnoses   Final diagnoses:  Cough    ED Discharge Orders         Ordered    fluticasone (FLONASE) 50 MCG/ACT nasal spray  Daily     11/06/18 1556    benzonatate (TESSALON) 100 MG capsule  3 times daily PRN     11/06/18 1556    ibuprofen (ADVIL) 800 MG tablet  3 times daily     11/06/18 1556    albuterol (VENTOLIN HFA) 108 (90 Base) MCG/ACT inhaler  Every 6 hours PRN     11/06/18 1556           Petrucelli, [place] R, PA-C 11/06/18 1558    Gerhard Munch, MD 11/07/18 [phone]

## 2018-11-07 LAB — NOVEL CORONAVIRUS, NAA (HOSP ORDER, SEND-OUT TO REF LAB; TAT 18-24 HRS): SARS-CoV-2, NAA: NOT DETECTED

## 2019-01-02 NOTE — L&D Delivery Note (Signed)
OB/GYN Faculty Practice Delivery Note  Robyn Cummings is a 24 y.o. G4P0030 s/p vaginal delivery at [redacted]w[redacted]d. She was admitted for IOL secondary to cHTN.   ROM: 19h 35m with moderate meconium stained fluid GBS Status: negative Maximum Maternal Temperature: 100.22F  Labor Progress: On admission, pt received cytotec x2; FB was also placed. Pt was then transitioned to pitocin at 1751 on 11/22, and AROM for moderate meconium stained fluid at 2212. At 0930 on 11/23, pt was noted to have fever to 100.22F with up-trending fetal heart rate baseline. She received ampicillin and gentamycin for empiric treatment of Triple I. Pt continued to progress well and was found to have complete cervical dilation at 1430 on 11/23 with uncomplicated delivery as noted below.  Delivery Date/Time: 1751 on 11/24/19 Delivery: Called to room and patient was complete and pushing. Head delivered ROA. No nuchal cord present. Shoulder and body delivered in usual fashion. Infant with spontaneous cry, placed on mother's abdomen, dried and stimulated. Cord clamped x 2 after 1-minute delay, and cut by mother under my direct supervision. Cord blood drawn. Placenta delivered spontaneously with gentle cord traction. Fundus firm with massage and Pitocin. Labia, perineum, vagina, and cervix were inspected, notable for 1st degree perineal laceration s/p repair with single figure-of-eight stitch using 4-0 vicryl.   Placenta: 3-vessel cord, intact, sent to L&D Complications: Triple I s/p antibiotics prior to delivery as noted above Lacerations: 1st degree perineal laceration s/p repair with single figure-of-eight stitch using 4-0 vicryl EBL: 25 ml Analgesia: epidural  Infant: female  APGARs 8 & 9  3147g  Lynnda Shields, MD OB/GYN Fellow, Faculty Practice

## 2019-05-08 ENCOUNTER — Ambulatory Visit (INDEPENDENT_AMBULATORY_CARE_PROVIDER_SITE_OTHER): Payer: Self-pay

## 2019-05-08 DIAGNOSIS — O0990 Supervision of high risk pregnancy, unspecified, unspecified trimester: Secondary | ICD-10-CM | POA: Insufficient documentation

## 2019-05-08 MED ORDER — BLOOD PRESSURE KIT DEVI: 1.0000 | 0 refills | Status: DC

## 2019-05-08 NOTE — Progress Notes (Signed)
I connected with  Emerson Baize on 05/08/19 by a video enabled telemedicine application and verified that I am speaking with the correct person using two identifiers.   I discussed the limitations of evaluation and management by telemedicine. The patient expressed understanding and agreed to proceed.  PRENATAL INTAKE SUMMARY  Ms. Sacco presents today New OB Nurse Interview.   OB History    Gravida  3   Para  0   Term  0   Preterm  0   AB  2   Living  0     SAB  0   TAB  0   Ectopic  0   Multiple      Live Births             I have reviewed the patient's medical, obstetrical, social, and family histories, medications, and available lab results.  SUBJECTIVE She has no unusual complaints  OBJECTIVE Initial Nurse Interview (New OB)  GENERAL APPEARANCE: oriented to person, place and time   ASSESSMENT High Risk pregnancy, patient had 2 miscarriages. PHQ-9=6  PLAN Prenatal care FEMINA OB labs will be done at NOB visit BP Cuff ordered, patient will pick up and take to NOB Appt. Pregnancy Risk Screening done

## 2019-05-11 NOTE — Progress Notes (Signed)
I have reviewed this chart and agree with the RN/CMA assessment and management.    K. Meryl Davis, M.D. Attending [facility] (Faculty Practice)   

## 2019-05-15 ENCOUNTER — Encounter: Payer: Self-pay | Admitting: Obstetrics and Gynecology

## 2019-05-15 ENCOUNTER — Ambulatory Visit (INDEPENDENT_AMBULATORY_CARE_PROVIDER_SITE_OTHER): Payer: Medicaid Other | Admitting: Obstetrics and Gynecology

## 2019-05-15 ENCOUNTER — Other Ambulatory Visit: Payer: Self-pay

## 2019-05-15 ENCOUNTER — Other Ambulatory Visit (HOSPITAL_COMMUNITY)
Admission: RE | Admit: 2019-05-15 | Discharge: 2019-05-15 | Disposition: A | Discharge status: Home / Self Care | Payer: Medicaid Other | Source: Ambulatory Visit | Attending: Obstetrics and Gynecology | Admitting: Obstetrics and Gynecology

## 2019-05-15 VITALS — BP 131/84 | HR 101 | Temp 97.6°F | Wt 197.0 lb

## 2019-05-15 DIAGNOSIS — O0990 Supervision of high risk pregnancy, unspecified, unspecified trimester: Secondary | ICD-10-CM

## 2019-05-15 DIAGNOSIS — Z3480 Encounter for supervision of other normal pregnancy, unspecified trimester: Secondary | ICD-10-CM | POA: Insufficient documentation

## 2019-05-15 DIAGNOSIS — Z3481 Encounter for supervision of other normal pregnancy, first trimester: Secondary | ICD-10-CM

## 2019-05-15 DIAGNOSIS — Z3A11 11 weeks gestation of pregnancy: Secondary | ICD-10-CM

## 2019-05-15 NOTE — Progress Notes (Signed)
INITIAL PRENATAL VISIT NOTE  Subjective:  Robyn Cummings is a 24 y.o. G3P0020 at 58w4dby LMP being seen today for her initial prenatal visit. This is a planned pregnancy. She was using nothing for birth control previously. She has an obstetric history significant for 2x SAB. She has a medical history significant for asthma.  Patient reports tugging.  Contractions: Not present. Vag. Bleeding: None.   . Denies leaking of fluid.    Past Medical History:  Diagnosis Date  . ADHD (attention deficit hyperactivity disorder)   . Anaphylactic reaction   . Anemia   . Anxiety   . [place]ipolar 1 disorder ([place]   . Chronic back pain   . Dyspnea   . Migraine   . Seizures ([place]   . Sleep disorder   . Vaginal Pap smear, abnormal     No past surgical history on file.  OB History  Gravida Para Term Preterm AB Living  3 0 0 0 2 0  SAB TAB Ectopic Multiple Live Births  0 0 0        # Outcome Date GA Lbr Len/2nd Weight Sex Delivery Anes PTL Lv  3 Current           2 AB           1 AB             Social History   Socioeconomic History  . Marital status: Single    Spouse name: Not on file  . Number of children: Not on file  . Years of education: Not on file  . Highest education level: Not on file  Occupational History  . Occupation: [employer]  Tobacco Use  . Smoking status: Current Some Day Smoker    Packs/day: 0.50    Types: Cigarettes  . Smokeless tobacco: Never Used  Substance and Sexual Activity  . Alcohol use: No  . Drug use: Not Currently    Types: Marijuana  . Sexual activity: Yes    Birth control/protection: None  Other Topics Concern  . Not on file  Social History Narrative   ** Merged History Encounter **       Social Determinants of Health   Financial Resource Strain:   . Difficulty of Paying Living Expenses:   Food Insecurity:   . Worried About RCharity fundraiserin the Last Year:   . RArboriculturistin the Last Year:   Transportation Needs:   . LLexicographer(Medical):   .Marland KitchenLack of Transportation (Non-Medical):   Physical Activity:   . Days of Exercise per Week:   . Minutes of Exercise per Session:   Stress:   . Feeling of Stress :   Social Connections:   . Frequency of Communication with Friends and Family:   . Frequency of Social Gatherings with Friends and Family:   . Attends Religious Services:   . Active Member of Clubs or Organizations:   . Attends CArchivistMeetings:   .Marland KitchenMarital Status:     Family History  Problem Relation Age of Onset  . Anxiety disorder Mother   . Arthritis Father   . Asthma Father   . Cancer Maternal Aunt   . Diabetes Maternal Uncle   . Cancer Paternal Uncle   . Diabetes Paternal Grandmother   . Stroke Paternal Grandmother      Current Outpatient Medications:  .  albuterol (VENTOLIN HFA) 108 (90 Base) MCG/ACT inhaler, Inhale 1-2 puffs  into the lungs every 6 (six) hours as needed for wheezing or shortness of breath. (Patient not taking: Reported on 05/08/2019), Disp: 6.7 g, Rfl: 0 .  benzonatate (TESSALON) 100 MG capsule, Take 1 capsule (100 mg total) by mouth 3 (three) times daily as needed for cough. (Patient not taking: Reported on 05/08/2019), Disp: 21 capsule, Rfl: 0 .  Blood Pressure Monitoring (BLOOD PRESSURE KIT) DEVI, 1 kit by Does not apply route once a week. Check Blood Pressure regularly and record readings into the Babyscripts App.  Large Cuff.  DX O90.0 (Patient not taking: Reported on 05/08/2019), Disp: 1 each, Rfl: 0 .  fluticasone (FLONASE) 50 MCG/ACT nasal spray, Place 1 spray into both nostrils daily. (Patient not taking: Reported on 05/08/2019), Disp: 16 g, Rfl: 0 .  Guaifenesin 1200 MG TB12, Take 1 tablet (1,200 mg total) by mouth 2 (two) times daily. (Patient not taking: Reported on 05/08/2019), Disp: 20 each, Rfl: 0 .  guaiFENesin-codeine 100-10 MG/5ML syrup, Take 5 mLs by mouth at bedtime as needed for cough. (Patient not taking: Reported on 05/08/2019), Disp: 120 mL, Rfl:  0 .  hydrocortisone (ANUSOL-HC) 25 MG suppository, Place 1 suppository (25 mg total) rectally 2 (two) times daily. (Patient not taking: Reported on 05/08/2019), Disp: 12 suppository, Rfl: 0 .  ibuprofen (ADVIL) 800 MG tablet, Take 1 tablet (800 mg total) by mouth 3 (three) times daily. (Patient not taking: Reported on 05/08/2019), Disp: 15 tablet, Rfl: 0 .  ondansetron (ZOFRAN ODT) 4 MG disintegrating tablet, Take 1 tablet (4 mg total) by mouth every 8 (eight) hours as needed for nausea or vomiting. (Patient not taking: Reported on 05/08/2019), Disp: 10 tablet, Rfl: 0 .  polyethylene glycol (MIRALAX) 17 g packet, Take 17 g by mouth daily. Dissolve one cap full in solution (water, gatorade, etc.) and administer once cap-full daily. You may titrate up daily by 1 cap-full until the patient is having pudding consistency of stools. After the patient is able to start passing softer stools they will need to be on 1/2 cap-full daily for 2 weeks. (Patient not taking: Reported on 05/08/2019), Disp: 14 each, Rfl: 0 .  promethazine (PHENERGAN) 25 MG tablet, Take 1 tablet (25 mg total) by mouth every 6 (six) hours as needed for nausea. (Patient not taking: Reported on 05/08/2019), Disp: 12 tablet, Rfl: 0  Allergies  Allergen Reactions  . Mushroom Extract Complex Anaphylaxis    Anything with mushrooms  . Lime [Lime, Sulfurated] Other (See Comments)    Skin rash   . Spinach Nausea And Vomiting    "can't have cooked spinach"    Review of Systems: Negative except for what is mentioned in HPI.  Objective:   Vitals:   05/15/19 1029  BP: 131/84  Pulse: (!) 101  Temp: 97.6 F (36.4 C)  Weight: 197 lb (89.4 kg)    Fetal Status: Fetal Heart Rate (bpm): 160         Physical Exam: BP 131/84   Pulse (!) 101   Temp 97.6 F (36.4 C)   Wt 197 lb (89.4 kg)   LMP 02/23/2019   BMI 33.81 kg/m  CONSTITUTIONAL: Well-developed, well-nourished female in no acute distress.  NEUROLOGIC: Alert and oriented to person, place,  and time. Normal reflexes, muscle tone coordination. No cranial nerve deficit noted. PSYCHIATRIC: Normal mood and affect. Normal behavior. Normal judgment and thought content. SKIN: Skin is warm and dry. No rash noted. Not diaphoretic. No erythema. No pallor. HENT:  Normocephalic, atraumatic, External right and  left ear normal. Oropharynx is clear and moist EYES: Conjunctivae and EOM are normal. Pupils are equal, round, and reactive to light. No scleral icterus.  NECK: Normal range of motion, supple, no masses CARDIOVASCULAR: Normal heart rate noted, regular rhythm RESPIRATORY: Effort and breath sounds normal, no problems with respiration noted BREASTS: deferred ABDOMEN: Soft, nontender, nondistended, gravid. GU: normal appearing external female genitalia, nulliparous normal appearing cervix, scant white discharge in vagina, no lesions noted Bimanual: 12 weeks sized uterus, no adnexal tenderness or palpable lesions noted MUSCULOSKELETAL: Normal range of motion. EXT:  No edema and no tenderness. 2+ distal pulses.   Pt informed that the ultrasound is considered a limited OB ultrasound and is not intended to be a complete ultrasound exam.  Patient also informed that the ultrasound is not being completed with the intent of assessing for fetal or placental anomalies or any pelvic abnormalities.  Explained that the purpose of today's ultrasound is to assess for  viability.  Patient acknowledges the purpose of the exam and the limitations of the study.    Bedside US: singleton IUP with cardiac activity noted, unable to obtain CRL due to position but appears to be ~12 w  Assessment and Plan:  Pregnancy: G3P0020 at 34w4dby LMP  1. Supervision of other normal pregnancy, antepartum - Cytology - PAP( [place]) - Hepatitis C Antibody - Obstetric Panel, Including HIV - Culture, OB Urine - Cervicovaginal ancillary only( [place]) - Genetic Screening - Enroll Patient in Babyscripts -  Babyscripts Schedule Optimization - [place] OB COMP + 14 WK; Future - [place] W[place]structure, multiple providers, fellows, medical students, virtual visits, MyChart.  - Patient has not had COVID vaccine. Reviewed risks/benefits in pregnancy/breastfeeding and gave info. She declines.    Preterm labor symptoms and general obstetric precautions including but not limited to vaginal bleeding, contractions, leaking of fluid and fetal movement were reviewed in detail with the patient.  Please refer to After Visit Summary for other counseling recommendations.   Return in about 4 weeks (around 06/12/2019) for low OB, virtual.  KSloan Leiter5/14/2021 11:53 AM

## 2019-05-15 NOTE — Progress Notes (Signed)
New OB, c/o Prenatal Vitamins making her extra drowsy. Patient was shown how to use BP Cuff.

## 2019-05-16 LAB — OBSTETRIC PANEL, INCLUDING HIV
Antibody Screen: NEGATIVE
Basophils Absolute: 0 10*3/uL (ref 0.0–0.2)
Basos: 1 %
EOS (ABSOLUTE): 0.2 10*3/uL (ref 0.0–0.4)
Eos: 2 %
HIV Screen 4th Generation wRfx: NONREACTIVE
Hematocrit: 35.7 % (ref 34.0–46.6)
Hemoglobin: 11.8 g/dL (ref 11.1–15.9)
Hepatitis B Surface Ag: NEGATIVE
Immature Grans (Abs): 0 10*3/uL (ref 0.0–0.1)
Immature Granulocytes: 0 %
Lymphocytes Absolute: 2 10*3/uL (ref 0.7–3.1)
Lymphs: 22 %
MCH: 28.3 pg (ref 26.6–33.0)
MCHC: 33.1 g/dL (ref 31.5–35.7)
MCV: 86 fL (ref 79–97)
Monocytes Absolute: 0.5 10*3/uL (ref 0.1–0.9)
Monocytes: 6 %
Neutrophils Absolute: 6.2 10*3/uL (ref 1.4–7.0)
Neutrophils: 69 %
Platelets: 268 10*3/uL (ref 150–450)
RBC: 4.17 x10E6/uL (ref 3.77–5.28)
RDW: 13.6 % (ref 11.7–15.4)
RPR Ser Ql: NONREACTIVE
Rh Factor: POSITIVE
Rubella Antibodies, IGG: 4.35 index (ref >0.99)
WBC: 8.9 10*3/uL (ref 3.4–10.8)

## 2019-05-16 LAB — HEPATITIS C ANTIBODY: Hep C Virus Ab: <0.1 s/co ratio (ref 0.0–0.9)

## 2019-05-17 LAB — CULTURE, OB URINE

## 2019-05-17 LAB — URINE CULTURE, OB REFLEX

## 2019-05-18 LAB — CERVICOVAGINAL ANCILLARY ONLY
Bacterial Vaginitis (gardnerella): NEGATIVE
Candida Glabrata: NEGATIVE
Candida Vaginitis: NEGATIVE
Chlamydia: NEGATIVE
Comment: NEGATIVE
Comment: NEGATIVE
Comment: NEGATIVE
Comment: NEGATIVE
Comment: NEGATIVE
Comment: NORMAL
Neisseria Gonorrhea: NEGATIVE
Trichomonas: POSITIVE — AB

## 2019-05-18 LAB — CYTOLOGY - PAP
Comment: NEGATIVE
Diagnosis: NEGATIVE
High risk HPV: NEGATIVE

## 2019-05-19 ENCOUNTER — Encounter: Payer: Self-pay | Admitting: Obstetrics and Gynecology

## 2019-05-19 DIAGNOSIS — A599 Trichomoniasis, unspecified: Secondary | ICD-10-CM | POA: Insufficient documentation

## 2019-05-19 MED ORDER — METRONIDAZOLE 500 MG PO TABS: 500.0000 mg | ORAL_TABLET | Freq: Two times a day (BID) | ORAL | 0 refills | Status: DC

## 2019-05-19 NOTE — Addendum Note (Signed)
Addended by: Leroy Libman on: 05/19/2019 08:43 AM   Modules accepted: Orders

## 2019-05-20 ENCOUNTER — Telehealth: Payer: Self-pay

## 2019-05-20 NOTE — Telephone Encounter (Signed)
S/w pt and advised of results, rx sent and treatment plan. Pt states she is out of town but will pick up rx when she returns.

## 2019-05-22 ENCOUNTER — Encounter: Payer: Self-pay | Admitting: Obstetrics and Gynecology

## 2019-05-27 ENCOUNTER — Ambulatory Visit: Payer: Self-pay

## 2019-05-28 ENCOUNTER — Telehealth: Payer: Self-pay | Admitting: *Deleted

## 2019-05-28 ENCOUNTER — Ambulatory Visit: Payer: Self-pay

## 2019-05-28 NOTE — Telephone Encounter (Signed)
Placed call to pt.  No answer, LVM making her aware she does not need an in office appt. She needs to pick up Rx at pharmacy and that is all she needs.

## 2019-06-02 ENCOUNTER — Encounter: Payer: Self-pay | Admitting: Obstetrics and Gynecology

## 2019-06-12 ENCOUNTER — Encounter: Payer: Self-pay | Admitting: Certified Nurse Midwife

## 2019-06-12 ENCOUNTER — Telehealth (INDEPENDENT_AMBULATORY_CARE_PROVIDER_SITE_OTHER): Payer: Self-pay | Admitting: Certified Nurse Midwife

## 2019-06-12 VITALS — BP 141/89 | HR 92

## 2019-06-12 DIAGNOSIS — R102 Pelvic and perineal pain: Secondary | ICD-10-CM

## 2019-06-12 DIAGNOSIS — A599 Trichomoniasis, unspecified: Secondary | ICD-10-CM

## 2019-06-12 DIAGNOSIS — O0992 Supervision of high risk pregnancy, unspecified, second trimester: Secondary | ICD-10-CM

## 2019-06-12 DIAGNOSIS — O0990 Supervision of high risk pregnancy, unspecified, unspecified trimester: Secondary | ICD-10-CM

## 2019-06-12 DIAGNOSIS — R030 Elevated blood-pressure reading, without diagnosis of hypertension: Secondary | ICD-10-CM

## 2019-06-12 DIAGNOSIS — Z3A15 15 weeks gestation of pregnancy: Secondary | ICD-10-CM

## 2019-06-12 DIAGNOSIS — O98312 Other infections with a predominantly sexual mode of transmission complicating pregnancy, second trimester: Secondary | ICD-10-CM

## 2019-06-12 DIAGNOSIS — O26899 Other specified pregnancy related conditions, unspecified trimester: Secondary | ICD-10-CM

## 2019-06-12 DIAGNOSIS — O26892 Other specified pregnancy related conditions, second trimester: Secondary | ICD-10-CM

## 2019-06-12 MED ORDER — VITAFOL FE+ 90-1-200 & 50 MG PO CPPK: 1.0000 | ORAL_CAPSULE | Freq: Every day | ORAL | 3 refills | Status: DC

## 2019-06-12 NOTE — Progress Notes (Addendum)
OBSTETRICS PRENATAL VIRTUAL VISIT ENCOUNTER NOTE  Provider location: [facility] for [facility]   I connected with Latricia Heft on 06/12/19 at 10:49 AM EDT by MyChart Video Encounter at home and verified that I am speaking with the correct person using two identifiers.   I discussed the limitations, risks, security and privacy concerns of performing an evaluation and management service virtually and the availability of in person appointments. I also discussed with the patient that there may be a patient responsible charge related to this service. The patient expressed understanding and agreed to proceed. Subjective:  Robyn Cummings is a 24 y.o. G3P0020 at [redacted]w[redacted]d being seen today for ongoing prenatal care.  She is currently monitored for the following issues for this low-risk pregnancy and has Posttraumatic amnesia; Cannabis abuse; Supervision of high risk pregnancy, antepartum; and Trichomonas infection on their problem list.  Patient reports pelvic pain.  Contractions: Not present. Vag. Bleeding: None.  Movement: Present. Denies any leaking of fluid.   The following portions of the patient's history were reviewed and updated as appropriate: allergies, current medications, past family history, past medical history, past social history, past surgical history and problem list.   Objective:   Vitals:   06/12/19 0852  BP: (!) 141/89  Pulse: 92    Fetal Status:     Movement: Present     General:  Alert, oriented and cooperative. Patient is in no acute distress.  Respiratory: Normal respiratory effort, no problems with respiration noted  Mental Status: Normal mood and affect. Normal behavior. Normal judgment and thought content.  Rest of physical exam deferred due to type of encounter  Imaging: No results found.  Assessment and Plan:  Pregnancy: G3P0020 at [redacted]w[redacted]d 1. Supervision of high risk pregnancy, antepartum - Routine prenatal care - Anticipatory guidance on  upcoming appointments with next being in office  - Rx for PNV sent to pharmacy of choice  - Enroll Patient in Babyscripts - Prenat-FePoly-Metf-FA-DHA-DSS (VITAFOL FE+) 90-1-200 & 50 MG CPPK; Take 1 tablet by mouth daily.  Dispense: 60 each; Refill: 3  2. Trichomonas infection - TOC at next appointment   3. Elevated blood pressure reading without diagnosis of hypertension - BP today 141/89 - patient reports that she had got worked up or "excited" after conversation with the nurse, reports that her BP has been going up and down since pregnancy  - Does not have access to babyscripts app  - babyscripts order placed today, encouraged patient to check junk email if she does not receive later today, patient verbalizes understanding  - encouraged to take BP later today and send to office through mychart, patient verbalizes understanding   4. Pain of round ligament during pregnancy - patient reports occasional pelvic pain, like she "is blowing up like a balloon".  - educated and discussed RLP during pregnancy, safe medications to take during pregnancy and warning signs/reasons to present to MAU    Preterm labor symptoms and general obstetric precautions including but not limited to vaginal bleeding, contractions, leaking of fluid and fetal movement were reviewed in detail with the patient. I discussed the assessment and treatment plan with the patient. The patient was provided an opportunity to ask questions and all were answered. The patient agreed with the plan and demonstrated an understanding of the instructions. The patient was advised to call back or seek an in-person office evaluation/go to [facility] at [facility] for any urgent or concerning symptoms. Please refer to After Visit Summary for  other counseling recommendations.   I provided 11 minutes of face-to-face time during this encounter.  Return in about 4 weeks (around 07/10/2019) for ROB/AFP/TOC.  Future Appointments  Date  Time Provider [place]  06/12/2019 11:10 AM Lajean Manes, CNM [facility] None  07/07/2019  1:30 PM [facility] NURSE [facility]  07/07/2019  1:30 PM [facility] US3 [facility] [place] [place] for [employer], [place]

## 2019-06-12 NOTE — Progress Notes (Signed)
I connected with  Robyn Cummings on 06/12/19 by a video enabled telemedicine application and verified that I am speaking with the correct person using two identifiers.   I discussed the limitations of evaluation and management by telemedicine. The patient expressed understanding and agreed to proceed.  MyChart OB, needs Rx for PNV. C/o loss of appetite, fatigue.  She is no longer with the [facility] and I gave her the telephone number for the Community Navigator to be connected with the resources that she needs.

## 2019-06-12 NOTE — Patient Instructions (Signed)
Alpha-Fetoprotein Test Why am I having this test? The alpha-fetoprotein test is most commonly used in pregnant women to help screen for birth defects in their unborn baby. It can be used to screen for birth defects, such as chromosome (DNA) abnormalities, problems with the brain or spinal cord, or problems with the abdominal wall of the unborn baby (fetus). The alpha-fetoprotein test may also be done for men or non-pregnant women to check for certain cancers. What is being tested? This test measures the amount of alpha-fetoprotein (AFP) in your blood. AFP is a protein that is made by the liver. Levels can be detected in the mother's blood during pregnancy, starting at 10 weeks and peaking at 16-18 weeks of the pregnancy. Abnormal levels can sometimes be a sign of a birth defect in the baby. Certain cancers can cause a high level of AFP in men and non-pregnant women. What kind of sample is taken?  A blood sample is required for this test. It is usually collected by inserting a needle into a blood vessel. How are the results reported? Your test results will be reported as values. Your health care provider will compare your results to normal ranges that were established after testing a large group of people (reference values). Reference values may vary among labs and hospitals. For this test, common reference values are:  Adult: Less than 40 ng/mL or less than 40 mcg/L (SI units).  Child younger than 1 year: Less than 30 ng/mL. If you are pregnant, the values may also vary based on how long you have been pregnant. What do the results mean? Results that are above the reference values in pregnant women may indicate the following for the baby:  Neural tube defects, such as abnormalities of the spinal cord or brain.  Abdominal wall defects.  Multiple pregnancy such as twins.  Fetal distress or fetal death. Results that are above the reference values in men or non-pregnant women may  indicate:  Reproductive cancers, such as ovarian or testicular cancer.  Liver cancer.  Liver cell death.  Other types of cancer. Very low levels of AFP in pregnant women may indicate the following for the baby:  Down syndrome.  Fetal death. Talk with your health care provider about what your results mean. Questions to ask your health care provider Ask your health care provider, or the department that is doing the test:  When will my results be ready?  How will I get my results?  What are my treatment options?  What other tests do I need?  What are my next steps? Summary  The alpha-fetoprotein test is done on pregnant women to help screen for birth defects in their unborn baby.  Certain cancers can cause a high level of AFP in men and non-pregnant women.  For this test, a blood sample is usually collected by inserting a needle into a blood vessel.  Talk with your health care provider about what your results mean. This information is not intended to replace advice given to you by your health care provider. Make sure you discuss any questions you have with your health care provider. Document Revised: 11/30/2016 Document Reviewed: 07/24/2016 Elsevier Patient Education  2020 [employer]  

## 2019-07-07 ENCOUNTER — Ambulatory Visit: Payer: Medicaid Other

## 2019-07-10 ENCOUNTER — Other Ambulatory Visit (HOSPITAL_COMMUNITY)
Admission: RE | Admit: 2019-07-10 | Discharge: 2019-07-10 | Disposition: A | Discharge status: Home / Self Care | Payer: Medicaid Other | Source: Ambulatory Visit | Attending: Women's Health | Admitting: Women's Health

## 2019-07-10 ENCOUNTER — Encounter: Payer: Self-pay | Admitting: Women's Health

## 2019-07-10 ENCOUNTER — Other Ambulatory Visit: Payer: Self-pay

## 2019-07-10 ENCOUNTER — Ambulatory Visit (INDEPENDENT_AMBULATORY_CARE_PROVIDER_SITE_OTHER): Payer: Medicaid Other | Admitting: Women's Health

## 2019-07-10 VITALS — BP 123/87 | HR 74 | Wt 203.0 lb

## 2019-07-10 DIAGNOSIS — O99322 Drug use complicating pregnancy, second trimester: Secondary | ICD-10-CM

## 2019-07-10 DIAGNOSIS — F1210 Cannabis abuse, uncomplicated: Secondary | ICD-10-CM

## 2019-07-10 DIAGNOSIS — O0990 Supervision of high risk pregnancy, unspecified, unspecified trimester: Secondary | ICD-10-CM

## 2019-07-10 DIAGNOSIS — Z3A19 19 weeks gestation of pregnancy: Secondary | ICD-10-CM

## 2019-07-10 DIAGNOSIS — B75 Trichinellosis: Secondary | ICD-10-CM | POA: Insufficient documentation

## 2019-07-10 DIAGNOSIS — O98312 Other infections with a predominantly sexual mode of transmission complicating pregnancy, second trimester: Secondary | ICD-10-CM

## 2019-07-10 DIAGNOSIS — A599 Trichomoniasis, unspecified: Secondary | ICD-10-CM

## 2019-07-10 NOTE — Patient Instructions (Addendum)
Maternity Assessment Unit (MAU)  The Maternity Assessment Unit (MAU) is located at the [facility] [place] [facility]. The address is: [address] [place], [place], [place] [address]. Please see map below for additional directions.    The Maternity Assessment Unit is designed to help you during your pregnancy, and for up to 6 weeks after delivery, with any pregnancy- or postpartum-related emergencies, if you think you are in labor, or if your water has broken. For example, if you experience nausea and vomiting, vaginal bleeding, severe abdominal or pelvic pain, elevated blood pressure or other problems related to your pregnancy or postpartum time, please come to the Maternity Assessment Unit for assistance.        Preterm Labor and Birth Information  The normal length of a pregnancy is 39-41 weeks. Preterm labor is when labor starts before 37 completed weeks of pregnancy. What are the risk factors for preterm labor? Preterm labor is more likely to occur in women who:  Have certain infections during pregnancy such as a bladder infection, sexually transmitted infection, or infection inside the uterus (chorioamnionitis).  Have a shorter-than-normal cervix.  Have gone into preterm labor before.  Have had surgery on their cervix.  Are younger than age 64 or older than age 26.  Are African American.  Are pregnant with twins or multiple babies (multiple gestation).  Take street drugs or smoke while pregnant.  Do not gain enough weight while pregnant.  Became pregnant shortly after having been pregnant. What are the symptoms of preterm labor? Symptoms of preterm labor include:  Cramps similar to those that can happen during a menstrual period. The cramps may happen with diarrhea.  Pain in the abdomen or lower back.  Regular uterine contractions that may feel like tightening of the abdomen.  A feeling of increased pressure in the  pelvis.  Increased watery or bloody mucus discharge from the vagina.  Water breaking (ruptured amniotic sac). Why is it important to recognize signs of preterm labor? It is important to recognize signs of preterm labor because babies who are born prematurely may not be fully developed. This can put them at an increased risk for:  Long-term (chronic) heart and lung problems.  Difficulty immediately after birth with regulating body systems, including blood sugar, body temperature, heart rate, and breathing rate.  Bleeding in the brain.  Cerebral palsy.  Learning difficulties.  Death. These risks are highest for babies who are born before 38 weeks of pregnancy. How is preterm labor treated? Treatment depends on the length of your pregnancy, your condition, and the health of your baby. It may involve:  Having a stitch (suture) placed in your cervix to prevent your cervix from opening too early (cerclage).  Taking or being given medicines, such as: ? Hormone medicines. These may be given early in pregnancy to help support the pregnancy. ? Medicine to stop contractions. ? Medicines to help mature the baby's lungs. These may be prescribed if the risk of delivery is high. ? Medicines to prevent your baby from developing cerebral palsy. If the labor happens before 34 weeks of pregnancy, you may need to stay in the hospital. What should I do if I think I am in preterm labor? If you think that you are going into preterm labor, call your health care provider right away. How can I prevent preterm labor in future pregnancies? To increase your chance of having a full-term pregnancy:  Do not use any tobacco products, such as  cigarettes, chewing tobacco, and e-cigarettes. If you need help quitting, ask your health care provider.  Do not use street drugs or medicines that have not been prescribed to you during your pregnancy.  Talk with your health care provider before taking any herbal  supplements, even if you have been taking them regularly.  Make sure you gain a healthy amount of weight during your pregnancy.  Watch for infection. If you think that you might have an infection, get it checked right away.  Make sure to tell your health care provider if you have gone into preterm labor before. This information is not intended to replace advice given to you by your health care provider. Make sure you discuss any questions you have with your health care provider. Document Revised: 04/11/2018 Document Reviewed: 05/11/2015 Elsevier Patient Education  2020 [employer].        Trichomoniasis Trichomoniasis is an STI (sexually transmitted infection) that can affect both women and men. In women, the outer area of the female genitalia (vulva) and the vagina are affected. In men, mainly the penis is affected, but the prostate and other reproductive organs can also be involved.  This condition can be treated with medicine. It often has no symptoms (is asymptomatic), especially in men. If not treated, trichomoniasis can last for months or years. What are the causes? This condition is caused by a parasite called Trichomonas vaginalis. Trichomoniasis most often spreads from person to person (is contagious) through sexual contact. What increases the risk? The following factors may make you more likely to develop this condition:  Having unprotected sex.  Having sex with a partner who has trichomoniasis.  Having multiple sexual partners.  Having had previous trichomoniasis infections or other STIs. What are the signs or symptoms? In women, symptoms of trichomoniasis include:  Abnormal vaginal discharge that is clear, white, gray, or yellow-green and foamy and has an unusual "fishy" odor.  Itching and irritation of the vagina and vulva.  Burning or pain during urination or sex.  Redness and swelling of the genitals. In men, symptoms of trichomoniasis include:  Penile  discharge that may be foamy or contain pus.  Pain in the penis. This may happen only when urinating.  Itching or irritation inside the penis.  Burning after urination or ejaculation. How is this diagnosed? In women, this condition may be found during a routine Pap test or physical exam. It may be found in men during a routine physical exam. Your health care provider may do tests to help diagnose this infection, such as:  Urine tests (men and women).  The following in women: ? Testing the pH of the vagina. ? A vaginal swab test that checks for the Trichomonas vaginalis parasite. ? Testing vaginal secretions. Your health care provider may test you for other STIs, including HIV (human immunodeficiency virus). How is this treated? This condition is treated with medicine taken by mouth (orally), such as metronidazole or tinidazole, to fight the infection. Your sexual partner(s) also need to be tested and treated.  If you are a woman and you plan to become pregnant or think you may be pregnant, tell your health care provider right away. Some medicines that are used to treat the infection should not be taken during pregnancy. Your health care provider may recommend over-the-counter medicines or creams to help relieve itching or irritation. You may be tested for infection again 3 months after treatment. Follow these instructions at home:  Take and use over-the-counter and prescription medicines, including  creams, only as told by your health care provider.  Take your antibiotic medicine as told by your health care provider. Do not stop taking the antibiotic even if you start to feel better.  Do not have sex until 7-10 days after you finish your medicine, or until your health care provider approves. Ask your health care provider when you may start to have sex again.  (Women) Do not douche or wear tampons while you have the infection.  Discuss your infection with your sexual partner(s). Make  sure that your partner gets tested and treated, if necessary.  Keep all follow-up visits as told by your health care provider. This is important. How is this prevented?   Use condoms every time you have sex. Using condoms correctly and consistently can help protect against STIs.  Avoid having multiple sexual partners.  Talk with your sexual partner about any symptoms that either of you may have, as well as any history of STIs.  Get tested for STIs and STDs (sexually transmitted diseases) before you have sex. Ask your partner to do the same.  Do not have sexual contact if you have symptoms of trichomoniasis or another STI. Contact a health care provider if:  You still have symptoms after you finish your medicine.  You develop pain in your abdomen.  You have pain when you urinate.  You have bleeding after sex.  You develop a rash.  You feel nauseous or you vomit.  You plan to become pregnant or think you may be pregnant. Summary  Trichomoniasis is an STI (sexually transmitted infection) that can affect both women and men.  This condition often has no symptoms (is asymptomatic), especially in men.  Without treatment, this condition can last for months or years.  You should not have sex until 7-10 days after you finish your medicine, or until your health care provider approves. Ask your health care provider when you may start to have sex again.  Discuss your infection with your sexual partner(s). Make sure that your partner gets tested and treated, if necessary. This information is not intended to replace advice given to you by your health care provider. Make sure you discuss any questions you have with your health care provider. Document Revised: 10/01/2017 Document Reviewed: 10/01/2017 Elsevier Patient Education  2020 Elsevier Inc.       AREA PEDIATRIC/FAMILY PRACTICE PHYSICIANS  [facility] [address] [place], [place]  [address] Phone - [phone]   Fax - [phone]  JACK AMOS [address] [place], [place]  [address] Phone - [phone]   Fax - [phone]  [facility] [address] [place], [place]  [address] Phone - [phone]   Fax - [phone]  [facility] [address] [place], [place]  [address] Phone - [phone]   Fax - [phone]  [facility] [address] [place], [place]  [address] Phone - [phone]   Fax - [phone]  [facility] [address] [place], [place]  [address] Phone - [phone]   Fax - [phone]  [facility] [place] [address] [place], [place]  [address] Phone - [phone]   Fax - [phone]  [facility] AT [facility] [address] [place], [address] [place], [place]  [address] Phone - [phone]   Fax - [phone]  [facility] AT [facility] [address] [place], [place]  [address] Phone - [phone]  Fax - [phone] [facility] [address] [place], [place]  [address] Phone - [phone]   Fax - [phone]  [facility] [address] [place], [place]  [address] Phone - [phone]   Fax - [phone]  [facility] [address] [place], [place]  [address] Phone - [phone]   Fax - [phone]  [facility] [place] [address] [place], [place]  [address] Phone - [phone]   Fax - [phone]  [facility] [address], [address] [place], [place]  [address] Phone - [phone]  [facility] [address] [place], [place]  [address] Phone - [phone]   Fax - [phone]  [facility] [address] [place], [place]  [address] Phone - [phone]   Fax - [phone]  [facility] [address] [place], [place]  [address] Phone - [phone]   Fax - [phone]  [facility] [address] [place], [place]  [address] Phone - [phone]   Fax - [phone]   [facility] [address] [place], [place]  [address] Phone - [phone]   Fax - [phone]  [facility] [address] [place], [place]  [address] Phone - [phone]   Fax - [phone]  DAVID RUBIN [address] [place], [place]  [address] Phone - [phone]   Fax - [phone]  [facility] [address] [place], [place]  [address] Phone - [phone]   Fax - [phone]  [facility] Al[facility] Chyle [address] [place], [place]  [address] Phone - [phone]   Fax - [phone] Gerarda Fraction [address] [place], [place]  [address] Phone - [phone]   Fax - [phone]  [facility] [address] [place], [place]  [address] Phone - [phone]   Fax - [phone]  [facility] [place] [address] [place], [place]  [address] Phone - [phone]   Fax - [phone]      Considering Linden Dolin? Guide for patients at [facility] [employer] Why consider waterbirth? . Gentle birth for babies  . Less pain medicine used in labor  . May allow for passive descent/less pushing  . May reduce perineal tears  . More mobility and instinctive maternal position changes  . Increased maternal relaxation  . Reduced blood pressure in labor   Is waterbirth safe? What are the risks of infection, drowning or other complications? . Infection:  Marland Kitchen Very low risk (3.7 % for tub vs 4.8% for bed)  . 7 in 8000 waterbirths with documented infection  . Poorly cleaned equipment most common cause  . Slightly lower group B strep transmission rate  . Drowning  . Maternal:  . Very low risk  . Related to seizures or  fainting  . Newborn:  Marland Kitchen Very low risk. No evidence of increased risk of respiratory problems in multiple large studies  . Physiological protection from breathing under water  . Avoid underwater birth if there are any fetal complications  . Once baby's head is out of the water, keep it out.  . Birth complication  . Some reports of cord trauma, but risk decreased by bringing baby to surface gradually  . No evidence of increased risk of shoulder dystocia. Mothers can usually change positions faster in water than in a  bed, possibly aiding the maneuvers to free the shoulder.  ? You must attend a Waterbirth class at [employer] at [facility] . 3rd Wednesday of every month from 7-9pm  . Free  . Register by calling [phone] or online at [email or web address]  . Bring [place] the certificate from the class to your prenatal appointment  Meet with a midwife at 36 weeks to see if you can still plan a waterbirth and to sign the consent.   If you plan a waterbirth at [facility] and [facility] at [facility], you can opt to purchase the following: . Fish Net . Bathing suit top (optional)  . Long-handled mirror (optional)  .  Things that would prevent you from having a waterbirth: . Unknown or Positive COVID-19 diagnosis upon admission to hospital  . Premature, <37wks  . Previous cesarean birth  . Presence of thick meconium-stained fluid  . Multiple gestation (Twins, triplets, etc.)  . Uncontrolled diabetes or gestational diabetes requiring medication  . Hypertension requiring medication or diagnosis of pre-eclampsia  . Heavy vaginal bleeding  . Non-reassuring fetal heart rate  . Active infection (MRSA, etc.). Group B Strep is NOT a contraindication for waterbirth.  . If your labor has to be induced and induction method requires continuous monitoring of the baby's heart rate  . Other risks/issues identified by your obstetrical provider  Please remember that birth is  unpredictable. Under certain unforeseeable circumstances your provider may advise against giving birth in the tub. These decisions will be made on a case-by-case basis and with the safety of you and your baby as our highest priority.  **Please remember that in order to have a waterbirth, you must test Negative to COVID-19 upon admission to the hospital.**

## 2019-07-10 NOTE — Progress Notes (Signed)
ROB   TOC Today and AFP  Pt states she was not able to get PNV's.

## 2019-07-10 NOTE — Progress Notes (Signed)
Subjective:  Robyn Cummings is a 24 y.o. G3P0020 at [redacted]w[redacted]d being seen today for ongoing prenatal care.  She is currently monitored for the following issues for this high-risk pregnancy and has Posttraumatic amnesia; Cannabis abuse; Supervision of high risk pregnancy, antepartum; and Trichomonas infection on their problem list.  Patient reports no complaints.  Contractions: Not present. Vag. Bleeding: None.  Movement: Present. Denies leaking of fluid.   The following portions of the patient's history were reviewed and updated as appropriate: allergies, current medications, past family history, past medical history, past social history, past surgical history and problem list. Problem list updated.  Objective:   Vitals:   07/10/19 0951  BP: 123/87  Pulse: 74  Weight: 203 lb (92.1 kg)    Fetal Status: Fetal Heart Rate (bpm): 146   Movement: Present     General:  Alert, oriented and cooperative. Patient is in no acute distress.  Skin: Skin is warm and dry. No rash noted.   Cardiovascular: Normal heart rate noted  Respiratory: Normal respiratory effort, no problems with respiration noted  Abdomen: Soft, gravid, appropriate for gestational age. Pain/Pressure: Present     Pelvic: Vag. Bleeding: None     Cervical exam deferred        Extremities: Normal range of motion.  Edema: None  Mental Status: Normal mood and affect. Normal behavior. Normal judgment and thought content.   Urinalysis:      Assessment and Plan:  Pregnancy: G3P0020 at [redacted]w[redacted]d  1. Supervision of high risk pregnancy, antepartum - peds list given -pt interested in waterbirth, info given on waterbirth and patient encouraged to register for class, added to waterbirth shared list - anatomy scan scheduled 07/14/2019, pt aware - AFP, Serum, Open Spina Bifida  2. Trichomonas infection - Cervicovaginal ancillary only( [place])   Preterm labor symptoms and general obstetric precautions including but not limited to  vaginal bleeding, contractions, leaking of fluid and fetal movement were reviewed in detail with the patient. Please refer to After Visit Summary for other counseling recommendations.  Return in about 4 weeks (around 08/07/2019) for in-person LOB/MIDWIFE ONLY/discuss WB.   Charmian Forbis, Odie Sera, NP

## 2019-07-13 LAB — CERVICOVAGINAL ANCILLARY ONLY
Bacterial Vaginitis (gardnerella): NEGATIVE
Candida Glabrata: NEGATIVE
Candida Vaginitis: NEGATIVE
Chlamydia: NEGATIVE
Comment: NEGATIVE
Comment: NEGATIVE
Comment: NEGATIVE
Comment: NEGATIVE
Comment: NEGATIVE
Comment: NORMAL
Neisseria Gonorrhea: NEGATIVE
Trichomonas: NEGATIVE

## 2019-07-14 ENCOUNTER — Other Ambulatory Visit: Payer: Self-pay

## 2019-07-14 LAB — AFP, SERUM, OPEN SPINA BIFIDA
AFP MoM: 1.87
AFP Value: 87 ng/mL
Gest. Age on Collection Date: 19 weeks
Maternal Age At EDD: 25.7 yr
OSBR Risk 1 IN: 2166
Test Results:: NEGATIVE
Weight: 203 [lb_av]

## 2019-07-15 ENCOUNTER — Ambulatory Visit: Payer: Medicaid Other

## 2019-07-17 ENCOUNTER — Ambulatory Visit: Payer: Medicaid Other | Attending: Obstetrics and Gynecology

## 2019-07-17 ENCOUNTER — Other Ambulatory Visit: Payer: Self-pay

## 2019-07-17 ENCOUNTER — Other Ambulatory Visit: Payer: Self-pay | Admitting: Obstetrics and Gynecology

## 2019-07-17 DIAGNOSIS — Z363 Encounter for antenatal screening for malformations: Secondary | ICD-10-CM | POA: Diagnosis not present

## 2019-07-17 DIAGNOSIS — O43192 Other malformation of placenta, second trimester: Secondary | ICD-10-CM | POA: Diagnosis not present

## 2019-07-17 DIAGNOSIS — O99212 Obesity complicating pregnancy, second trimester: Secondary | ICD-10-CM | POA: Diagnosis not present

## 2019-07-17 DIAGNOSIS — E669 Obesity, unspecified: Secondary | ICD-10-CM

## 2019-07-17 DIAGNOSIS — Z3480 Encounter for supervision of other normal pregnancy, unspecified trimester: Secondary | ICD-10-CM

## 2019-07-20 ENCOUNTER — Other Ambulatory Visit: Payer: Self-pay | Admitting: *Deleted

## 2019-07-20 DIAGNOSIS — O43192 Other malformation of placenta, second trimester: Secondary | ICD-10-CM

## 2019-08-10 ENCOUNTER — Encounter: Payer: Medicaid Other | Admitting: Advanced Practice Midwife

## 2019-08-14 ENCOUNTER — Other Ambulatory Visit: Payer: Self-pay

## 2019-08-14 ENCOUNTER — Encounter (HOSPITAL_COMMUNITY): Payer: Self-pay | Admitting: Obstetrics and Gynecology

## 2019-08-14 ENCOUNTER — Inpatient Hospital Stay (HOSPITAL_COMMUNITY)
Admission: AD | Admit: 2019-08-14 | Discharge: 2019-08-14 | Disposition: A | Discharge status: Home / Self Care | Payer: Medicaid Other | Attending: Obstetrics and Gynecology | Admitting: Obstetrics and Gynecology

## 2019-08-14 DIAGNOSIS — Z3A24 24 weeks gestation of pregnancy: Secondary | ICD-10-CM | POA: Insufficient documentation

## 2019-08-14 DIAGNOSIS — Z3A34 34 weeks gestation of pregnancy: Secondary | ICD-10-CM | POA: Diagnosis not present

## 2019-08-14 DIAGNOSIS — F17210 Nicotine dependence, cigarettes, uncomplicated: Secondary | ICD-10-CM | POA: Insufficient documentation

## 2019-08-14 DIAGNOSIS — Z79899 Other long term (current) drug therapy: Secondary | ICD-10-CM | POA: Diagnosis not present

## 2019-08-14 DIAGNOSIS — B373 Candidiasis of vulva and vagina: Secondary | ICD-10-CM | POA: Insufficient documentation

## 2019-08-14 DIAGNOSIS — Z791 Long term (current) use of non-steroidal anti-inflammatories (NSAID): Secondary | ICD-10-CM | POA: Insufficient documentation

## 2019-08-14 DIAGNOSIS — O99342 Other mental disorders complicating pregnancy, second trimester: Secondary | ICD-10-CM | POA: Insufficient documentation

## 2019-08-14 DIAGNOSIS — O98812 Other maternal infectious and parasitic diseases complicating pregnancy, second trimester: Secondary | ICD-10-CM | POA: Insufficient documentation

## 2019-08-14 DIAGNOSIS — B3731 Acute candidiasis of vulva and vagina: Secondary | ICD-10-CM

## 2019-08-14 DIAGNOSIS — F319 Bipolar disorder, unspecified: Secondary | ICD-10-CM | POA: Insufficient documentation

## 2019-08-14 DIAGNOSIS — O99332 Smoking (tobacco) complicating pregnancy, second trimester: Secondary | ICD-10-CM | POA: Insufficient documentation

## 2019-08-14 LAB — URINALYSIS, ROUTINE W REFLEX MICROSCOPIC
Bilirubin Urine: NEGATIVE
Glucose, UA: NEGATIVE mg/dL
Hgb urine dipstick: NEGATIVE
Ketones, ur: NEGATIVE mg/dL
Leukocytes,Ua: NEGATIVE
Nitrite: NEGATIVE
Protein, ur: NEGATIVE mg/dL
Specific Gravity, Urine: 1.025 (ref 1.005–1.030)
pH: 8 (ref 5.0–8.0)

## 2019-08-14 LAB — POCT FERN TEST: POCT Fern Test: NEGATIVE

## 2019-08-14 LAB — WET PREP, GENITAL
Clue Cells Wet Prep HPF POC: NONE SEEN
Sperm: NONE SEEN
Trich, Wet Prep: NONE SEEN

## 2019-08-14 MED ORDER — MICONAZOLE NITRATE 100 MG VA SUPP: 100.0000 mg | Freq: Every day | VAGINAL | 0 refills | Status: AC

## 2019-08-14 NOTE — MAU Note (Signed)
Patient presented to MAU via EMS at [redacted]w[redacted]d with c/o vaginal bleeding and contractions that started around 8-9AM today. Patient unable to tell me how often she is contracting. Patient feels fetal movement, patient is unsure if she is leaking fluid.

## 2019-08-14 NOTE — MAU Provider Note (Signed)
Chief Complaint:  Contractions and Vaginal Bleeding  HPI: Robyn Cummings is a 24 y.o. G4P0030 at 37w4dby L/19 who presents to maternity admissions reporting concern of vaginal bleeding and contractions that started this morning between 8-9AM. Pt endorses fetal movement. Pt unsure if she is leaking fluid. Specifically pt reports intermittent bilateral lower abdominal pain described as "sharp stabs" rated 8/10 when it occurs. No medications at home for pain prior to arrival. She came to MAU today given concern of bright red spotting on the toilet paper after work this morning in addition to "wetness in underwear" before arrival. Recent anatomy scan at [id number] wnl except for marginal insertion of cord. No current medications except for PNV. History of trichomonas in pregnancy but negative TOC. Last vaginal intercourse approximately 1.5 weeks ago with nothing else in the vagina since. No vaginal pain, pruritis or burning. No urinary symptoms. No fever, chills, cough, congestion, headache, vision changes, chest pain or SOB.  HPI  Past Medical History: Past Medical History:  Diagnosis Date  . ADHD (attention deficit hyperactivity disorder)   . Anaphylactic reaction   . Anemia   . Anxiety   . Bipolar 1 disorder ([facility]  . Chronic back pain   . Dyspnea   . Migraine   . Seizures ([place]   has not had a seizure since childhood, no medications currently  . Sleep disorder   . Vaginal Pap smear, abnormal     Past obstetric history: OB History  Gravida Para Term Preterm AB Living  4 0 0 0 3 0  SAB TAB Ectopic Multiple Live Births  1 0 0        # Outcome Date GA Lbr Len/2nd Weight Sex Delivery Anes PTL Lv  4 Current           3 SAB           2 AB           1 AB             Past Surgical History: History reviewed. No pertinent surgical history.  Family History: Family History  Problem Relation Age of Onset  . Anxiety disorder Mother   . Arthritis Father   . Asthma Father   . Cancer  Maternal Aunt   . Diabetes Maternal Uncle   . Cancer Paternal Uncle   . Diabetes Paternal Grandmother   . Stroke Paternal Grandmother     Social History: Social History   Tobacco Use  . Smoking status: Current Some Day Smoker    Packs/day: 0.50    Types: Cigarettes  . Smokeless tobacco: Never Used  Vaping Use  . Vaping Use: Never used  Substance Use Topics  . Alcohol use: No  . Drug use: Not Currently    Types: Marijuana    Comment: last smoked 3 weeks ago    Allergies:  Allergies  Allergen Reactions  . Mushroom Extract Complex Anaphylaxis    Anything with mushrooms  . Lime [Lime, Sulfurated] Other (See Comments)    Skin rash   . Spinach Nausea And Vomiting    "can't have cooked spinach"    Meds:  No medications prior to admission.    ROS:  Review of Systems  Constitutional: Negative for chills, fatigue and fever.  HENT: Negative for congestion and sore throat.   Eyes: Negative for photophobia and visual disturbance.  Respiratory: Negative for cough, chest tightness and shortness of breath.   Cardiovascular: Negative for chest pain.  Gastrointestinal: Positive for abdominal pain. Negative for constipation, diarrhea, nausea and vomiting.  Genitourinary: Positive for vaginal bleeding and vaginal discharge. Negative for dysuria and vaginal pain.  Musculoskeletal: Negative for back pain.  Neurological: Negative for headaches.   I have reviewed patient's Past Medical Hx, Surgical Hx, Family Hx, Social Hx, medications and allergies.   Physical Exam   Patient Vitals for the past 24 hrs:  BP Temp Temp src Pulse Resp SpO2  08/14/19 1441 122/84 98.4 F (36.9 C) Oral 83 18 100 %  08/14/19 1300 -- -- -- -- -- 99 %  08/14/19 1244 -- -- -- -- -- 96 %  08/14/19 1243 123/79 -- -- 91 -- --  08/14/19 1239 -- 98 F (36.7 C) Oral -- (!) 22 --   Constitutional: Well-developed, well-nourished female in no acute distress.  Cardiovascular: normal rate Respiratory: normal  effort GI: Abd soft, non-tender, gravid appropriate for gestational age, no rebound or guarding MS: Extremities nontender, no edema, normal ROM Neurologic: Alert and oriented x 4. Psych: slightly anxious appearing, denies safety concerns  PELVIC EXAM: Cervix slightly erythematous with minimal friability, visually closed, without lesion, moderate chunky white discharge along vaginal vault and cervix, vaginal walls and external genitalia normal Bimanual exam: deferred given cervix visually closed   FHT:  Baseline 140, moderate variability, accelerations present, several variable decelerations appropriate for GA Contractions: none   Labs: Results for orders placed or performed during the hospital encounter of 08/14/19 (from the past 24 hour(s))  Urinalysis, Routine w reflex microscopic Urine, Clean Catch     Status: Abnormal   Collection Time: 08/14/19  1:17 PM  Result Value Ref Range   Color, Urine YELLOW YELLOW   APPearance HAZY (A) CLEAR   Specific Gravity, Urine 1.025 1.005 - 1.030   pH 8.0 5.0 - 8.0   Glucose, UA NEGATIVE NEGATIVE mg/dL   Hgb urine dipstick NEGATIVE NEGATIVE   Bilirubin Urine NEGATIVE NEGATIVE   Ketones, ur NEGATIVE NEGATIVE mg/dL   Protein, ur NEGATIVE NEGATIVE mg/dL   Nitrite NEGATIVE NEGATIVE   Leukocytes,Ua NEGATIVE NEGATIVE  Wet prep, genital     Status: Abnormal   Collection Time: 08/14/19  1:59 PM   Specimen: PATH Cytology Cervicovaginal Ancillary Only  Result Value Ref Range   Yeast Wet Prep HPF POC PRESENT (A) NONE SEEN   Trich, Wet Prep NONE SEEN NONE SEEN   Clue Cells Wet Prep HPF POC NONE SEEN NONE SEEN   WBC, Wet Prep HPF POC MANY (A) NONE SEEN   Sperm NONE SEEN   POCT fern test     Status: None   Collection Time: 08/14/19  2:46 PM  Result Value Ref Range   POCT Fern Test Negative = intact amniotic membranes    O/Positive/-- (05/14 1059)  Imaging:  [place] MFM OB DETAIL +14 WK  Result Date:  07/17/2019 ----------------------------------------------------------------------  OBSTETRICS REPORT                       (Signed Final 07/17/2019 04:20 pm) ---------------------------------------------------------------------- Patient Info  ID #:       [id number]                          D.O.B.:  November 25, 1995 (24 yrs)  Name:       Mart Piggs               Visit Date: 07/17/2019 03:13 pm ---------------------------------------------------------------------- Performed By  Attending:  Corenthian Booker      Ref. Address:     [address] [place]                    [address]                                                             [address]  Performed By:     Novella Rob        [facility]                                                             [facility] [place] [facility]                                                             [facility]  Referred By:      Sloan Leiter                    MD ---------------------------------------------------------------------- Orders  #  Description                           Code        Ordered By  1  [place] MFM OB DETAIL +14 WK               76811.01    KELLY DAVIS ----------------------------------------------------------------------  #  Order #                     Accession #                Episode #  1  [id number]                   [id number]                 [id number] ---------------------------------------------------------------------- Indications  Marginal insertion of umbilical cord affecting O43.192  management of mother in second trimester  Obesity complicating pregnancy, second         O99.212  trimester (BMI 34)  [redacted] weeks gestation of pregnancy                Z3A.20  Encounter for antenatal screening for          Z36.3  malformations (LR NIPS, neg APF) ---------------------------------------------------------------------- Vital Signs  Weight (lb): 194  Height:         5'4"  BMI:         34.84 ---------------------------------------------------------------------- Fetal Evaluation  Num Of Fetuses:         1  Fetal Heart Rate(bpm):  157  Cardiac Activity:       Observed  Presentation:           Breech  Placenta:               Posterior  P. Cord Insertion:      Marginal insertion  Amniotic Fluid  AFI FV:      Within normal limits                              Largest Pocket(cm)                              4.4 ---------------------------------------------------------------------- Biometry                                                          FL/HC:      18.1   %    15.9 - 20.3  HC:      199.3  mm     G. Age:  22w 1d         94  %    HC/AC:      1.18        1.06 - 1.25  AC:      168.8  mm     G. Age:  21w 6d         83  %  FL:         36  mm     G. Age:  67w 3d         71  %    FL/AC:      21.3   %    20 - 24  CER:        23  mm     G. Age:  21w 3d         87  %  LV:        4.5  mm  CM:        2.4  mm  Est. FW:     446  gm           1 lb     95  % ---------------------------------------------------------------------- OB History  Gravidity:    3 ---------------------------------------------------------------------- Gestational Age  LMP:           20w 4d        Date:  02/23/19                 EDD:   11/30/19  U/S Today:     21w 6d                                        EDD:   11/21/19  Best:          Hyacinth Meeker 4d     Det. By:  LMP  (02/23/19)  EDD:   11/30/19 ---------------------------------------------------------------------- Anatomy  Cranium:               Appears normal         Aortic Arch:            Appears normal  Cavum:                 Appears normal         Ductal Arch:            Visualized color                                                                        doppler  Ventricles:            Appears normal         Diaphragm:              Appears normal  Choroid Plexus:        Appears normal         Stomach:                Appears normal, left                                                                         sided  Cerebellum:            Appears normal         Abdomen:                Appears normal  Posterior Fossa:       Appears normal         Abdominal Wall:         Appears nml (cord                                                                        insert, abd wall)  Nuchal Fold:           Not applicable (>97    Cord Vessels:           Appears normal ([redacted]                         wks GA)                                        vessel cord)  Face:                  Appears normal         Kidneys:  Appear normal                         (orbits and profile)  Lips:                  Appears normal         Bladder:                Appears normal  Thoracic:              Appears normal         Spine:                  Appears normal  Heart:                 Appears normal         Upper Extremities:      Appears normal                         (4CH, axis, and                         situs)  RVOT:                  Appears normal         Lower Extremities:      Appears normal  LVOT:                  Appears normal  Other:  Fetus appears to be female. Heels and 5th digit visualized. Nasal          bone visualized. Open hands visualized. ---------------------------------------------------------------------- Cervix Uterus Adnexa  Cervix  Length:           4.06  cm.  Normal appearance by transabdominal scan.  Uterus  No abnormality visualized.  Right Ovary  No adnexal mass visualized.  Left Ovary  No adnexal mass visualized.  Cul De Sac  No free fluid seen.  Adnexa  No abnormality visualized. ---------------------------------------------------------------------- Impression  Single intrauterine pregnancy here for a detailed anatomy  Normal anatomy with measurements consistent with dates  There is good fetal movement and amniotic fluid volume  Known low risk NIPS and neg AFP.  Marginal cord insertion was observed today. I discussed that  a marginal cord insertion can be  associated with fetal growth  delays. Given today's finding I recommend follow up growth  in 6-8 weeks. ---------------------------------------------------------------------- Recommendations  Follow up growth in 6-8 weeks. ----------------------------------------------------------------------               Sander Nephew, MD Electronically Signed Final Report   07/17/2019 04:20 pm ----------------------------------------------------------------------   MAU Course/MDM: Orders Placed This Encounter  Procedures  . Wet prep, genital  . Urinalysis, Routine w reflex microscopic  . POCT fern test  . Discharge patient    Meds ordered this encounter  Medications  . miconazole (MICOTIN) 100 MG vaginal suppository    Sig: Place 1 suppository (100 mg total) vaginally at bedtime for 7 days. For treatment of yeast infection.    Dispense:  7 suppository    Refill:  0     NST reviewed Provided script for miconazole given yeast on wet prep. Pt discharge with strict preterm labor and bleeding precautions.  Assessment & Plan: 1. Vaginal yeast infection: Pt presents to triage given concern of slight spotting and "wetness in underwear" this morning.  Reassuringly, NST appropriate for gestational age and no signs of preterm labor on pelvic exam. No evidence of PPROM given absence of ferning and no pooling on exam. Wet prep and spec exam confirmed diagnosis of vaginal yeast infection. -provided script for miconazole 163m vaginal suppository x7d -Preterm labor and bleeding precautions -Discharge home with plans to follow-up with prenatal provider as scheduled on 8/16.   Allergies as of 08/14/2019      Reactions   Mushroom Extract Complex Anaphylaxis   Anything with mushrooms   Lime [lime, Sulfurated] Other (See Comments)   Skin rash    Spinach Nausea And Vomiting   "can't have cooked spinach"      Medication List    STOP taking these medications   fluticasone 50 MCG/ACT nasal spray Commonly  known as: FLONASE   Guaifenesin 1200 MG Tb12   guaiFENesin-codeine 100-10 MG/5ML syrup   hydrocortisone 25 MG suppository Commonly known as: ANUSOL-HC   ibuprofen 800 MG tablet Commonly known as: ADVIL   metroNIDAZOLE 500 MG tablet Commonly known as: FLAGYL   ondansetron 4 MG disintegrating tablet Commonly known as: Zofran ODT   polyethylene glycol 17 g packet Commonly known as: MiraLax   promethazine 25 MG tablet Commonly known as: PHENERGAN     TAKE these medications   albuterol 108 (90 Base) MCG/ACT inhaler Commonly known as: VENTOLIN HFA Inhale 1-2 puffs into the lungs every 6 (six) hours as needed for wheezing or shortness of breath.   benzonatate 100 MG capsule Commonly known as: TESSALON Take 1 capsule (100 mg total) by mouth 3 (three) times daily as needed for cough.   Blood Pressure Kit Devi 1 kit by Does not apply route once a week. Check Blood Pressure regularly and record readings into the [employer] App.  Large Cuff.  DX O90.0   miconazole 100 MG vaginal suppository Commonly known as: MICOTIN Place 1 suppository (100 mg total) vaginally at bedtime for 7 days. For treatment of yeast infection.   Vitafol FE+ 90-1-200 & 50 MG Cppk Take 1 tablet by mouth daily.      [place] MD OB Fellow, [facility] 08/14/2019 3:46 PM

## 2019-08-14 NOTE — Discharge Instructions (Signed)
No sign of preterm labor on exam. Your cervical swab was positive for yeast. Take miconazole vaginal suppository nightly for 7 days. Please keep your prenatal appointment or call sooner if concerns of regular contractions, leakage of fluid, increased vaginal bleeding or decreased fetal movement.  Vaginal Yeast Infection, Adult  Vaginal yeast infection is a condition that causes vaginal discharge as well as soreness, swelling, and redness (inflammation) of the vagina. This is a common condition. Some women get this infection frequently. What are the causes? This condition is caused by a change in the normal balance of the yeast (candida) and bacteria that live in the vagina. This change causes an overgrowth of yeast, which causes the inflammation. What increases the risk? The condition is more likely to develop in women who:  Take antibiotic medicines.  Have diabetes.  Take birth control pills.  Are pregnant.  Douche often.  Have a weak body defense system (immune system).  Have been taking steroid medicines for a long time.  Frequently wear tight clothing. What are the signs or symptoms? Symptoms of this condition include:  White, thick, creamy vaginal discharge.  Swelling, itching, redness, and irritation of the vagina. The lips of the vagina (vulva) may be affected as well.  Pain or a burning feeling while urinating.  Pain during sex. How is this diagnosed? This condition is diagnosed based on:  Your medical history.  A physical exam.  A pelvic exam. Your health care provider will examine a sample of your vaginal discharge under a microscope. Your health care provider may send this sample for testing to confirm the diagnosis. How is this treated? This condition is treated with medicine. Medicines may be over-the-counter or prescription. You may be told to use one or more of the following:  Medicine that is taken by mouth (orally).  Medicine that is applied as a  cream (topically).  Medicine that is inserted directly into the vagina (suppository). Follow these instructions at home:  Lifestyle  Do not have sex until your health care provider approves. Tell your sex partner that you have a yeast infection. That person should go to his or her health care provider and ask if they should also be treated.  Do not wear tight clothes, such as pantyhose or tight pants.  Wear breathable cotton underwear. General instructions  Take or apply over-the-counter and prescription medicines only as told by your health care provider.  Eat more yogurt. This may help to keep your yeast infection from returning.  Do not use tampons until your health care provider approves.  Try taking a sitz bath to help with discomfort. This is a warm water bath that is taken while you are sitting down. The water should only come up to your hips and should cover your buttocks. Do this 3-4 times per day or as told by your health care provider.  Do not douche.  If you have diabetes, keep your blood sugar levels under control.  Keep all follow-up visits as told by your health care provider. This is important. Contact a health care provider if:  You have a fever.  Your symptoms go away and then return.  Your symptoms do not get better with treatment.  Your symptoms get worse.  You have new symptoms.  You develop blisters in or around your vagina.  You have blood coming from your vagina and it is not your menstrual period.  You develop pain in your abdomen. Summary  Vaginal yeast infection is a condition that  causes discharge as well as soreness, swelling, and redness (inflammation) of the vagina.  This condition is treated with medicine. Medicines may be over-the-counter or prescription.  Take or apply over-the-counter and prescription medicines only as told by your health care provider.  Do not douche. Do not have sex or use tampons until your health care provider  approves.  Contact a health care provider if your symptoms do not get better with treatment or your symptoms go away and then return. This information is not intended to replace advice given to you by your health care provider. Make sure you discuss any questions you have with your health care provider. Document Revised: 07/18/2018 Document Reviewed: 05/06/2017 Elsevier Patient Education  2020 [employer].

## 2019-08-17 ENCOUNTER — Other Ambulatory Visit: Payer: Self-pay

## 2019-08-17 ENCOUNTER — Encounter: Payer: Self-pay | Admitting: Obstetrics

## 2019-08-17 ENCOUNTER — Ambulatory Visit (INDEPENDENT_AMBULATORY_CARE_PROVIDER_SITE_OTHER): Payer: Medicaid Other | Admitting: Advanced Practice Midwife

## 2019-08-17 VITALS — BP 133/77 | HR 95 | Wt 212.6 lb

## 2019-08-17 DIAGNOSIS — Z3492 Encounter for supervision of normal pregnancy, unspecified, second trimester: Secondary | ICD-10-CM

## 2019-08-17 DIAGNOSIS — O26812 Pregnancy related exhaustion and fatigue, second trimester: Secondary | ICD-10-CM

## 2019-08-17 DIAGNOSIS — O0990 Supervision of high risk pregnancy, unspecified, unspecified trimester: Secondary | ICD-10-CM

## 2019-08-17 LAB — GC/CHLAMYDIA PROBE AMP (~~LOC~~) NOT AT ARMC
Chlamydia: NEGATIVE
Comment: NEGATIVE
Comment: NORMAL
Neisseria Gonorrhea: NEGATIVE

## 2019-08-17 NOTE — Progress Notes (Signed)
Patient reports fetal movement, denies pain. 

## 2019-08-17 NOTE — Patient Instructions (Signed)
Considering Waterbirth? °Guide for patients at [facility] °Why consider waterbirth? °• Gentle birth for babies  °• Less pain medicine used in labor  °• May allow for passive descent/less pushing  °• May reduce perineal tears  °• More mobility and instinctive maternal position changes  °• Increased maternal relaxation  °• Reduced blood pressure in labor  ° °Is waterbirth safe? What are the risks of infection, drowning or other complications? °• Infection:  °• Very low risk (3.7 % for tub vs 4.8% for bed)  °• 7 in 8000 waterbirths with documented infection  °• Poorly cleaned equipment most common cause  °• Slightly lower group B strep transmission rate  °• Drowning  °• Maternal:  °• Very low risk  °• Related to seizures or fainting  °• Newborn:  °• Very low risk. No evidence of increased risk of respiratory problems in multiple large studies  °• Physiological protection from breathing under water  °• Avoid underwater birth if there are any fetal complications  °• Once baby's head is out of the water, keep it out.  °• Birth complication  °• Some reports of cord trauma, but risk decreased by bringing baby to surface gradually  °• No evidence of increased risk of shoulder dystocia. Mothers can usually change positions faster in water than in a bed, possibly aiding the maneuvers to free the shoulder.  °? °You must attend a Waterbirth class at [facility] [place] °• 3rd Wednesday of every month from 7-9pm  °• Free  °• Register by calling [phone] or online at [email or web address]  °• Bring us the certificate from the class to your prenatal appointment  °Meet with a midwife at 36 weeks to see if you can still plan a waterbirth and to sign the consent.  ° °If you plan a waterbirth at [facility] [place], you can opt to purchase the following: °• Fish Net °• Bathing suit top (optional)  °• Long-handled mirror (optional)  °•  °Things that would  prevent you from having a waterbirth: °• Unknown or Positive COVID-19 diagnosis upon admission to hospital  °• Premature, <37wks  °• Previous cesarean birth  °• Presence of thick meconium-stained fluid  °• Multiple gestation (Twins, triplets, etc.)  °• Uncontrolled diabetes or gestational diabetes requiring medication  °• Hypertension requiring medication or diagnosis of pre-eclampsia  °• Heavy vaginal bleeding  °• Non-reassuring fetal heart rate  °• Active infection (MRSA, etc.). Group B Strep is NOT a contraindication for waterbirth.  °• If your labor has to be induced and induction method requires continuous monitoring of the baby's heart rate  °• Other risks/issues identified by your obstetrical provider  °Please remember that birth is unpredictable. Under certain unforeseeable circumstances your provider may advise against giving birth in the tub. These decisions will be made on a case-by-case basis and with the safety of you and your baby as our highest priority. ° °**Please remember that in order to have a waterbirth, you must test Negative to COVID-19 upon admission to the hospital.** ° °

## 2019-08-17 NOTE — Progress Notes (Signed)
   PRENATAL VISIT NOTE  Subjective:  Robyn Cummings is a 24 y.o. G4P0030 at [redacted]w[redacted]d being seen today for ongoing prenatal care.  She is currently monitored for the following issues for this low-risk pregnancy and has Posttraumatic amnesia; Cannabis abuse; Supervision of high risk pregnancy, antepartum; and Trichomonas infection on their problem list.  Patient reports fatigue.  Contractions: Not present. Vag. Bleeding: None.  Movement: Present. Denies leaking of fluid.   The following portions of the patient's history were reviewed and updated as appropriate: allergies, current medications, past family history, past medical history, past social history, past surgical history and problem list.   Objective:   Vitals:   08/17/19 1627  BP: 133/77  Pulse: 95  Weight: 212 lb 9.6 oz (96.4 kg)    Fetal Status: Fetal Heart Rate (bpm): 141   Movement: Present     General:  Alert, oriented and cooperative. Patient is in no acute distress.  Skin: Skin is warm and dry. No rash noted.   Cardiovascular: Normal heart rate noted  Respiratory: Normal respiratory effort, no problems with respiration noted  Abdomen: Soft, gravid, appropriate for gestational age.  Pain/Pressure: Absent     Pelvic: Cervical exam deferred        Extremities: Normal range of motion.  Edema: Trace  Mental Status: Normal mood and affect. Normal behavior. Normal judgment and thought content.   Assessment and Plan:  Pregnancy: G4P0030 at [redacted]w[redacted]d 1. Encounter for supervision of low-risk pregnancy in second trimester --Anticipatory guidance about next visits/weeks of pregnancy given. --Next visit in 3 weeks for glucose test  3. Fatigue during pregnancy, antepartum, second trimester --Pt reports fatigue throughout pregnancy, despite sleeping well at night.  --Discussed sleep hygiene for better sleep and Ok to use small amounts of caffeine during the day  Preterm labor symptoms and general obstetric precautions including but  not limited to vaginal bleeding, contractions, leaking of fluid and fetal movement were reviewed in detail with the patient. Please refer to After Visit Summary for other counseling recommendations.   Return in about 3 weeks (around 09/07/2019).  Future Appointments  Date Time Provider Department Center  09/04/2019  1:45 PM [facility] NURSE [facility]  09/04/2019  2:00 PM [facility] US1 [facility]  09/09/2019  8:00 AM [facility] LAB [facility] None  09/09/2019  8:55 AM [facility]Kir[facility], Wilmer Floor, CNM [facility] None    Sharen Counter, CNM

## 2019-09-04 ENCOUNTER — Other Ambulatory Visit: Payer: Self-pay

## 2019-09-04 ENCOUNTER — Encounter: Payer: Self-pay | Admitting: *Deleted

## 2019-09-04 ENCOUNTER — Ambulatory Visit: Payer: Medicaid Other | Attending: Obstetrics and Gynecology

## 2019-09-04 ENCOUNTER — Ambulatory Visit: Payer: Medicaid Other | Admitting: *Deleted

## 2019-09-04 ENCOUNTER — Other Ambulatory Visit: Payer: Self-pay | Admitting: *Deleted

## 2019-09-04 VITALS — BP 116/72 | HR 98

## 2019-09-04 DIAGNOSIS — Z3A27 27 weeks gestation of pregnancy: Secondary | ICD-10-CM

## 2019-09-04 DIAGNOSIS — Z362 Encounter for other antenatal screening follow-up: Secondary | ICD-10-CM | POA: Diagnosis not present

## 2019-09-04 DIAGNOSIS — O43199 Other malformation of placenta, unspecified trimester: Secondary | ICD-10-CM | POA: Diagnosis present

## 2019-09-04 DIAGNOSIS — O99212 Obesity complicating pregnancy, second trimester: Secondary | ICD-10-CM

## 2019-09-04 DIAGNOSIS — O43192 Other malformation of placenta, second trimester: Secondary | ICD-10-CM | POA: Diagnosis not present

## 2019-09-04 DIAGNOSIS — O402XX0 Polyhydramnios, second trimester, not applicable or unspecified: Secondary | ICD-10-CM

## 2019-09-04 DIAGNOSIS — E669 Obesity, unspecified: Secondary | ICD-10-CM

## 2019-09-04 DIAGNOSIS — Z6834 Body mass index (BMI) 34.0-34.9, adult: Secondary | ICD-10-CM

## 2019-09-09 ENCOUNTER — Other Ambulatory Visit: Payer: Medicaid Other

## 2019-09-09 ENCOUNTER — Ambulatory Visit (INDEPENDENT_AMBULATORY_CARE_PROVIDER_SITE_OTHER): Payer: Medicaid Other | Admitting: Advanced Practice Midwife

## 2019-09-09 ENCOUNTER — Encounter: Payer: Self-pay | Admitting: Advanced Practice Midwife

## 2019-09-09 ENCOUNTER — Other Ambulatory Visit: Payer: Self-pay

## 2019-09-09 VITALS — BP 128/88 | HR 86 | Wt 216.0 lb

## 2019-09-09 DIAGNOSIS — O26813 Pregnancy related exhaustion and fatigue, third trimester: Secondary | ICD-10-CM

## 2019-09-09 DIAGNOSIS — D509 Iron deficiency anemia, unspecified: Secondary | ICD-10-CM

## 2019-09-09 DIAGNOSIS — O99013 Anemia complicating pregnancy, third trimester: Secondary | ICD-10-CM

## 2019-09-09 DIAGNOSIS — O0990 Supervision of high risk pregnancy, unspecified, unspecified trimester: Secondary | ICD-10-CM

## 2019-09-09 NOTE — Patient Instructions (Signed)
Third Trimester of Pregnancy The third trimester is from week 28 through week 40 (months 7 through 9). The third trimester is a time when the unborn baby (fetus) is growing rapidly. At the end of the ninth month, the fetus is about 20 inches in length and weighs 6-10 pounds. Body changes during your third trimester Your body will continue to go through many changes during pregnancy. The changes vary from woman to woman. During the third trimester:  Your weight will continue to increase. You can expect to gain 25-35 pounds (11-16 kg) by the end of the pregnancy.  You may begin to get stretch marks on your hips, abdomen, and breasts.  You may urinate more often because the fetus is moving lower into your pelvis and pressing on your bladder.  You may develop or continue to have heartburn. This is caused by increased hormones that slow down muscles in the digestive tract.  You may develop or continue to have constipation because increased hormones slow digestion and cause the muscles that push waste through your intestines to relax.  You may develop hemorrhoids. These are swollen veins (varicose veins) in the rectum that can itch or be painful.  You may develop swollen, bulging veins (varicose veins) in your legs.  You may have increased body aches in the pelvis, back, or thighs. This is due to weight gain and increased hormones that are relaxing your joints.  You may have changes in your hair. These can include thickening of your hair, rapid growth, and changes in texture. Some women also have hair loss during or after pregnancy, or hair that feels dry or thin. Your hair will most likely return to normal after your baby is born.  Your breasts will continue to grow and they will continue to become tender. A yellow fluid (colostrum) may leak from your breasts. This is the first milk you are producing for your baby.  Your belly button may stick out.  You may notice more swelling in your hands,  face, or ankles.  You may have increased tingling or numbness in your hands, arms, and legs. The skin on your belly may also feel numb.  You may feel short of breath because of your expanding uterus.  You may have more problems sleeping. This can be caused by the size of your belly, increased need to urinate, and an increase in your body's metabolism.  You may notice the fetus "dropping," or moving lower in your abdomen (lightening).  You may have increased vaginal discharge.  You may notice your joints feel loose and you may have pain around your pelvic bone. What to expect at prenatal visits You will have prenatal exams every 2 weeks until week 36. Then you will have weekly prenatal exams. During a routine prenatal visit:  You will be weighed to make sure you and the baby are growing normally.  Your blood pressure will be taken.  Your abdomen will be measured to track your baby's growth.  The fetal heartbeat will be listened to.  Any test results from the previous visit will be discussed.  You may have a cervical check near your due date to see if your cervix has softened or thinned (effaced).  You will be tested for Group B streptococcus. This happens between 35 and 37 weeks. Your health care provider may ask you:  What your birth plan is.  How you are feeling.  If you are feeling the baby move.  If you have had any abnormal   symptoms, such as leaking fluid, bleeding, severe headaches, or abdominal cramping.  If you are using any tobacco products, including cigarettes, chewing tobacco, and electronic cigarettes.  If you have any questions. Other tests or screenings that may be performed during your third trimester include:  Blood tests that check for low iron levels (anemia).  Fetal testing to check the health, activity level, and growth of the fetus. Testing is done if you have certain medical conditions or if there are problems during the pregnancy.  Nonstress test  (NST). This test checks the health of your baby to make sure there are no signs of problems, such as the baby not getting enough oxygen. During this test, a belt is placed around your belly. The baby is made to move, and its heart rate is monitored during movement. What is false labor? False labor is a condition in which you feel small, irregular tightenings of the muscles in the womb (contractions) that usually go away with rest, changing position, or drinking water. These are called Braxton Hicks contractions. Contractions may last for hours, days, or even weeks before true labor sets in. If contractions come at regular intervals, become more frequent, increase in intensity, or become painful, you should see your health care provider. What are the signs of labor?  Abdominal cramps.  Regular contractions that start at 10 minutes apart and become stronger and more frequent with time.  Contractions that start on the top of the uterus and spread down to the lower abdomen and back.  Increased pelvic pressure and dull back pain.  A watery or bloody mucus discharge that comes from the vagina.  Leaking of amniotic fluid. This is also known as your "water breaking." It could be a slow trickle or a gush. Let your health care provider know if it has a color or strange odor. If you have any of these signs, call your health care provider right away, even if it is before your due date. Follow these instructions at home: Medicines  Follow your health care provider's instructions regarding medicine use. Specific medicines may be either safe or unsafe to take during pregnancy.  Take a prenatal vitamin that contains at least 600 micrograms (mcg) of folic acid.  If you develop constipation, try taking a stool softener if your health care provider approves. Eating and drinking   Eat a balanced diet that includes fresh fruits and vegetables, whole grains, good sources of protein such as meat, eggs, or tofu,  and low-fat dairy. Your health care provider will help you determine the amount of weight gain that is right for you.  Avoid raw meat and uncooked cheese. These carry germs that can cause birth defects in the baby.  If you have low calcium intake from food, talk to your health care provider about whether you should take a daily calcium supplement.  Eat four or five small meals rather than three large meals a day.  Limit foods that are high in fat and processed sugars, such as fried and sweet foods.  To prevent constipation: ? Drink enough fluid to keep your urine clear or pale yellow. ? Eat foods that are high in fiber, such as fresh fruits and vegetables, whole grains, and beans. Activity  Exercise only as directed by your health care provider. Most women can continue their usual exercise routine during pregnancy. Try to exercise for 30 minutes at least 5 days a week. Stop exercising if you experience uterine contractions.  Avoid heavy lifting.  Do   not exercise in extreme heat or humidity, or at high altitudes.  Wear low-heel, comfortable shoes.  Practice good posture.  You may continue to have sex unless your health care provider tells you otherwise. Relieving pain and discomfort  Take frequent breaks and rest with your legs elevated if you have leg cramps or low back pain.  Take warm sitz baths to soothe any pain or discomfort caused by hemorrhoids. Use hemorrhoid cream if your health care provider approves.  Wear a good support bra to prevent discomfort from breast tenderness.  If you develop varicose veins: ? Wear support pantyhose or compression stockings as told by your healthcare provider. ? Elevate your feet for 15 minutes, 3-4 times a day. Prenatal care  Write down your questions. Take them to your prenatal visits.  Keep all your prenatal visits as told by your health care provider. This is important. Safety  Wear your seat belt at all times when driving.  Make  a list of emergency phone numbers, including numbers for family, friends, the hospital, and police and fire departments. General instructions  Avoid cat litter boxes and soil used by cats. These carry germs that can cause birth defects in the baby. If you have a cat, ask someone to clean the litter box for you.  Do not travel far distances unless it is absolutely necessary and only with the approval of your health care provider.  Do not use hot tubs, steam rooms, or saunas.  Do not drink alcohol.  Do not use any products that contain nicotine or tobacco, such as cigarettes and e-cigarettes. If you need help quitting, ask your health care provider.  Do not use any medicinal herbs or unprescribed drugs. These chemicals affect the formation and growth of the baby.  Do not douche or use tampons or scented sanitary pads.  Do not cross your legs for long periods of time.  To prepare for the arrival of your baby: ? Take prenatal classes to understand, practice, and ask questions about labor and delivery. ? Make a trial run to the hospital. ? Visit the hospital and tour the maternity area. ? Arrange for maternity or paternity leave through employers. ? Arrange for family and friends to take care of pets while you are in the hospital. ? Purchase a rear-facing car seat and make sure you know how to install it in your car. ? Pack your hospital bag. ? Prepare the baby's nursery. Make sure to remove all pillows and stuffed animals from the baby's crib to prevent suffocation.  Visit your dentist if you have not gone during your pregnancy. Use a soft toothbrush to brush your teeth and be gentle when you floss. Contact a health care provider if:  You are unsure if you are in labor or if your water has broken.  You become dizzy.  You have mild pelvic cramps, pelvic pressure, or nagging pain in your abdominal area.  You have lower back pain.  You have persistent nausea, vomiting, or  diarrhea.  You have an unusual or bad smelling vaginal discharge.  You have pain when you urinate. Get help right away if:  Your water breaks before 37 weeks.  You have regular contractions less than 5 minutes apart before 37 weeks.  You have a fever.  You are leaking fluid from your vagina.  You have spotting or bleeding from your vagina.  You have severe abdominal pain or cramping.  You have rapid weight loss or weight gain.  You have   shortness of breath with chest pain.  You notice sudden or extreme swelling of your face, hands, ankles, feet, or legs.  Your baby makes fewer than 10 movements in 2 hours.  You have severe headaches that do not go away when you take medicine.  You have vision changes. Summary  The third trimester is from week 28 through week 40, months 7 through 9. The third trimester is a time when the unborn baby (fetus) is growing rapidly.  During the third trimester, your discomfort may increase as you and your baby continue to gain weight. You may have abdominal, leg, and back pain, sleeping problems, and an increased need to urinate.  During the third trimester your breasts will keep growing and they will continue to become tender. A yellow fluid (colostrum) may leak from your breasts. This is the first milk you are producing for your baby.  False labor is a condition in which you feel small, irregular tightenings of the muscles in the womb (contractions) that eventually go away. These are called Braxton Hicks contractions. Contractions may last for hours, days, or even weeks before true labor sets in.  Signs of labor can include: abdominal cramps; regular contractions that start at 10 minutes apart and become stronger and more frequent with time; watery or bloody mucus discharge that comes from the vagina; increased pelvic pressure and dull back pain; and leaking of amniotic fluid. This information is not intended to replace advice given to you by your  health care provider. Make sure you discuss any questions you have with your health care provider. Document Revised: 04/10/2018 Document Reviewed: 01/24/2016 Elsevier Patient Education  2020 [employer]  

## 2019-09-09 NOTE — Progress Notes (Signed)
Pt presents for ROB and 2 gtt labs. Tdap and flu vaccines offered; pt declined.    

## 2019-09-09 NOTE — Progress Notes (Signed)
   PRENATAL VISIT NOTE  Subjective:  Robyn Cummings is a 24 y.o. G4P0030 at [redacted]w[redacted]d being seen today for ongoing prenatal care.  She is currently monitored for the following issues for this low-risk pregnancy and has Posttraumatic amnesia; Cannabis abuse; Supervision of high risk pregnancy, antepartum; and Trichomonas infection on their problem list.  Patient reports no complaints.  Contractions: Not present. Vag. Bleeding: None.  Movement: Present. Denies leaking of fluid.   The following portions of the patient's history were reviewed and updated as appropriate: allergies, current medications, past family history, past medical history, past social history, past surgical history and problem list.   Objective:   Vitals:   09/09/19 0821  BP: 128/88  Pulse: 86  Weight: 216 lb (98 kg)    Fetal Status: Fetal Heart Rate (bpm): 160  Fundal Height: 29 cm Movement: Present     General:  Alert, oriented and cooperative. Patient is in no acute distress.  Skin: Skin is warm and dry. No rash noted.   Cardiovascular: Normal heart rate noted  Respiratory: Normal respiratory effort, no problems with respiration noted  Abdomen: Soft, gravid, appropriate for gestational age.  Pain/Pressure: Present     Pelvic: Cervical exam deferred        Extremities: Normal range of motion.  Edema: Trace  Mental Status: Normal mood and affect. Normal behavior. Normal judgment and thought content.   Assessment and Plan:  Pregnancy: G4P0030 at [redacted]w[redacted]d 1. Supervision of high risk pregnancy, antepartum --Anticipatory guidance about next visits/weeks of pregnancy given. --Desires waterbirth, is registered for class in October  - Glucose Tolerance, 2 Hours w/1 Hour - HIV Antibody (routine testing w rflx) - RPR - CBC  --Next visit in 4 weeks, in person, unsure about home cuff, did not bring today  Preterm labor symptoms and general obstetric precautions including but not limited to vaginal bleeding, contractions,  leaking of fluid and fetal movement were reviewed in detail with the patient. Please refer to After Visit Summary for other counseling recommendations.   Return in about 4 weeks (around 10/07/2019).  Future Appointments  Date Time Provider Department Center  10/02/2019  1:45 PM [facility] NURSE [facility]  10/02/2019  2:00 PM [facility] US1 [facility]    Sharen Counter, CNM

## 2019-09-10 LAB — GLUCOSE TOLERANCE, 2 HOURS W/ 1HR
Glucose, 1 hour: 82 mg/dL (ref 65–179)
Glucose, 2 hour: 60 mg/dL — ABNORMAL LOW (ref 65–152)
Glucose, Fasting: 84 mg/dL (ref 65–91)

## 2019-09-10 LAB — CBC
Hematocrit: 30.8 % — ABNORMAL LOW (ref 34.0–46.6)
Hemoglobin: 10.2 g/dL — ABNORMAL LOW (ref 11.1–15.9)
MCH: 28.9 pg (ref 26.6–33.0)
MCHC: 33.1 g/dL (ref 31.5–35.7)
MCV: 87 fL (ref 79–97)
Platelets: 313 10*3/uL (ref 150–450)
RBC: 3.53 x10E6/uL — ABNORMAL LOW (ref 3.77–5.28)
RDW: 13 % (ref 11.7–15.4)
WBC: 11.6 10*3/uL — ABNORMAL HIGH (ref 3.4–10.8)

## 2019-09-10 LAB — RPR: RPR Ser Ql: NONREACTIVE

## 2019-09-10 LAB — HIV ANTIBODY (ROUTINE TESTING W REFLEX): HIV Screen 4th Generation wRfx: NONREACTIVE

## 2019-09-15 ENCOUNTER — Encounter (HOSPITAL_COMMUNITY): Payer: Self-pay | Admitting: Obstetrics & Gynecology

## 2019-09-15 ENCOUNTER — Inpatient Hospital Stay (HOSPITAL_COMMUNITY)
Admission: AD | Admit: 2019-09-15 | Discharge: 2019-09-16 | Disposition: A | Discharge status: Home / Self Care | Payer: Medicaid Other | Attending: Obstetrics & Gynecology | Admitting: Obstetrics & Gynecology

## 2019-09-15 ENCOUNTER — Other Ambulatory Visit: Payer: Self-pay

## 2019-09-15 DIAGNOSIS — O409XX0 Polyhydramnios, unspecified trimester, not applicable or unspecified: Secondary | ICD-10-CM

## 2019-09-15 DIAGNOSIS — Z3A29 29 weeks gestation of pregnancy: Secondary | ICD-10-CM | POA: Diagnosis not present

## 2019-09-15 DIAGNOSIS — Z79899 Other long term (current) drug therapy: Secondary | ICD-10-CM | POA: Diagnosis not present

## 2019-09-15 DIAGNOSIS — F17210 Nicotine dependence, cigarettes, uncomplicated: Secondary | ICD-10-CM | POA: Diagnosis not present

## 2019-09-15 DIAGNOSIS — O26899 Other specified pregnancy related conditions, unspecified trimester: Secondary | ICD-10-CM

## 2019-09-15 DIAGNOSIS — R1084 Generalized abdominal pain: Secondary | ICD-10-CM | POA: Insufficient documentation

## 2019-09-15 DIAGNOSIS — O99333 Smoking (tobacco) complicating pregnancy, third trimester: Secondary | ICD-10-CM | POA: Insufficient documentation

## 2019-09-15 DIAGNOSIS — Z5941 Food insecurity: Secondary | ICD-10-CM

## 2019-09-15 DIAGNOSIS — O403XX0 Polyhydramnios, third trimester, not applicable or unspecified: Secondary | ICD-10-CM | POA: Diagnosis not present

## 2019-09-15 DIAGNOSIS — O26893 Other specified pregnancy related conditions, third trimester: Secondary | ICD-10-CM | POA: Diagnosis not present

## 2019-09-15 DIAGNOSIS — Z658 Other specified problems related to psychosocial circumstances: Secondary | ICD-10-CM | POA: Insufficient documentation

## 2019-09-15 DIAGNOSIS — O43193 Other malformation of placenta, third trimester: Secondary | ICD-10-CM | POA: Insufficient documentation

## 2019-09-15 DIAGNOSIS — N898 Other specified noninflammatory disorders of vagina: Secondary | ICD-10-CM

## 2019-09-15 DIAGNOSIS — N859 Noninflammatory disorder of uterus, unspecified: Secondary | ICD-10-CM | POA: Diagnosis not present

## 2019-09-15 DIAGNOSIS — N858 Other specified noninflammatory disorders of uterus: Secondary | ICD-10-CM | POA: Diagnosis not present

## 2019-09-15 DIAGNOSIS — Z594 Lack of adequate food and safe drinking water: Secondary | ICD-10-CM | POA: Diagnosis not present

## 2019-09-15 DIAGNOSIS — R109 Unspecified abdominal pain: Secondary | ICD-10-CM | POA: Diagnosis not present

## 2019-09-15 HISTORY — DX: Polyhydramnios, unspecified trimester, not applicable or unspecified: O40.9XX0

## 2019-09-15 LAB — CBC
HCT: 33.1 % — ABNORMAL LOW (ref 36.0–46.0)
Hemoglobin: 10.7 g/dL — ABNORMAL LOW (ref 12.0–15.0)
MCH: 28.5 pg (ref 26.0–34.0)
MCHC: 32.3 g/dL (ref 30.0–36.0)
MCV: 88.3 fL (ref 80.0–100.0)
Platelets: 372 10*3/uL (ref 150–400)
RBC: 3.75 MIL/uL — ABNORMAL LOW (ref 3.87–5.11)
RDW: 13.1 % (ref 11.5–15.5)
WBC: 14.1 10*3/uL — ABNORMAL HIGH (ref 4.0–10.5)
nRBC: 0 % (ref 0.0–0.2)

## 2019-09-15 LAB — URINALYSIS, ROUTINE W REFLEX MICROSCOPIC
Bilirubin Urine: NEGATIVE
Glucose, UA: NEGATIVE mg/dL
Hgb urine dipstick: NEGATIVE
Ketones, ur: NEGATIVE mg/dL
Leukocytes,Ua: NEGATIVE
Nitrite: NEGATIVE
Protein, ur: NEGATIVE mg/dL
Specific Gravity, Urine: 1.024 (ref 1.005–1.030)
pH: 7 (ref 5.0–8.0)

## 2019-09-15 LAB — FETAL FIBRONECTIN: Fetal Fibronectin: NEGATIVE

## 2019-09-15 MED ORDER — FERROUS SULFATE 325 (65 FE) MG PO TABS: 325.0000 mg | ORAL_TABLET | ORAL | 2 refills | Status: DC

## 2019-09-15 MED ORDER — NIFEDIPINE 10 MG PO CAPS
10.0000 mg | ORAL_CAPSULE | ORAL | Status: DC | PRN
  Administered 2019-09-15 (×3): 10 mg via ORAL
  Filled 2019-09-15 (×3): qty 1

## 2019-09-15 NOTE — MAU Note (Signed)
Pt given meal, po hydrating.

## 2019-09-15 NOTE — MAU Provider Note (Signed)
Chief Complaint:  Abdominal Pain   First Provider Initiated Contact with Patient 09/15/19 2210     HPI: Robyn Cummings is a 24 y.o. [id number] at 53w1dho presents to maternity admissions reporting sudden onset of severe abdominal pain and leaking of fluid which started 2 hours ago.  Arrived in MAU screaming and unable to sit still or follow commands due to pain.  Rates pain "100".  Immediately checked and reassured cervix is closed, when she abruptly stopped screaming and became calm.  On next entry to room she was talking on phone. States has never had this happen before.  States has been having back and abdominal pain with lying down to sleep for the past few weeks.  Has not reported this to office providers.    States was recently treated for "infection" with Monistat.    States has not eaten in 2 weeks. When asked why states she has no food and no way to get any.  States works at [employer] her work friend (who is her sLibrarian, academicat work)  "sneaks me chicken sometimes'.  States has [place] [employer] doesn't go to grocery store "because I'd have to keep going back and forth"  Uses busline for transportation to work.  States FOB left her " a while ago" and her family is not close to her "they are all each man for himself and they don't care".  .Requested food several times, which we provided then asked friend on phone to bring her some food.  She reports good fetal movement, denies vaginal bleeding, vaginal itching/burning, h/a, dizziness, n/v, diarrhea, constipation or fever/chills.  Reports frequent urination but no dysuria.  [place] mention Polyhydramnios and Marginal Cord insertion. I added these to problem list  Abdominal Pain This is a new problem. The current episode started today. The onset quality is sudden. The problem occurs constantly. The problem has been unchanged. The pain is located in the generalized abdominal region. The pain is at a severity of 10/10. The pain is severe. The  abdominal pain radiates to the back. Associated symptoms include frequency. Pertinent negatives include no anorexia, constipation, diarrhea, dysuria, fever, myalgias, nausea or vomiting. The pain is aggravated by certain positions, movement and palpation. The pain is relieved by nothing. She has tried nothing for the symptoms.    RN Note: Pt reports severe abd pain that is constant x 2 hours, pt crying. Denies bleeding but reports ?leaking fluid.   Past Medical History: Past Medical History:  Diagnosis Date   ADHD (attention deficit hyperactivity disorder)    Anaphylactic reaction    Anemia    Anxiety    Bipolar 1 disorder (HCC)    Chronic back pain    Dyspnea    Migraine    Seizures (HCC)    has not had a seizure since childhood, no medications currently   Sleep disorder    Vaginal Pap smear, abnormal     Past obstetric history: OB History  Gravida Para Term Preterm AB Living  4 0 0 0 3 0  SAB TAB Ectopic Multiple Live Births  1 0 0        # Outcome Date GA Lbr Len/2nd Weight Sex Delivery Anes PTL Lv  4 Current           3 SAB           2 AB           1 AB  Past Surgical History: No past surgical history on file.  Family History: Family History  Problem Relation Age of Onset   Anxiety disorder Mother    Arthritis Father    Asthma Father    Cancer Maternal Aunt    Diabetes Maternal Uncle    Cancer Paternal Uncle    Diabetes Paternal Grandmother    Stroke Paternal Grandmother     Social History: Social History   Tobacco Use   Smoking status: Current Some Day Smoker    Packs/day: 0.50    Types: Cigarettes   Smokeless tobacco: Never Used  Vaping Use   Vaping Use: Never used  Substance Use Topics   Alcohol use: No   Drug use: Not Currently    Types: Marijuana    Comment: last smoked 3 weeks ago    Allergies:  Allergies  Allergen Reactions   Mushroom Extract Complex Anaphylaxis    Anything with mushrooms   Lime  [Lime, Sulfurated] Other (See Comments)    Skin rash    Spinach Nausea And Vomiting    "can't have cooked spinach"    Meds:  Medications Prior to Admission  Medication Sig Dispense Refill Last Dose   albuterol (VENTOLIN HFA) 108 (90 Base) MCG/ACT inhaler Inhale 1-2 puffs into the lungs every 6 (six) hours as needed for wheezing or shortness of breath. (Patient not taking: Reported on 05/08/2019) 6.7 g 0    benzonatate (TESSALON) 100 MG capsule Take 1 capsule (100 mg total) by mouth 3 (three) times daily as needed for cough. (Patient not taking: Reported on 05/08/2019) 21 capsule 0    Blood Pressure Monitoring (BLOOD PRESSURE KIT) DEVI 1 kit by Does not apply route once a week. Check Blood Pressure regularly and record readings into the [employer] App.  Large Cuff.  DX O90.0 (Patient not taking: Reported on 05/08/2019) 1 each 0    ferrous sulfate (FERROUSUL) 325 (65 FE) MG tablet Take 1 tablet (325 mg total) by mouth every other day. 30 tablet 2    Prenat-Fe Poly-Methfol-FA-DHA (VITAFOL FE+) 90-0.6-0.4-200 MG CAPS Take 1 capsule by mouth at bedtime. (Patient not taking: Reported on 09/09/2019)      Prenat-FePoly-Metf-FA-DHA-DSS (VITAFOL FE+) 90-1-200 & 50 MG CPPK Take 1 tablet by mouth daily. (Patient not taking: Reported on 08/17/2019) 60 each 3     I have reviewed patient's Past Medical Hx, Surgical Hx, Family Hx, Social Hx, medications and allergies.   ROS:  Review of Systems  Constitutional: Negative for fever.  Gastrointestinal: Positive for abdominal pain. Negative for anorexia, constipation, diarrhea, nausea and vomiting.  Genitourinary: Positive for frequency. Negative for dysuria.  Musculoskeletal: Negative for myalgias.   Other systems negative  Physical Exam  No data found. Constitutional: Well-developed, well-nourished female (+weight gain), in significant distress from pain, screaming and writhing.  Cardiovascular: normal rate and rhythm Respiratory: normal effort, clear to  auscultation bilaterally GI: Abd soft, diffusely tender, gravid appropriate for gestational age.   No rebound or guarding. MS: Extremities nontender, no edema, normal ROM Neurologic: Alert and oriented x 4.  GU: Neg CVAT.  PELVIC EXAM: Fetal fibronectin and fern slide obtained                           Perineum is dry  Dilation: Closed Effacement (%): 30 Exam by:: Maite Burlison,CNM  FHT:  Baseline 135 , moderate variability, accelerations present, no decelerations Contractions: Uterine irritability, each cramp is 10 seconds long, no contractions longer than  that   Labs: Results for orders placed or performed during the hospital encounter of 09/15/19 (from the past 24 hour(s))  Urinalysis, Routine w reflex microscopic Urine, Clean Catch     Status: None   Collection Time: 09/15/19  9:15 PM  Result Value Ref Range   Color, Urine YELLOW YELLOW   APPearance CLEAR CLEAR   Specific Gravity, Urine 1.024 1.005 - 1.030   pH 7.0 5.0 - 8.0   Glucose, UA NEGATIVE NEGATIVE mg/dL   Hgb urine dipstick NEGATIVE NEGATIVE   Bilirubin Urine NEGATIVE NEGATIVE   Ketones, ur NEGATIVE NEGATIVE mg/dL   Protein, ur NEGATIVE NEGATIVE mg/dL   Nitrite NEGATIVE NEGATIVE   Leukocytes,Ua NEGATIVE NEGATIVE  Fetal fibronectin     Status: None   Collection Time: 09/15/19 10:20 PM  Result Value Ref Range   Fetal Fibronectin NEGATIVE NEGATIVE  CBC     Status: Abnormal   Collection Time: 09/15/19 11:32 PM  Result Value Ref Range   WBC 14.1 (H) 4.0 - 10.5 K/uL   RBC 3.75 (L) 3.87 - 5.11 MIL/uL   Hemoglobin 10.7 (L) 12.0 - 15.0 g/dL   HCT 33.1 (L) 36 - 46 %   MCV 88.3 80.0 - 100.0 fL   MCH 28.5 26.0 - 34.0 pg   MCHC 32.3 30.0 - 36.0 g/dL   RDW 13.1 11.5 - 15.5 %   Platelets 372 150 - 400 K/uL   nRBC 0.0 0.0 - 0.2 %  Comprehensive metabolic panel     Status: Abnormal   Collection Time: 09/15/19 11:32 PM  Result Value Ref Range   Sodium 136 135 - 145 mmol/L   Potassium 3.5 3.5 - 5.1 mmol/L   Chloride 106 98  - 111 mmol/L   CO2 19 (L) 22 - 32 mmol/L   Glucose, Bld 115 (H) 70 - 99 mg/dL   BUN 11 6 - 20 mg/dL   Creatinine, Ser 0.62 0.44 - 1.00 mg/dL   Calcium 9.0 8.9 - 10.3 mg/dL   Total Protein 6.4 (L) 6.5 - 8.1 g/dL   Albumin 2.6 (L) 3.5 - 5.0 g/dL   AST 13 (L) 15 - 41 U/L   ALT 13 0 - 44 U/L   Alkaline Phosphatase 169 (H) 38 - 126 U/L   Total Bilirubin 0.4 0.3 - 1.2 mg/dL   GFR calc non Af Amer >60 >60 mL/min   GFR calc Af Amer >60 >60 mL/min   Anion gap 11 5 - 15    O/Positive/-- (05/14 1059)  Imaging:    MAU Course/MDM: I have ordered labs and reviewed results. UA is clear and dilute. Will check CBC and CMET to rule out any abdominal processes > normal.  Doubt appendicitis due to patient's appetite and lack of pain now.  Marland Kitchen  FFn is negative.   Negative fern. NST reviewed, reactive.  Treatments in MAU included Procardia series, which stopped irritability.  States pain level is "0". Ate her meal and requested more.  No vomiting afterward. .    Assessment: Single IUP at 63w2dUterine irritability with negative Fetal Fibronectin  Vaginal discharge, no evidence of ruptured membranes Food insecurity, unclear as to reason  Plan: Discharge home Preterm Labor precautions and fetal kick counts Follow up in Office for prenatal visits  Message sent to office to inquire about social work consult (MFM note states they referred her to their patient resources coordinator who did arrange for transportation)   At discharge patient states she eats twice a day, while at  initial HPI she stated she had eaten very little in the past 2 weeks.   Encouraged to return here or to other Urgent Care/ED if she develops worsening of symptoms, increase in pain, fever, or other concerning symptoms.   Pt stable at time of discharge.  Hansel Feinstein CNM, MSN Certified Nurse-Midwife 09/15/2019 10:10 PM

## 2019-09-15 NOTE — Addendum Note (Signed)
Addended by: Sharen Counter A on: 09/15/2019 12:45 PM   Modules accepted: Orders

## 2019-09-15 NOTE — MAU Note (Signed)
Pt reports severe abd pain that is constant x 2 hours, pt crying. Denies bleeding but reports ?leaking fluid.

## 2019-09-16 DIAGNOSIS — O26893 Other specified pregnancy related conditions, third trimester: Secondary | ICD-10-CM | POA: Diagnosis not present

## 2019-09-16 LAB — COMPREHENSIVE METABOLIC PANEL
ALT: 13 U/L (ref 0–44)
AST: 13 U/L — ABNORMAL LOW (ref 15–41)
Albumin: 2.6 g/dL — ABNORMAL LOW (ref 3.5–5.0)
Alkaline Phosphatase: 169 U/L — ABNORMAL HIGH (ref 38–126)
Anion gap: 11 (ref 5–15)
BUN: 11 mg/dL (ref 6–20)
CO2: 19 mmol/L — ABNORMAL LOW (ref 22–32)
Calcium: 9 mg/dL (ref 8.9–10.3)
Chloride: 106 mmol/L (ref 98–111)
Creatinine, Ser: 0.62 mg/dL (ref 0.44–1.00)
GFR calc Af Amer: >60 mL/min (ref >60)
GFR calc non Af Amer: >60 mL/min (ref >60)
Glucose, Bld: 115 mg/dL — ABNORMAL HIGH (ref 70–99)
Potassium: 3.5 mmol/L (ref 3.5–5.1)
Sodium: 136 mmol/L (ref 135–145)
Total Bilirubin: 0.4 mg/dL (ref 0.3–1.2)
Total Protein: 6.4 g/dL — ABNORMAL LOW (ref 6.5–8.1)

## 2019-09-16 NOTE — Discharge Instructions (Signed)
Fetal Fibronectin Test  (Your Test was NEGATIVE) Why am I having this test? Fetal fibronectin (fFN) is a protein that your body makes during pregnancy. Having fFN in your vaginal fluid between 22 and 36 weeks of pregnancy could be a warning sign that your baby will be born early (prematurely). Babies born prematurely, or before 37 weeks, may have trouble breathing or feeding. You may have this test if you have symptoms of premature labor, such as:  Contractions.  More vaginal discharge.  Backache. What is being tested? This test checks the level of fFN in your vaginal fluid. What kind of sample is taken?  This test requires a sample of fluid from inside your vagina. This sample is collected by your health care provider using a cotton swab. How do I prepare for this test?  For 24 hours before the test, do not have sex or put anything into your vagina. Tell a health care provider about:  Any allergies you have.  Any medical conditions you have, especially any vaginal yeast infections or any symptoms of a yeast infection, including: ? Itchiness. ? Soreness. ? Unusual discharge. How are the results reported? Your results will be reported as positive or negative for fFN. If positive, results may also be given as micrograms of fFN per milliliter of vaginal fluid (mcg/mL). A result of 0.05 mcg/mL or less is considered negative. A false-positive result can occur. A false positive is incorrect because it means that a condition is present when it is not. A number of factors may lead to false-positive results, including vaginal bleeding, recent sexual intercourse, or a recent cervical exam. Your health care provider will talk to you about doing more tests to confirm your results. What do the results mean? A negative result means that no fFN was found in your vaginal fluid, meaning that premature delivery during the next 2 weeks is very unlikely. If you are still having symptoms of early labor, you  may need to have this test again in two weeks. A positive result means that fFN was found in your vaginal fluid. This means that your risk for premature labor is greater, but it does not mean that you will go into early labor. Your health care provider may do other tests and exams to closely monitor your pregnancy. Talk with your health care provider about what your results mean. Questions to ask your health care provider Ask your health care provider, or the department that is doing the test:  When will my results be ready?  How will I get my results?  What are my treatment options?  What other tests do I need?  What are my next steps? Summary  Fetal fibronectin (fFN) is a protein that your body makes during pregnancy.  Having fFN in your vaginal fluid between 22 and 36 weeks of pregnancy could be a warning sign that your baby will be born early (prematurely).  A negative result means that no fFN was found in your vaginal fluid, meaning that premature delivery over the next 2 weeks is very unlikely. This information is not intended to replace advice given to you by your health care provider. Make sure you discuss any questions you have with your health care provider. Document Revised: 08/09/2016 Document Reviewed: 08/09/2016 Elsevier Patient Education  2020 [employer] Inc. Abdominal Pain During Pregnancy  Abdominal pain is common during pregnancy, and has many possible causes. Some causes are more serious than others, and sometimes the cause is not known. Abdominal pain  can be a sign that labor is starting. It can also be caused by normal growth and stretching of muscles and ligaments during pregnancy. Always tell your health care provider if you have any abdominal pain. Follow these instructions at home:  Do not have sex or put anything in your vagina until your pain goes away completely.  Get plenty of rest until your pain improves.  Drink enough fluid to keep your urine pale  yellow.  Take over-the-counter and prescription medicines only as told by your health care provider.  Keep all follow-up visits as told by your health care provider. This is important. Contact a health care provider if:  Your pain continues or gets worse after resting.  You have lower abdominal pain that: ? Comes and goes at regular intervals. ? Spreads to your back. ? Is similar to menstrual cramps.  You have pain or burning when you urinate. Get help right away if:  You have a fever or chills.  You have vaginal bleeding.  You are leaking fluid from your vagina.  You are passing tissue from your vagina.  You have vomiting or diarrhea that lasts for more than 24 hours.  Your baby is moving less than usual.  You feel very weak or faint.  You have shortness of breath.  You develop severe pain in your upper abdomen. Summary  Abdominal pain is common during pregnancy, and has many possible causes.  If you experience abdominal pain during pregnancy, tell your health care provider right away.  Follow your health care provider's home care instructions and keep all follow-up visits as directed. This information is not intended to replace advice given to you by your health care provider. Make sure you discuss any questions you have with your health care provider. Document Revised: 04/07/2018 Document Reviewed: 03/22/2016 Elsevier Patient Education  2020 [employer].

## 2019-09-17 ENCOUNTER — Telehealth: Payer: Self-pay

## 2019-09-17 NOTE — Telephone Encounter (Signed)
Received call from patient- she reports being in so much pain over the last several days. She was at [facility] however reports nothing was done. She reports not being able to sleep or eat due to the lower back and stomach pain. I have instructed her to go back to ER to be check out again and she can also take something for pain. Patient verbalize understanding at this time.

## 2019-09-21 ENCOUNTER — Ambulatory Visit (INDEPENDENT_AMBULATORY_CARE_PROVIDER_SITE_OTHER): Payer: Medicaid Other | Admitting: Licensed Clinical Social Worker

## 2019-09-21 DIAGNOSIS — Z608 Other problems related to social environment: Secondary | ICD-10-CM

## 2019-09-21 DIAGNOSIS — Z139 Encounter for screening, unspecified: Secondary | ICD-10-CM

## 2019-09-25 NOTE — BH Specialist Note (Signed)
Integrated Behavioral Health via Telemedicine Video (Caregility) Visit  09/25/2019 Robyn Cummings [id number]  Number of Integrated Behavioral Health visits: 1 Session Start time: 1:11pm  Session End time: 1:36pm Total time: 25 minutes via phone due to pt phone having bad internet connection to complete visit via mychart   Referring Provider: M. Willams CNM  Type of Service: Individual Patient/Family location: [facility] Provider location: [facility] All persons participating in visit: Pt. Robyn Cummings and LCSWA A. Felton Clinton    I connected with Robyn Cummings and/or [name] n/a by a video enabled telemedicine application (Caregility) and verified that I am speaking with the correct person using two identifiers.   Discussed confidentiality: yes  Confirmed demographics & insurance:  yes  I discussed that engaging in this virtual visit, they consent to the provision of behavioral healthcare and the services will be billed under their insurance.   Patient and/or legal guardian expressed understanding and consented to virtual visit: yes  PRESENTING CONCERNS: Patient and/or family reports the following concerns: social determinant such transportation, food insecurity and safe housing issues  Duration of problem: one year; Severity of problem: mild  STRENGTHS (Protective Factors/Coping Skills): Robyn Cummings is employed and currently receives nutrition and medical benefits through [facility] DSS   ASSESSMENT: Patient currently experiencing social determinants. Robyn Cummings reports she received nutrition benefits from [facility] DSS and Public Health however she does not  Have transportation to get groceries. LCSWA A. Felton Clinton inform pt [employer] accepts EBT and will deliver groceries to her location. Robyn Cummings reports her apartment has mold and she has contacted [employer] on [address] to address the issue. LCSWA A. Felton Clinton informed Pt to contact  legal aid of [place] for assistance regarding safe housing and explore her options.    GOALS ADDRESSED: Patient will: 1.  Reduce symptoms of: n/a  2.  Increase knowledge and/or ability of: n/a  3.  Demonstrate ability to: n/a   Progress of Goals: According to Robyn Cummings, she is eager to get her apartment safe for her newborn and will contact Legal aid and housing authority for further assistance.   INTERVENTIONS: Interventions utilized:  Supportive Counseling Standardized Assessments completed & reviewed: n/a      PLAN: 1. Follow up with behavioral health clinician on : prn 2. Behavioral recommendations: n/a 3. Referral(s): legal aid of [place] to consult regarding unsafe housing concerns,   I discussed the assessment and treatment plan with the patient and/or parent/guardian. They were provided an opportunity to ask questions and all were answered. They agreed with the plan and demonstrated an understanding of the instructions.   They were advised to call back or seek an in-person evaluation as appropriate.  I discussed that the purpose of this visit is to provide behavioral health care while limiting exposure to the novel coronavirus.  Discussed there is a possibility of technology failure and discussed alternative modes of communication if that failure occurs.  [name]

## 2019-10-02 ENCOUNTER — Other Ambulatory Visit: Payer: Self-pay

## 2019-10-02 ENCOUNTER — Other Ambulatory Visit: Payer: Self-pay | Admitting: *Deleted

## 2019-10-02 ENCOUNTER — Ambulatory Visit: Payer: Medicaid Other | Attending: Maternal & Fetal Medicine

## 2019-10-02 ENCOUNTER — Ambulatory Visit: Payer: Medicaid Other | Admitting: *Deleted

## 2019-10-02 ENCOUNTER — Encounter: Payer: Self-pay | Admitting: *Deleted

## 2019-10-02 DIAGNOSIS — O99213 Obesity complicating pregnancy, third trimester: Secondary | ICD-10-CM

## 2019-10-02 DIAGNOSIS — Z362 Encounter for other antenatal screening follow-up: Secondary | ICD-10-CM | POA: Diagnosis not present

## 2019-10-02 DIAGNOSIS — O409XX0 Polyhydramnios, unspecified trimester, not applicable or unspecified: Secondary | ICD-10-CM

## 2019-10-02 DIAGNOSIS — O43199 Other malformation of placenta, unspecified trimester: Secondary | ICD-10-CM | POA: Diagnosis present

## 2019-10-02 DIAGNOSIS — Z3A31 31 weeks gestation of pregnancy: Secondary | ICD-10-CM | POA: Diagnosis not present

## 2019-10-02 DIAGNOSIS — O43193 Other malformation of placenta, third trimester: Secondary | ICD-10-CM

## 2019-10-08 ENCOUNTER — Encounter: Payer: Self-pay | Admitting: Certified Nurse Midwife

## 2019-10-08 ENCOUNTER — Ambulatory Visit (INDEPENDENT_AMBULATORY_CARE_PROVIDER_SITE_OTHER): Payer: Medicaid Other | Admitting: Certified Nurse Midwife

## 2019-10-08 ENCOUNTER — Other Ambulatory Visit: Payer: Self-pay

## 2019-10-08 VITALS — BP 107/82 | HR 97 | Wt 218.0 lb

## 2019-10-08 DIAGNOSIS — R102 Pelvic and perineal pain: Secondary | ICD-10-CM

## 2019-10-08 DIAGNOSIS — Z3A32 32 weeks gestation of pregnancy: Secondary | ICD-10-CM

## 2019-10-08 DIAGNOSIS — O409XX0 Polyhydramnios, unspecified trimester, not applicable or unspecified: Secondary | ICD-10-CM

## 2019-10-08 DIAGNOSIS — O43193 Other malformation of placenta, third trimester: Secondary | ICD-10-CM

## 2019-10-08 DIAGNOSIS — O26893 Other specified pregnancy related conditions, third trimester: Secondary | ICD-10-CM

## 2019-10-08 DIAGNOSIS — O0990 Supervision of high risk pregnancy, unspecified, unspecified trimester: Secondary | ICD-10-CM

## 2019-10-08 MED ORDER — COMFORT FIT MATERNITY SUPP LG MISC: 1.0000 [IU] | Freq: Every day | 0 refills | Status: DC

## 2019-10-08 NOTE — Progress Notes (Signed)
Pt is having increase in vaginal pain with movement.  Pt is also having right side hip pain. Pt states she will have some "tightening in her belly daily that comes and goes". Pt completed water birth class and has certificate.

## 2019-10-08 NOTE — Progress Notes (Signed)
   PRENATAL VISIT NOTE  Subjective:  Robyn Cummings is a 24 y.o. G4P0030 at [redacted]w[redacted]d being seen today for ongoing prenatal care.  She is currently monitored for the following issues for this low-risk pregnancy and has Posttraumatic amnesia; Cannabis abuse; Supervision of high risk pregnancy, antepartum; Trichomonas infection; Polyhydramnios affecting pregnancy; and Marginal insertion of umbilical cord affecting management of mother in third trimester on their problem list.  Patient reports pelvic pressure and back pain.  Contractions: Irritability. Vag. Bleeding: None.  Movement: Present. Denies leaking of fluid.   The following portions of the patient's history were reviewed and updated as appropriate: allergies, current medications, past family history, past medical history, past social history, past surgical history and problem list.   Objective:   Vitals:   10/08/19 1310  BP: 107/82  Pulse: 97  Weight: 218 lb (98.9 kg)    Fetal Status: Fetal Heart Rate (bpm): 140 Fundal Height: 36 cm Movement: Present     General:  Alert, oriented and cooperative. Patient is in no acute distress.  Skin: Skin is warm and dry. No rash noted.   Cardiovascular: Normal heart rate noted  Respiratory: Normal respiratory effort, no problems with respiration noted  Abdomen: Soft, gravid, appropriate for gestational age.  Pain/Pressure: Present     Pelvic: Cervical exam deferred        Extremities: Normal range of motion.  Edema: Trace  Mental Status: Normal mood and affect. Normal behavior. Normal judgment and thought content.   Assessment and Plan:  Pregnancy: G4P0030 at [redacted]w[redacted]d 1. Supervision of high risk pregnancy, antepartum - Patient doing well, reports pelvic pressure and back pain  - Patient has multiple concerns as it relates to finances and food. Patient reports that she has been unable to get car seat and application for food stamps is being slow per patient. Discussed with patient pregnancy care  manager that will be able to assist. Patient is agreeable to that.  - Patient reports that she wants a waterbirth, completed class recently- Patient sent class certificate through [employer].  - Patient reports that she does not have support with this pregnancy and will not have it during labor. Educated and discussed doula services with patient- discussed having volunteer doula services and information given to patient to contact  - Routine prenatal care - Anticipatory guidance on upcoming appointments   2. Polyhydramnios affecting pregnancy - mild polyhydramnios - AFI sum 25.62 - monitored by MFM   3. Marginal insertion of umbilical cord affecting management of mother in third trimester - Follow up fetal growth with MFM scheduled 11/06/19  4. [redacted] weeks gestation of pregnancy - Patient reports occasional pelvic pressure and back pain, especially when she is at work  - educated and discussed use of maternity support belt  - Rx for maternity support belt faxed   Preterm labor symptoms and general obstetric precautions including but not limited to vaginal bleeding, contractions, leaking of fluid and fetal movement were reviewed in detail with the patient. Please refer to After Visit Summary for other counseling recommendations.   Return in about 2 weeks (around 10/22/2019) for LROB.  Future Appointments  Date Time Provider Department Center  10/21/2019  2:00 PM Raelyn Mora, CNM [facility] None  11/06/2019  9:30 AM [facility] NURSE [facility]  11/06/2019  9:45 AM [facility] US5 [facility]    Sharyon Cable, CNM

## 2019-10-08 NOTE — Patient Instructions (Signed)

## 2019-10-09 ENCOUNTER — Inpatient Hospital Stay (HOSPITAL_COMMUNITY)
Admission: AD | Admit: 2019-10-09 | Discharge: 2019-10-09 | Disposition: A | Discharge status: Home / Self Care | Payer: Medicaid Other | Attending: Obstetrics and Gynecology | Admitting: Obstetrics and Gynecology

## 2019-10-09 ENCOUNTER — Encounter (HOSPITAL_COMMUNITY): Payer: Self-pay | Admitting: Obstetrics and Gynecology

## 2019-10-09 ENCOUNTER — Other Ambulatory Visit: Payer: Self-pay

## 2019-10-09 DIAGNOSIS — O26893 Other specified pregnancy related conditions, third trimester: Secondary | ICD-10-CM | POA: Diagnosis not present

## 2019-10-09 DIAGNOSIS — Z3689 Encounter for other specified antenatal screening: Secondary | ICD-10-CM

## 2019-10-09 DIAGNOSIS — O99323 Drug use complicating pregnancy, third trimester: Secondary | ICD-10-CM | POA: Insufficient documentation

## 2019-10-09 DIAGNOSIS — F17210 Nicotine dependence, cigarettes, uncomplicated: Secondary | ICD-10-CM | POA: Insufficient documentation

## 2019-10-09 DIAGNOSIS — Y92002 Bathroom of unspecified non-institutional (private) residence single-family (private) house as the place of occurrence of the external cause: Secondary | ICD-10-CM | POA: Insufficient documentation

## 2019-10-09 DIAGNOSIS — Y93E1 Activity, personal bathing and showering: Secondary | ICD-10-CM | POA: Insufficient documentation

## 2019-10-09 DIAGNOSIS — O403XX0 Polyhydramnios, third trimester, not applicable or unspecified: Secondary | ICD-10-CM | POA: Diagnosis not present

## 2019-10-09 DIAGNOSIS — F1290 Cannabis use, unspecified, uncomplicated: Secondary | ICD-10-CM | POA: Insufficient documentation

## 2019-10-09 DIAGNOSIS — O43123 Velamentous insertion of umbilical cord, third trimester: Secondary | ICD-10-CM | POA: Insufficient documentation

## 2019-10-09 DIAGNOSIS — W01198A Fall on same level from slipping, tripping and stumbling with subsequent striking against other object, initial encounter: Secondary | ICD-10-CM | POA: Diagnosis not present

## 2019-10-09 DIAGNOSIS — M25551 Pain in right hip: Secondary | ICD-10-CM | POA: Insufficient documentation

## 2019-10-09 DIAGNOSIS — O9A213 Injury, poisoning and certain other consequences of external causes complicating pregnancy, third trimester: Secondary | ICD-10-CM | POA: Insufficient documentation

## 2019-10-09 DIAGNOSIS — O99333 Smoking (tobacco) complicating pregnancy, third trimester: Secondary | ICD-10-CM | POA: Insufficient documentation

## 2019-10-09 DIAGNOSIS — Z3A32 32 weeks gestation of pregnancy: Secondary | ICD-10-CM | POA: Diagnosis not present

## 2019-10-09 DIAGNOSIS — W19XXXA Unspecified fall, initial encounter: Secondary | ICD-10-CM | POA: Insufficient documentation

## 2019-10-09 MED ORDER — CYCLOBENZAPRINE HCL 10 MG PO TABS
10.0000 mg | ORAL_TABLET | Freq: Three times a day (TID) | ORAL | 1 refills | Status: DC | PRN
Start: 2019-10-09 — End: 2019-11-26

## 2019-10-09 MED ORDER — MAG-OXIDE 200 MG PO TABS
400.0000 mg | ORAL_TABLET | Freq: Every day | ORAL | 3 refills | Status: DC
Start: 2019-10-09 — End: 2019-11-26

## 2019-10-09 MED ORDER — CYCLOBENZAPRINE HCL 5 MG PO TABS
5.0000 mg | ORAL_TABLET | Freq: Once | ORAL | Status: AC
  Administered 2019-10-09: 5 mg via ORAL
  Filled 2019-10-09: qty 1

## 2019-10-09 NOTE — Discharge Instructions (Signed)
Hip Pain The hip is the joint between the upper legs and the lower pelvis. The bones, cartilage, tendons, and muscles of your hip joint support your body and allow you to move around. Hip pain can range from a minor ache to severe pain in one or both of your hips. The pain may be felt on the inside of the hip joint near the groin, or on the outside near the buttocks and upper thigh. You may also have swelling or stiffness in your hip area. Follow these instructions at home: Managing pain, stiffness, and swelling      If directed, put ice on the painful area. To do this: ? Put ice in a plastic bag. ? Place a towel between your skin and the bag. ? Leave the ice on for 20 minutes, 2-3 times a day.  If directed, apply heat to the affected area as often as told by your health care provider. Use the heat source that your health care provider recommends, such as a moist heat pack or a heating pad. ? Place a towel between your skin and the heat source. ? Leave the heat on for 20-30 minutes. ? Remove the heat if your skin turns bright red. This is especially important if you are unable to feel pain, heat, or cold. You may have a greater risk of getting burned. Activity  Do exercises as told by your health care provider.  Avoid activities that cause pain. General instructions   Take over-the-counter and prescription medicines only as told by your health care provider.  Keep a journal of your symptoms. Write down: ? How often you have hip pain. ? The location of your pain. ? What the pain feels like. ? What makes the pain worse.  Sleep with a pillow between your legs on your most comfortable side.  Keep all follow-up visits as told by your health care provider. This is important. Contact a health care provider if:  You cannot put weight on your leg.  Your pain or swelling continues or gets worse after one week.  It gets harder to walk.  You have a fever. Get help right away  if:  You fall.  You have a sudden increase in pain and swelling in your hip.  Your hip is red or swollen or very tender to touch. Summary  Hip pain can range from a minor ache to severe pain in one or both of your hips.  The pain may be felt on the inside of the hip joint near the groin, or on the outside near the buttocks and upper thigh.  Avoid activities that cause pain.  Write down how often you have hip pain, the location of the pain, what makes it worse, and what it feels like. This information is not intended to replace advice given to you by your health care provider. Make sure you discuss any questions you have with your health care provider. Document Revised: 05/05/2018 Document Reviewed: 05/05/2018 Elsevier Patient Education  2020 Elsevier Inc. -- 

## 2019-10-09 NOTE — MAU Note (Signed)
Pt. Reporting to MAU stating she fell about 1000 this morning getting out of the bathtub.  Patient reports hitting R side of abdomen.  Denies vag. Bleeding.  Rates pain 9/10. + Fetal movement.

## 2019-10-09 NOTE — MAU Provider Note (Signed)
Chief Complaint:  Fall and Hip Pain   First Provider Initiated Contact with Patient 10/09/19 1248     HPI: Robyn Cummings is a 24 y.o. G4P0030 at 83w4dwho presents to maternity admissions reporting a fall today. She was getting out of the tub and slipped, she twisted to avoid falling directly on her belly so hit the side of her hip on the toilet paper dispenser. She is having hip pain she rates as sharp and 9/10, but has also had this ongoing hip pain when walking or standing for too long. She works 10hr shifts on her feet and is always in pain by the end of the day. Wants to know if something is wrong with her hip. Denies vaginal bleeding, leaking of fluid, decreased fetal movement, fever, falls, or recent illness.   Pregnancy Course: Marginal cord insertion, polyhydramnios, close BP monitoring  Past Medical History:  Diagnosis Date  . ADHD (attention deficit hyperactivity disorder)   . Anaphylactic reaction   . Anemia   . Anxiety   . Bipolar 1 disorder ([place]   . Chronic back pain   . Dyspnea   . Migraine   . [place]iz[place]    has not had a seizure since childhood, no medications currently  . Sleep disorder   . Vaginal Pap smear, abnormal    OB History  Gravida Para Term Preterm AB Living  4 0 0 0 3 0  SAB TAB Ectopic Multiple Live Births  1 0 0        # Outcome Date GA Lbr Len/2nd Weight Sex Delivery Anes PTL Lv  4 Current           3 SAB           2 AB           1 AB            History reviewed. No pertinent surgical history. Family History  Problem Relation Age of Onset  . Anxiety disorder Mother   . Arthritis Father   . Asthma Father   . Cancer Maternal Aunt   . Diabetes Maternal Uncle   . Cancer Paternal Uncle   . Diabetes Paternal Grandmother   . Stroke Paternal Grandmother    Social History   Tobacco Use  . Smoking status: Current Some Day Smoker    Packs/day: 0.25    Types: Cigarettes  . Smokeless tobacco: Never Used  Vaping Use  . Vaping Use: Never  used  Substance Use Topics  . Alcohol use: No  . Drug use: Not Currently    Types: Marijuana    Comment: last smoked 3 weeks ago   Allergies  Allergen Reactions  . Mushroom Extract Complex Anaphylaxis    Anything with mushrooms  . Lime [Lime, Sulfurated] Other (See Comments)    Skin rash   . Spinach Nausea And Vomiting    "can't have cooked spinach"   Medications Prior to Admission  Medication Sig Dispense Refill Last Dose  . ferrous sulfate (FERROUSUL) 325 (65 FE) MG tablet Take 1 tablet (325 mg total) by mouth every other day. 30 tablet 2 10/08/2019 at Unknown time  . albuterol (VENTOLIN HFA) 108 (90 Base) MCG/ACT inhaler Inhale 1-2 puffs into the lungs every 6 (six) hours as needed for wheezing or shortness of breath. (Patient not taking: Reported on 05/08/2019) 6.7 g 0   . benzonatate (TESSALON) 100 MG capsule Take 1 capsule (100 mg total) by mouth 3 (three) times daily as  needed for cough. (Patient not taking: Reported on 05/08/2019) 21 capsule 0   . Blood Pressure Monitoring (BLOOD PRESSURE KIT) DEVI 1 kit by Does not apply route once a week. Check Blood Pressure regularly and record readings into the Babyscripts App.  Large Cuff.  DX O90.0 (Patient not taking: Reported on 05/08/2019) 1 each 0   . Elastic Bandages & Supports (COMFORT FIT MATERNITY SUPP LG) MISC 1 Units by Does not apply route daily. 1 each 0   . Prenat-Fe Poly-Methfol-FA-DHA (VITAFOL FE+) 90-0.6-0.4-200 MG CAPS Take 1 capsule by mouth at bedtime. (Patient not taking: Reported on 09/09/2019)     . Prenat-FePoly-Metf-FA-DHA-DSS (VITAFOL FE+) 90-1-200 & 50 MG CPPK Take 1 tablet by mouth daily. (Patient not taking: Reported on 08/17/2019) 60 each 3     I have reviewed patient's Past Medical Hx, Surgical Hx, Family Hx, Social Hx, medications and allergies.   ROS:  Review of Systems  Cardiovascular: Negative for leg swelling.  Gastrointestinal: Negative for nausea and vomiting.  Musculoskeletal: Positive for arthralgias (pain  in her hip and lower back, new in pregnancy).  Neurological: Negative for dizziness, syncope, weakness and light-headedness.  All other systems reviewed and are negative.   Physical Exam   Patient Vitals for the past 24 hrs:  BP Temp Temp src Pulse Resp SpO2 Height Weight  10/09/19 1245 -- -- -- -- -- 100 % -- --  10/09/19 1145 -- -- -- -- -- 99 % -- --  10/09/19 1132 132/78 98.3 F (36.8 C) Oral 90 18 99 % 5' 4"  (1.626 m) 218 lb (98.9 kg)    Constitutional: Well-developed, well-nourished female in no acute distress, appears tired. Cardiovascular: normal rate & rhythm, no murmur Respiratory: normal effort, lung sounds clear throughout GI: Abd soft, non-tender, gravid appropriate for gestational age. Pos BS x 4 MS: Extremities nontender, no edema or bruising on hip, normal ROM Neurologic: Alert and oriented x 4.  Pelvic exam deferred  Fetal Tracing: reactive Baseline: 130 Variability: moderate Accelerations: present Decelerations: none Toco: relaxed  MAU Course: Orders Placed This Encounter  Procedures  . Discharge patient   Meds ordered this encounter  Medications  . cyclobenzaprine (FLEXERIL) tablet 5 mg  . cyclobenzaprine (FLEXERIL) 10 MG tablet    Sig: Take 1 tablet (10 mg total) by mouth every 8 (eight) hours as needed for muscle spasms.    Dispense:  30 tablet    Refill:  1    Order Specific Question:   Supervising Provider    Answer:   Donnamae Jude [[id number]]  . Magnesium Oxide (MAG-OXIDE) 200 MG TABS    Sig: Take 2 tablets (400 mg total) by mouth at bedtime. If that amount causes loose stools in the am, switch to 242m daily at bedtime.    Dispense:  60 tablet    Refill:  3    Order Specific Question:   Supervising Provider    Answer:   PMerrily Pew   MDM: No direct fall on belly, NST reactive will monitor for an hour Discussed ways to manage pain associated with pregnancy - pt has prescription for pregnancy belt. Will send prescriptions for  magnesium and flexeril to be taken at bedtime  Recommended chiropractic care, can see Dr. SNehemiah Settleif she cannot get in with a community chiro  Assessment: 1. Fall, initial encounter   2. NST (non-stress test) reactive   3. Right hip pain     Plan: Discharge home in stable condition with preterm  labor precautions.  Pregnancy belt prescription faxed to [place] yesterday by Darrol Poke, CNM Suggested imaging be done outpatient and reviewed in her regular OB visits if needed, but emphasized normal discomforts of pregnancy are best managed as described above.   Follow-up Information    [facility]. Go on 10/21/2019.   Specialty: Obstetrics and Gynecology Why: Prenatal appointment is at 2pm with Laury Deep, CNM Contact information: [address], [place] [phone]              Allergies as of 10/09/2019      Reactions   Mushroom Extract Complex Anaphylaxis   Anything with mushrooms   Lime [lime, Sulfurated] Other (See Comments)   Skin rash    Spinach Nausea And Vomiting   "can't have cooked spinach"      Medication List    STOP taking these medications   benzonatate 100 MG capsule Commonly known as: TESSALON   Vitafol FE+ 90-1-200 & 50 MG Cppk     TAKE these medications   albuterol 108 (90 Base) MCG/ACT inhaler Commonly known as: VENTOLIN HFA Inhale 1-2 puffs into the lungs every 6 (six) hours as needed for wheezing or shortness of breath.   Blood Pressure Kit Devi 1 kit by Does not apply route once a week. Check Blood Pressure regularly and record readings into the Babyscripts App.  Large Cuff.  DX O90.0   Comfort Fit Maternity Supp Lg Misc 1 Units by Does not apply route daily.   cyclobenzaprine 10 MG tablet Commonly known as: FLEXERIL Take 1 tablet (10 mg total) by mouth every 8 (eight) hours as needed for muscle spasms.   ferrous sulfate 325 (65 FE) MG  tablet Commonly known as: FerrouSul Take 1 tablet (325 mg total) by mouth every other day.   Mag-Oxide 200 MG Tabs Generic drug: Magnesium Oxide Take 2 tablets (400 mg total) by mouth at bedtime. If that amount causes loose stools in the am, switch to 272m daily at bedtime.   Vitafol FE+ 90-0.6-0.4-200 MG Caps Take 1 capsule by mouth at bedtime.       JGaylan Gerold CNM, MSN, [facility]10/08/21 3:12 PM

## 2019-10-21 ENCOUNTER — Other Ambulatory Visit: Payer: Self-pay

## 2019-10-21 ENCOUNTER — Ambulatory Visit (INDEPENDENT_AMBULATORY_CARE_PROVIDER_SITE_OTHER): Payer: Medicaid Other | Admitting: Obstetrics and Gynecology

## 2019-10-21 ENCOUNTER — Encounter: Payer: Self-pay | Admitting: Obstetrics and Gynecology

## 2019-10-21 VITALS — BP 115/77 | HR 104 | Wt 219.4 lb

## 2019-10-21 DIAGNOSIS — Z3A34 34 weeks gestation of pregnancy: Secondary | ICD-10-CM

## 2019-10-21 DIAGNOSIS — O43193 Other malformation of placenta, third trimester: Secondary | ICD-10-CM

## 2019-10-21 DIAGNOSIS — O409XX0 Polyhydramnios, unspecified trimester, not applicable or unspecified: Secondary | ICD-10-CM

## 2019-10-21 DIAGNOSIS — O0990 Supervision of high risk pregnancy, unspecified, unspecified trimester: Secondary | ICD-10-CM

## 2019-10-21 NOTE — Progress Notes (Signed)
Pt. Is here for ROB, [redacted]w[redacted]d.

## 2019-10-21 NOTE — Progress Notes (Signed)
   LOW-RISK PREGNANCY OFFICE VISIT Patient name: Robyn Cummings MRN [id number]  Date of birth: 01/28/1995 Chief Complaint:   Routine Prenatal Visit  History of Present Illness:   Robyn Cummings is a 24 y.o. G72P0030 female at [redacted]w[redacted]d with an Estimated Date of Delivery: 11/30/19 being seen today for ongoing management of a low-risk pregnancy.  Today she reports no complaints. Planning a waterbirth. Class and Certificate done. Contractions: Irritability. Vag. Bleeding: None.  Movement: Present. denies leaking of fluid. Review of Systems:   Pertinent items are noted in HPI Denies abnormal vaginal discharge w/ itching/odor/irritation, headaches, visual changes, shortness of breath, chest pain, abdominal pain, severe nausea/vomiting, or problems with urination or bowel movements unless otherwise stated above. Pertinent History Reviewed:  Reviewed past medical,surgical, social, obstetrical and family history.  Reviewed problem list, medications and allergies. Physical Assessment:   Vitals:   10/21/19 1333  BP: 115/77  Pulse: (!) 104  Weight: 219 lb 6.4 oz (99.5 kg)  Body mass index is 37.66 kg/m.        Physical Examination:   General appearance: Well appearing, and in no distress  Mental status: Alert, oriented to person, place, and time  Skin: Warm & dry  Cardiovascular: Normal heart rate noted  Respiratory: Normal respiratory effort, no distress  Abdomen: Soft, gravid, nontender  Pelvic: Cervical exam deferred         Extremities: Edema: Trace  Fetal Status: Fetal Heart Rate (bpm): 145 Fundal Height: 36 cm Movement: Present    No results found for this or any previous visit (from the past 24 hour(s)).  Assessment & Plan:  1) Low-risk pregnancy G4P0030 at [redacted]w[redacted]d with an Estimated Date of Delivery: 11/30/19   2) Supervision of high risk pregnancy, antepartum - Patient unsure if watebirth consent was signed; will sign at nv, if not signed already  3) Polyhydramnios affecting  pregnancy - FH c/w dates - F/U U/S on 11/06/19  4) Marginal insertion of umbilical cord affecting management of mother in third trimester - F/U U/S on 11/06/19  5) [redacted] weeks gestation of pregnancy    Meds: No orders of the defined types were placed in this encounter.  Labs/procedures today: none  Plan:  Continue routine obstetrical care   Reviewed: Preterm labor symptoms and general obstetric precautions including but not limited to vaginal bleeding, contractions, leaking of fluid and fetal movement were reviewed in detail with the patient.  All questions were answered. Has home bp cuff. Check bp weekly, let us know if >140/90.   Follow-up: Return in about 2 weeks (around 11/04/2019) for Return OB w/GBS.  No orders of the defined types were placed in this encounter.  Raelyn Mora MSN, CNM 10/21/2019 1:48 PM

## 2019-10-21 NOTE — Patient Instructions (Signed)

## 2019-11-04 ENCOUNTER — Other Ambulatory Visit: Payer: Self-pay

## 2019-11-04 ENCOUNTER — Other Ambulatory Visit (HOSPITAL_COMMUNITY)
Admission: RE | Admit: 2019-11-04 | Discharge: 2019-11-04 | Disposition: A | Discharge status: Home / Self Care | Payer: Medicaid Other | Source: Ambulatory Visit | Attending: Advanced Practice Midwife | Admitting: Advanced Practice Midwife

## 2019-11-04 ENCOUNTER — Ambulatory Visit (INDEPENDENT_AMBULATORY_CARE_PROVIDER_SITE_OTHER): Payer: Medicaid Other | Admitting: Advanced Practice Midwife

## 2019-11-04 VITALS — BP 119/84 | HR 110 | Wt 225.0 lb

## 2019-11-04 DIAGNOSIS — R12 Heartburn: Secondary | ICD-10-CM

## 2019-11-04 DIAGNOSIS — Z3493 Encounter for supervision of normal pregnancy, unspecified, third trimester: Secondary | ICD-10-CM

## 2019-11-04 DIAGNOSIS — O0990 Supervision of high risk pregnancy, unspecified, unspecified trimester: Secondary | ICD-10-CM | POA: Insufficient documentation

## 2019-11-04 DIAGNOSIS — O26893 Other specified pregnancy related conditions, third trimester: Secondary | ICD-10-CM

## 2019-11-04 MED ORDER — FAMOTIDINE 40 MG PO TABS: 40.0000 mg | ORAL_TABLET | Freq: Every day | ORAL | 1 refills | Status: DC

## 2019-11-04 NOTE — Patient Instructions (Signed)
Things to Try After 37 weeks to Encourage Labor/Get Ready for Labor:   1.  Try the Miles Circuit at www.milescircuit.com daily to improve baby's position and encourage the onset of labor.  2. Walk a little and rest a little every day.  Change positions often.  3. Cervical Ripening: May try one or both a. Red Raspberry Leaf capsules or tea:  two 300mg or 400mg tablets with each meal, 2-3 times a day, or 1-3 cups of tea daily  Potential Side Effects Of Raspberry Leaf:  Most women do not experience any side effects from drinking raspberry leaf tea. However, nausea and loose stools are possible   b. Evening Primrose Oil capsules: may take 1 to 3 capsules daily. Take 1-2 capsules by mouth each day and place one capsule vaginally at night.  You may also prick the vaginal capsule to release the oil prior to inserting in the vagina. Some of the potential side effects:  Upset stomach  Loose stools or diarrhea  Headaches  Nausea  4. Sex (and especially sex with orgasm) can also help the cervix ripen and encourage labor onset.  Labor Precautions Reasons to come to MAU at [facility]:  1.  Contractions are  5 minutes apart or less, each last 1 minute, these have been going on for 1-2 hours, and you cannot walk or talk during them 2.  You have a large gush of fluid, or a trickle of fluid that will not stop and you have to wear a pad 3.  You have bleeding that is bright red, heavier than spotting--like menstrual bleeding (spotting can be normal in early labor or after a check of your cervix) 4.  You do not feel the baby moving like he/she normally does 

## 2019-11-04 NOTE — Progress Notes (Signed)
   PRENATAL VISIT NOTE  Subjective:  Robyn Cummings is a 24 y.o. G4P0030 at [redacted]w[redacted]d being seen today for ongoing prenatal care.  She is currently monitored for the following issues for this low-risk pregnancy and has Posttraumatic amnesia; Cannabis abuse; Supervision of high risk pregnancy, antepartum; Trichomonas infection; Polyhydramnios affecting pregnancy; and Marginal insertion of umbilical cord affecting management of mother in third trimester on their problem list.  Patient reports pelvic pain of pregnancy.  Contractions: Irregular. Vag. Bleeding: None.  Movement: Present. Denies leaking of fluid.   The following portions of the patient's history were reviewed and updated as appropriate: allergies, current medications, past family history, past medical history, past social history, past surgical history and problem list.   Objective:   Vitals:   11/04/19 1347  BP: 119/84  Pulse: (!) 110  Weight: 225 lb (102.1 kg)    Fetal Status: Fetal Heart Rate (bpm): 141 Fundal Height: 38 cm Movement: Present  Presentation: Vertex  General:  Alert, oriented and cooperative. Patient is in no acute distress.  Skin: Skin is warm and dry. No rash noted.   Cardiovascular: Normal heart rate noted  Respiratory: Normal respiratory effort, no problems with respiration noted  Abdomen: Soft, gravid, appropriate for gestational age.  Pain/Pressure: Present     Pelvic: Cervical exam performed in the presence of a chaperone Dilation: Closed Effacement (%): 30 Station: Ballotable  Extremities: Normal range of motion.  Edema: Trace  Mental Status: Normal mood and affect. Normal behavior. Normal judgment and thought content.   Assessment and Plan:  Pregnancy: G4P0030 at [redacted]w[redacted]d 1. Encounter for supervision of low-risk pregnancy in third trimester --Anticipatory guidance about next visits/weeks of pregnancy given. --Pt still planning waterbirth, class completed, consent signed today - Strep Gp B NAA -  Cervicovaginal ancillary only( [place])  2. Heartburn during pregnancy in third trimester --Dietary changes today - famotidine (PEPCID) 40 MG tablet; Take 1 tablet (40 mg total) by mouth daily.  Dispense: 30 tablet; Refill: 1  Term labor symptoms and general obstetric precautions including but not limited to vaginal bleeding, contractions, leaking of fluid and fetal movement were reviewed in detail with the patient. Please refer to After Visit Summary for other counseling recommendations.   Return in about 2 weeks (around 11/18/2019).  Future Appointments  Date Time Provider Department Center  11/06/2019  9:30 AM [facility] NURSE [facility]  11/06/2019  9:45 AM [facility]    Sharen Counter, CNM

## 2019-11-04 NOTE — Progress Notes (Signed)
ROB/GBS.  C/o acid reflux, fatigue, pelvic pain, unable to sleep.

## 2019-11-05 ENCOUNTER — Telehealth: Payer: Self-pay | Admitting: Advanced Practice Midwife

## 2019-11-05 LAB — CERVICOVAGINAL ANCILLARY ONLY
Chlamydia: NEGATIVE
Comment: NEGATIVE
Comment: NORMAL
Neisseria Gonorrhea: NEGATIVE

## 2019-11-05 NOTE — Telephone Encounter (Signed)
Patient returned call.  I notified her about the decision from the transportation team to discontinue this service for her following the verbal incidents from yesterday.   She did elaborate may issues with [employer] not a being able to find her house on GPS and having to wait for hours.    Irregardless of the [employer]etto Goad issues, the service was discontinued due to her conduct.    We do have bus passes at the office available to her and will try to coordinate with Pregnancy Case Worker to get those to her.

## 2019-11-05 NOTE — Telephone Encounter (Signed)
Telephone call to patient to discuss incident with transportation services.  [name] cursed the transportation team out yesterday because they would not take her to [employer] from [employer].  They tried to explain that the [employer] are for to and from [facility] appointments only.    The manager of Transportation services is reviewing incident and will let me know if [name] will be discharged from services.   I was calling her to discuss the repercussions with [name].

## 2019-11-06 ENCOUNTER — Ambulatory Visit: Payer: Medicaid Other | Admitting: *Deleted

## 2019-11-06 ENCOUNTER — Other Ambulatory Visit: Payer: Self-pay

## 2019-11-06 ENCOUNTER — Encounter: Payer: Self-pay | Admitting: *Deleted

## 2019-11-06 ENCOUNTER — Ambulatory Visit: Payer: Medicaid Other | Attending: Obstetrics and Gynecology

## 2019-11-06 ENCOUNTER — Telehealth: Payer: Self-pay | Admitting: Obstetrics

## 2019-11-06 DIAGNOSIS — O409XX0 Polyhydramnios, unspecified trimester, not applicable or unspecified: Secondary | ICD-10-CM | POA: Diagnosis present

## 2019-11-06 DIAGNOSIS — O43193 Other malformation of placenta, third trimester: Secondary | ICD-10-CM | POA: Diagnosis present

## 2019-11-06 DIAGNOSIS — Z362 Encounter for other antenatal screening follow-up: Secondary | ICD-10-CM

## 2019-11-06 DIAGNOSIS — O99213 Obesity complicating pregnancy, third trimester: Secondary | ICD-10-CM

## 2019-11-06 DIAGNOSIS — Z3A36 36 weeks gestation of pregnancy: Secondary | ICD-10-CM

## 2019-11-06 DIAGNOSIS — E669 Obesity, unspecified: Secondary | ICD-10-CM | POA: Diagnosis not present

## 2019-11-06 DIAGNOSIS — O403XX0 Polyhydramnios, third trimester, not applicable or unspecified: Secondary | ICD-10-CM

## 2019-11-06 DIAGNOSIS — O43199 Other malformation of placenta, unspecified trimester: Secondary | ICD-10-CM

## 2019-11-06 LAB — STREP GP B NAA: Strep Gp B NAA: NEGATIVE

## 2019-11-09 ENCOUNTER — Telehealth: Payer: Self-pay

## 2019-11-09 ENCOUNTER — Ambulatory Visit: Payer: Self-pay | Admitting: *Deleted

## 2019-11-09 ENCOUNTER — Encounter (HOSPITAL_COMMUNITY): Payer: Self-pay | Admitting: Obstetrics and Gynecology

## 2019-11-09 ENCOUNTER — Inpatient Hospital Stay (HOSPITAL_COMMUNITY)
Admission: AD | Admit: 2019-11-09 | Discharge: 2019-11-09 | Disposition: A | Discharge status: Home / Self Care | Payer: Medicaid Other | Attending: Obstetrics and Gynecology | Admitting: Obstetrics and Gynecology

## 2019-11-09 DIAGNOSIS — I10 Essential (primary) hypertension: Secondary | ICD-10-CM

## 2019-11-09 DIAGNOSIS — Z20822 Contact with and (suspected) exposure to covid-19: Secondary | ICD-10-CM | POA: Insufficient documentation

## 2019-11-09 DIAGNOSIS — R030 Elevated blood-pressure reading, without diagnosis of hypertension: Secondary | ICD-10-CM | POA: Diagnosis not present

## 2019-11-09 DIAGNOSIS — O99891 Other specified diseases and conditions complicating pregnancy: Secondary | ICD-10-CM | POA: Diagnosis not present

## 2019-11-09 DIAGNOSIS — Z0371 Encounter for suspected problem with amniotic cavity and membrane ruled out: Secondary | ICD-10-CM | POA: Diagnosis not present

## 2019-11-09 DIAGNOSIS — O26893 Other specified pregnancy related conditions, third trimester: Secondary | ICD-10-CM | POA: Diagnosis not present

## 2019-11-09 DIAGNOSIS — Z3A37 37 weeks gestation of pregnancy: Secondary | ICD-10-CM | POA: Insufficient documentation

## 2019-11-09 DIAGNOSIS — O479 False labor, unspecified: Secondary | ICD-10-CM

## 2019-11-09 LAB — WET PREP, GENITAL
Clue Cells Wet Prep HPF POC: NONE SEEN
Sperm: NONE SEEN
Trich, Wet Prep: NONE SEEN
Yeast Wet Prep HPF POC: NONE SEEN

## 2019-11-09 LAB — URINALYSIS, ROUTINE W REFLEX MICROSCOPIC
Bilirubin Urine: NEGATIVE
Glucose, UA: NEGATIVE mg/dL
Hgb urine dipstick: NEGATIVE
Ketones, ur: NEGATIVE mg/dL
Leukocytes,Ua: NEGATIVE
Nitrite: NEGATIVE
Protein, ur: NEGATIVE mg/dL
Specific Gravity, Urine: 1.021 (ref 1.005–1.030)
pH: 7 (ref 5.0–8.0)

## 2019-11-09 LAB — CBC
HCT: 31.6 % — ABNORMAL LOW (ref 36.0–46.0)
Hemoglobin: 10.2 g/dL — ABNORMAL LOW (ref 12.0–15.0)
MCH: 27.1 pg (ref 26.0–34.0)
MCHC: 32.3 g/dL (ref 30.0–36.0)
MCV: 83.8 fL (ref 80.0–100.0)
Platelets: 361 10*3/uL (ref 150–400)
RBC: 3.77 MIL/uL — ABNORMAL LOW (ref 3.87–5.11)
RDW: 13.6 % (ref 11.5–15.5)
WBC: 12 10*3/uL — ABNORMAL HIGH (ref 4.0–10.5)
nRBC: 0 % (ref 0.0–0.2)

## 2019-11-09 LAB — COMPREHENSIVE METABOLIC PANEL
ALT: 16 U/L (ref 0–44)
AST: 12 U/L — ABNORMAL LOW (ref 15–41)
Albumin: 2.2 g/dL — ABNORMAL LOW (ref 3.5–5.0)
Alkaline Phosphatase: 235 U/L — ABNORMAL HIGH (ref 38–126)
Anion gap: 10 (ref 5–15)
BUN: 8 mg/dL (ref 6–20)
CO2: 20 mmol/L — ABNORMAL LOW (ref 22–32)
Calcium: 8.8 mg/dL — ABNORMAL LOW (ref 8.9–10.3)
Chloride: 104 mmol/L (ref 98–111)
Creatinine, Ser: 0.53 mg/dL (ref 0.44–1.00)
GFR, Estimated: >60 mL/min (ref >60)
Glucose, Bld: 88 mg/dL (ref 70–99)
Potassium: 3.9 mmol/L (ref 3.5–5.1)
Sodium: 134 mmol/L — ABNORMAL LOW (ref 135–145)
Total Bilirubin: 0.5 mg/dL (ref 0.3–1.2)
Total Protein: 6.4 g/dL — ABNORMAL LOW (ref 6.5–8.1)

## 2019-11-09 LAB — RAPID URINE DRUG SCREEN, HOSP PERFORMED
Amphetamines: NOT DETECTED
Barbiturates: NOT DETECTED
Benzodiazepines: NOT DETECTED
Cocaine: NOT DETECTED
Opiates: POSITIVE — AB
Tetrahydrocannabinol: NOT DETECTED

## 2019-11-09 LAB — PROTEIN / CREATININE RATIO, URINE
Creatinine, Urine: 193.68 mg/dL
Protein Creatinine Ratio: 0.07 mg/mg{Cre} (ref 0.00–0.15)
Total Protein, Urine: 14 mg/dL

## 2019-11-09 LAB — RESPIRATORY PANEL BY RT PCR (FLU A&B, COVID)
Influenza A by PCR: NEGATIVE
Influenza B by PCR: NEGATIVE
SARS Coronavirus 2 by RT PCR: NEGATIVE

## 2019-11-09 LAB — AMNISURE RUPTURE OF MEMBRANE (ROM) NOT AT ARMC: Amnisure ROM: NEGATIVE

## 2019-11-09 LAB — POCT FERN TEST: POCT Fern Test: NEGATIVE

## 2019-11-09 MED ORDER — MORPHINE SULFATE (PF) 4 MG/ML IV SOLN: 2.0000 mg | INTRAVENOUS | Status: DC | PRN

## 2019-11-09 MED ORDER — MORPHINE SULFATE (PF) 4 MG/ML IV SOLN
INTRAVENOUS | Status: AC
  Administered 2019-11-09: 2 mg via INTRAMUSCULAR
  Filled 2019-11-09: qty 1

## 2019-11-09 MED ORDER — LACTATED RINGERS IV SOLN: INTRAVENOUS | Status: DC

## 2019-11-09 NOTE — MAU Note (Signed)
Spoke with Marylene Land in [facility] pharmacy and she reported that administering the LR would be fine, although there was a contraindication for the patient because she is allergic to limes.

## 2019-11-09 NOTE — MAU Provider Note (Addendum)
S: [name] is a 24 y.o. G4P0030 at [redacted]w[redacted]d  who presents to MAU today complaining of leaking of fluid since three days. She denies vaginal bleeding. She endorses contractions. She reports normal fetal movement.    O: BP 119/83 (BP Location: Right Arm)   Pulse 85   LMP 02/23/2019  GENERAL: Well-developed, well-nourished female in no acute distress.  HEAD: Normocephalic, atraumatic.  CHEST: Normal effort of breathing, regular heart rate ABDOMEN: Soft, nontender, gravid PELVIC: Normal external female genitalia. Vagina is pink and rugated. Cervix with normal contour, no lesions. Normal thick white discharge.  Negative pooling.   Cervical exam:  Dilation: Fingertip Presentation: Vertex (Confirmed by bedside ultrasound) Exam by:: Dr. Barb Cummings   Fetal Monitoring: Baseline: 130 Variability: mod Accelerations: present Decelerations: decel Contractions: quiescent  Results for orders placed or performed during the hospital encounter of 11/09/19 (from the past 24 hour(s))  Protein / creatinine ratio, urine     Status: None   Collection Time: 11/09/19  7:14 PM  Result Value Ref Range   Creatinine, Urine 193.68 mg/dL   Total Protein, Urine 14 mg/dL   Protein Creatinine Ratio 0.07 0.00 - 0.15 mg/mg[Cre]  Comprehensive metabolic panel     Status: Abnormal   Collection Time: 11/09/19  7:34 PM  Result Value Ref Range   Sodium 134 (L) 135 - 145 mmol/L   Potassium 3.9 3.5 - 5.1 mmol/L   Chloride 104 98 - 111 mmol/L   CO2 20 (L) 22 - 32 mmol/L   Glucose, Bld 88 70 - 99 mg/dL   BUN 8 6 - 20 mg/dL   Creatinine, Ser 0.86 0.44 - 1.00 mg/dL   Calcium 8.8 (L) 8.9 - 10.3 mg/dL   Total Protein 6.4 (L) 6.5 - 8.1 g/dL   Albumin 2.2 (L) 3.5 - 5.0 g/dL   AST 12 (L) 15 - 41 U/L   ALT 16 0 - 44 U/L   Alkaline Phosphatase 235 (H) 38 - 126 U/L   Total Bilirubin 0.5 0.3 - 1.2 mg/dL   GFR, Estimated >57 >84 mL/min   Anion gap 10 5 - 15  CBC     Status: Abnormal   Collection Time: 11/09/19   7:34 PM  Result Value Ref Range   WBC 12.0 (H) 4.0 - 10.5 K/uL   RBC 3.77 (L) 3.87 - 5.11 MIL/uL   Hemoglobin 10.2 (L) 12.0 - 15.0 g/dL   HCT 69.6 (L) 36 - 46 %   MCV 83.8 80.0 - 100.0 fL   MCH 27.1 26.0 - 34.0 pg   MCHC 32.3 30.0 - 36.0 g/dL   RDW 29.5 28.4 - 13.2 %   Platelets 361 150 - 400 K/uL   nRBC 0.0 0.0 - 0.2 %  Type and screen [facility]     Status: None   Collection Time: 11/09/19  7:37 PM  Result Value Ref Range   ABO/RH(D) O POS    Antibody Screen NEG    Sample Expiration      11/12/2019,2359 Performed at [facility], [address], [place], [place] [address]   Amnisure rupture of membrane (rom)not at [facility]     Status: None   Collection Time: 11/09/19  7:47 PM  Result Value Ref Range   Amnisure ROM NEGATIVE   Urinalysis, Routine w reflex microscopic Urine, Clean Catch     Status: Abnormal   Collection Time: 11/09/19  7:47 PM  Result Value Ref Range   Color, Urine YELLOW YELLOW  APPearance HAZY (A) CLEAR   Specific Gravity, Urine 1.021 1.005 - 1.030   pH 7.0 5.0 - 8.0   Glucose, UA NEGATIVE NEGATIVE mg/dL   Hgb urine dipstick NEGATIVE NEGATIVE   Bilirubin Urine NEGATIVE NEGATIVE   Ketones, ur NEGATIVE NEGATIVE mg/dL   Protein, ur NEGATIVE NEGATIVE mg/dL   Nitrite NEGATIVE NEGATIVE   Leukocytes,Ua NEGATIVE NEGATIVE  Urine rapid drug screen (hosp performed)     Status: Abnormal   Collection Time: 11/09/19  7:47 PM  Result Value Ref Range   Opiates POSITIVE (A) NONE DETECTED   Cocaine NONE DETECTED NONE DETECTED   Benzodiazepines NONE DETECTED NONE DETECTED   Amphetamines NONE DETECTED NONE DETECTED   Tetrahydrocannabinol NONE DETECTED NONE DETECTED   Barbiturates NONE DETECTED NONE DETECTED  Wet prep, genital     Status: Abnormal   Collection Time: 11/09/19  7:50 PM  Result Value Ref Range   Yeast Wet Prep HPF POC NONE SEEN NONE SEEN   Trich, Wet Prep NONE SEEN NONE SEEN   Clue Cells Wet Prep HPF POC NONE SEEN NONE SEEN   WBC,  Wet Prep HPF POC MODERATE (A) NONE SEEN   Sperm NONE SEEN     Patient Vitals for the past 24 hrs:  BP Pulse  11/09/19 2116 122/82 100  11/09/19 2105 119/81 80  11/09/19 2046 129/86 95  11/09/19 2031 119/83 85  11/09/19 2016 (!) 137/95 (!) 113  11/09/19 2001 140/87 (!) 103  11/09/19 1947 (!) 134/93 (!) 109  11/09/19 1915 134/80 90    -will do wet prep  And amnisure  -will double check pre-e labs as patient had one elevated pressure when she arrived, however, could be due to pain.    A: SIUP at [redacted]w[redacted]d  Patient's amnisure is negative -sterile fern is negative -amnisure is pending P: Patient to be observed until pre-e labs come back.  -patient not eligible for WB based on GHTN/HTN  Patient care endorsed to Robyn Cummings CNM with pre-e labs pending  Robyn Cummings, CNM 11/09/2019 8:32 PM   PEC labs negative  Dr Robyn Cummings - recommends discharge home with close follow up in the office for BP check - ?CHTN.  BP check in office tomorrow and OB appointment as scheduled.  PEC precautions reviewed  Discharge home in stable condition  Return to MAU as needed for reasons discussed and/or emergencies   Robyn Cummings, CNM 11/09/19, 9:20 PM

## 2019-11-09 NOTE — Telephone Encounter (Signed)
Pt alerted OB/Gyn earlier today (1300) she may be leaking amniotic fluid. States [redacted] weeks pregnant. OB/GYN advised to be checked at hospital. Pt calling now questioning how she can tell if amniotic fluid. States she doesn't want to go to hospital as advised.  Advised pt to follow OB's advise. Unsure if pt will go to get checked out. Reiterated need  To be checked, pt verbalizes understanding.   Reason for Disposition  Nursing judgment    Follow OBs advise to go be checked  Protocols used: NO GUIDELINE OR REFERENCE AVAILABLE-A-AH

## 2019-11-09 NOTE — MAU Note (Signed)
Pt is a G1P0 at 37.0 week arrived by EMS c/o frequent strong contractions.

## 2019-11-09 NOTE — Telephone Encounter (Signed)
Refill called from patient -she reports leaking of fluids all weekend and she is concerned. She reports baby is moving right now. I have advised her to follow up at [facility] unit to be check out. Patient voice understanding.

## 2019-11-10 ENCOUNTER — Inpatient Hospital Stay (HOSPITAL_COMMUNITY)
Admission: AD | Admit: 2019-11-10 | Discharge: 2019-11-10 | Disposition: A | Discharge status: Home / Self Care | Payer: Medicaid Other | Attending: Obstetrics and Gynecology | Admitting: Obstetrics and Gynecology

## 2019-11-10 ENCOUNTER — Ambulatory Visit: Payer: Medicaid Other

## 2019-11-10 ENCOUNTER — Ambulatory Visit (INDEPENDENT_AMBULATORY_CARE_PROVIDER_SITE_OTHER): Payer: Medicaid Other | Admitting: Obstetrics and Gynecology

## 2019-11-10 ENCOUNTER — Encounter: Payer: Self-pay | Admitting: Obstetrics and Gynecology

## 2019-11-10 ENCOUNTER — Encounter (HOSPITAL_COMMUNITY): Payer: Self-pay | Admitting: Obstetrics and Gynecology

## 2019-11-10 ENCOUNTER — Other Ambulatory Visit: Payer: Self-pay

## 2019-11-10 VITALS — BP 124/81 | HR 96 | Wt 223.2 lb

## 2019-11-10 DIAGNOSIS — G8929 Other chronic pain: Secondary | ICD-10-CM

## 2019-11-10 DIAGNOSIS — R519 Headache, unspecified: Secondary | ICD-10-CM | POA: Diagnosis not present

## 2019-11-10 DIAGNOSIS — O99891 Other specified diseases and conditions complicating pregnancy: Secondary | ICD-10-CM | POA: Diagnosis not present

## 2019-11-10 DIAGNOSIS — M549 Dorsalgia, unspecified: Secondary | ICD-10-CM

## 2019-11-10 DIAGNOSIS — O409XX0 Polyhydramnios, unspecified trimester, not applicable or unspecified: Secondary | ICD-10-CM

## 2019-11-10 DIAGNOSIS — O99333 Smoking (tobacco) complicating pregnancy, third trimester: Secondary | ICD-10-CM | POA: Insufficient documentation

## 2019-11-10 DIAGNOSIS — F17210 Nicotine dependence, cigarettes, uncomplicated: Secondary | ICD-10-CM | POA: Insufficient documentation

## 2019-11-10 DIAGNOSIS — R030 Elevated blood-pressure reading, without diagnosis of hypertension: Secondary | ICD-10-CM | POA: Diagnosis not present

## 2019-11-10 DIAGNOSIS — I10 Essential (primary) hypertension: Secondary | ICD-10-CM

## 2019-11-10 DIAGNOSIS — N9489 Other specified conditions associated with female genital organs and menstrual cycle: Secondary | ICD-10-CM

## 2019-11-10 DIAGNOSIS — Z3689 Encounter for other specified antenatal screening: Secondary | ICD-10-CM

## 2019-11-10 DIAGNOSIS — O26893 Other specified pregnancy related conditions, third trimester: Secondary | ICD-10-CM | POA: Diagnosis not present

## 2019-11-10 DIAGNOSIS — O0990 Supervision of high risk pregnancy, unspecified, unspecified trimester: Secondary | ICD-10-CM

## 2019-11-10 DIAGNOSIS — O10913 Unspecified pre-existing hypertension complicating pregnancy, third trimester: Secondary | ICD-10-CM

## 2019-11-10 DIAGNOSIS — R0789 Other chest pain: Secondary | ICD-10-CM

## 2019-11-10 DIAGNOSIS — O10919 Unspecified pre-existing hypertension complicating pregnancy, unspecified trimester: Secondary | ICD-10-CM

## 2019-11-10 DIAGNOSIS — O43193 Other malformation of placenta, third trimester: Secondary | ICD-10-CM

## 2019-11-10 DIAGNOSIS — R102 Pelvic and perineal pain: Secondary | ICD-10-CM

## 2019-11-10 DIAGNOSIS — Z3A37 37 weeks gestation of pregnancy: Secondary | ICD-10-CM | POA: Insufficient documentation

## 2019-11-10 DIAGNOSIS — N949 Unspecified condition associated with female genital organs and menstrual cycle: Secondary | ICD-10-CM

## 2019-11-10 DIAGNOSIS — Z0371 Encounter for suspected problem with amniotic cavity and membrane ruled out: Secondary | ICD-10-CM | POA: Diagnosis not present

## 2019-11-10 HISTORY — DX: Unspecified infectious disease: B99.9

## 2019-11-10 HISTORY — DX: Essential (primary) hypertension: I10

## 2019-11-10 HISTORY — DX: Unspecified asthma, uncomplicated: J45.909

## 2019-11-10 LAB — URINALYSIS, ROUTINE W REFLEX MICROSCOPIC
Bilirubin Urine: NEGATIVE
Glucose, UA: NEGATIVE mg/dL
Hgb urine dipstick: NEGATIVE
Ketones, ur: NEGATIVE mg/dL
Leukocytes,Ua: NEGATIVE
Nitrite: NEGATIVE
Protein, ur: NEGATIVE mg/dL
Specific Gravity, Urine: 1.013 (ref 1.005–1.030)
pH: 6 (ref 5.0–8.0)

## 2019-11-10 LAB — CBC WITH DIFFERENTIAL/PLATELET
Abs Immature Granulocytes: 0.07 10*3/uL (ref 0.00–0.07)
Basophils Absolute: 0 10*3/uL (ref 0.0–0.1)
Basophils Relative: 0 %
Eosinophils Absolute: 0.1 10*3/uL (ref 0.0–0.5)
Eosinophils Relative: 1 %
HCT: 32.1 % — ABNORMAL LOW (ref 36.0–46.0)
Hemoglobin: 10.2 g/dL — ABNORMAL LOW (ref 12.0–15.0)
Immature Granulocytes: 1 %
Lymphocytes Relative: 18 %
Lymphs Abs: 2.1 10*3/uL (ref 0.7–4.0)
MCH: 27.1 pg (ref 26.0–34.0)
MCHC: 31.8 g/dL (ref 30.0–36.0)
MCV: 85.4 fL (ref 80.0–100.0)
Monocytes Absolute: 0.7 10*3/uL (ref 0.1–1.0)
Monocytes Relative: 6 %
Neutro Abs: 8.7 10*3/uL — ABNORMAL HIGH (ref 1.7–7.7)
Neutrophils Relative %: 74 %
Platelets: 353 10*3/uL (ref 150–400)
RBC: 3.76 MIL/uL — ABNORMAL LOW (ref 3.87–5.11)
RDW: 13.9 % (ref 11.5–15.5)
WBC: 11.8 10*3/uL — ABNORMAL HIGH (ref 4.0–10.5)
nRBC: 0 % (ref 0.0–0.2)

## 2019-11-10 LAB — COMPREHENSIVE METABOLIC PANEL
ALT: 19 U/L (ref 0–44)
AST: 14 U/L — ABNORMAL LOW (ref 15–41)
Albumin: 2.3 g/dL — ABNORMAL LOW (ref 3.5–5.0)
Alkaline Phosphatase: 245 U/L — ABNORMAL HIGH (ref 38–126)
Anion gap: 10 (ref 5–15)
BUN: 8 mg/dL (ref 6–20)
CO2: 20 mmol/L — ABNORMAL LOW (ref 22–32)
Calcium: 8.9 mg/dL (ref 8.9–10.3)
Chloride: 106 mmol/L (ref 98–111)
Creatinine, Ser: 0.47 mg/dL (ref 0.44–1.00)
GFR, Estimated: >60 mL/min (ref >60)
Glucose, Bld: 88 mg/dL (ref 70–99)
Potassium: 4.1 mmol/L (ref 3.5–5.1)
Sodium: 136 mmol/L (ref 135–145)
Total Bilirubin: 0.4 mg/dL (ref 0.3–1.2)
Total Protein: 6.4 g/dL — ABNORMAL LOW (ref 6.5–8.1)

## 2019-11-10 LAB — TYPE AND SCREEN
ABO/RH(D): O POS
Antibody Screen: NEGATIVE

## 2019-11-10 LAB — RPR: RPR Ser Ql: NONREACTIVE

## 2019-11-10 LAB — PROTEIN / CREATININE RATIO, URINE
Creatinine, Urine: 120.52 mg/dL
Protein Creatinine Ratio: 0.06 mg/mg{Cre} (ref 0.00–0.15)
Total Protein, Urine: 7 mg/dL

## 2019-11-10 MED ORDER — METOCLOPRAMIDE HCL 5 MG/ML IJ SOLN
10.0000 mg | Freq: Once | INTRAMUSCULAR | Status: AC
  Administered 2019-11-10: 10 mg via INTRAVENOUS
  Filled 2019-11-10: qty 2

## 2019-11-10 MED ORDER — LACTATED RINGERS IV BOLUS
1000.0000 mL | Freq: Once | INTRAVENOUS | Status: AC
  Administered 2019-11-10: 1000 mL via INTRAVENOUS

## 2019-11-10 MED ORDER — DEXAMETHASONE SODIUM PHOSPHATE 10 MG/ML IJ SOLN
10.0000 mg | Freq: Once | INTRAMUSCULAR | Status: AC
  Administered 2019-11-10: 10 mg via INTRAVENOUS
  Filled 2019-11-10: qty 1

## 2019-11-10 MED ORDER — COMFORT FIT MATERNITY SUPP LG MISC: 1.0000 [IU] | Freq: Every day | 0 refills | Status: DC

## 2019-11-10 MED ORDER — DIPHENHYDRAMINE HCL 50 MG/ML IJ SOLN
25.0000 mg | Freq: Once | INTRAMUSCULAR | Status: AC
  Administered 2019-11-10: 25 mg via INTRAVENOUS
  Filled 2019-11-10: qty 1

## 2019-11-10 NOTE — Discharge Instructions (Signed)
Round Ligament Pain  The round ligament is a cord of muscle and tissue that helps support the uterus. It can become a source of pain during pregnancy if it becomes stretched or twisted as the baby grows. The pain usually begins in the second trimester (13-28 weeks) of pregnancy, and it can come and go until the baby is delivered. It is not a serious problem, and it does not cause harm to the baby. Round ligament pain is usually a short, sharp, and pinching pain, but it can also be a dull, lingering, and aching pain. The pain is felt in the lower side of the abdomen or in the groin. It usually starts deep in the groin and moves up to the outside of the hip area. The pain may occur when you:  Suddenly change position, such as quickly going from a sitting to standing position.  Roll over in bed.  Cough or sneeze.  Do physical activity. Follow these instructions at home:   Watch your condition for any changes.  When the pain starts, relax. Then try any of these methods to help with the pain: ? Sitting down. ? Flexing your knees up to your abdomen. ? Lying on your side with one pillow under your abdomen and another pillow between your legs. ? Sitting in a warm bath for 15-20 minutes or until the pain goes away.  Take over-the-counter and prescription medicines only as told by your health care provider.  Move slowly when you sit down or stand up.  Avoid long walks if they cause pain.  Stop or reduce your physical activities if they cause pain.  Keep all follow-up visits as told by your health care provider. This is important. Contact a health care provider if:  Your pain does not go away with treatment.  You feel pain in your back that you did not have before.  Your medicine is not helping. Get help right away if:  You have a fever or chills.  You develop uterine contractions.  You have vaginal bleeding.  You have nausea or vomiting.  You have diarrhea.  You have pain  when you urinate. Summary  Round ligament pain is felt in the lower abdomen or groin. It is usually a short, sharp, and pinching pain. It can also be a dull, lingering, and aching pain.  This pain usually begins in the second trimester (13-28 weeks). It occurs because the uterus is stretching with the growing baby, and it is not harmful to the baby.  You may notice the pain when you suddenly change position, when you cough or sneeze, or during physical activity.  Relaxing, flexing your knees to your abdomen, lying on one side, or taking a warm bath may help to get rid of the pain.  Get help from your health care provider if the pain does not go away or if you have vaginal bleeding, nausea, vomiting, diarrhea, or painful urination. This information is not intended to replace advice given to you by your health care provider. Make sure you discuss any questions you have with your health care provider. Document Revised: 06/05/2017 Document Reviewed: 06/05/2017 Elsevier Patient Education  2020 Else[employer]. Back Pain in Pregnancy Back pain during pregnancy is common. Back pain may be caused by several factors that are related to changes during your pregnancy. Follow these instructions at home: Managing pain, stiffness, and swelling      If directed, for sudden (acute) back pain, put ice on the painful area. ? Put  ice in a plastic bag. ? Place a towel between your skin and the bag. ? Leave the ice on for 20 minutes, 2-3 times per day.  If directed, apply heat to the affected area before you exercise. Use the heat source that your health care provider recommends, such as a moist heat pack or a heating pad. ? Place a towel between your skin and the heat source. ? Leave the heat on for 20-30 minutes. ? Remove the heat if your skin turns bright red. This is especially important if you are unable to feel pain, heat, or cold. You may have a greater risk of getting burned.  If directed, massage  the affected area. Activity  Exercise as told by your health care provider. Gentle exercise is the best way to prevent or manage back pain.  Listen to your body when lifting. If lifting hurts, ask for help or bend your knees. This uses your leg muscles instead of your back muscles.  Squat down when picking up something from the floor. Do not bend over.  Only use bed rest for short periods as told by your health care provider. Bed rest should only be used for the most severe episodes of back pain. Standing, sitting, and lying down  Do not stand in one place for long periods of time.  Use good posture when sitting. Make sure your head rests over your shoulders and is not hanging forward. Use a pillow on your lower back if necessary.  Try sleeping on your side, preferably the left side, with a pregnancy support pillow or 1-2 regular pillows between your legs. ? If you have back pain after a night's rest, your bed may be too soft. ? A firm mattress may provide more support for your back during pregnancy. General instructions  Do not wear high heels.  Eat a healthy diet. Try to gain weight within your health care provider's recommendations.  Use a maternity girdle, elastic sling, or back brace as told by your health care provider.  Take over-the-counter and prescription medicines only as told by your health care provider.  Work with a physical therapist or massage therapist to find ways to manage back pain. Acupuncture or massage therapy may be helpful.  Keep all follow-up visits as told by your health care provider. This is important. Contact a health care provider if:  Your back pain interferes with your daily activities.  You have increasing pain in other parts of your body. Get help right away if:  You develop numbness, tingling, weakness, or problems with the use of your arms or legs.  You develop severe back pain that is not controlled with medicine.  You have a change in  bowel or bladder control.  You develop shortness of breath, dizziness, or you faint.  You develop nausea, vomiting, or sweating.  You have back pain that is a rhythmic, cramping pain similar to labor pains. Labor pain is usually 1-2 minutes apart, lasts for about 1 minute, and involves a bearing down feeling or pressure in your pelvis.  You have back pain and your water breaks or you have vaginal bleeding.  You have back pain or numbness that travels down your leg.  Your back pain developed after you fell.  You develop pain on one side of your back.  You see blood in your urine.  You develop skin blisters in the area of your back pain. Summary  Back pain may be caused by several factors that are related  to changes during your pregnancy.  Follow instructions as told by your health care provider for managing pain, stiffness, and swelling.  Exercise as told by your health care provider. Gentle exercise is the best way to prevent or manage back pain.  Take over-the-counter and prescription medicines only as told by your health care provider.  Keep all follow-up visits as told by your health care provider. This is important. This information is not intended to replace advice given to you by your health care provider. Make sure you discuss any questions you have with your health care provider. Document Revised: 04/08/2018 Document Reviewed: 06/05/2017 Elsevier Patient Education  2020 Else[employer] Inc. Preeclampsia and Eclampsia Preeclampsia is a serious condition that may develop during pregnancy. This condition causes high blood pressure and increased protein in your urine along with other symptoms, such as headaches and vision changes. These symptoms may develop as the condition gets worse. Preeclampsia may occur at 20 weeks of pregnancy or later. Diagnosing and treating preeclampsia early is very important. If not treated early, it can cause serious problems for you and your baby. One  problem it can lead to is eclampsia. Eclampsia is a condition that causes muscle jerking or shaking (convulsions or seizures) and other serious problems for the mother. During pregnancy, delivering your baby may be the best treatment for preeclampsia or eclampsia. For most women, preeclampsia and eclampsia symptoms go away after giving birth. In rare cases, a woman may develop preeclampsia after giving birth (postpartum preeclampsia). This usually occurs within 48 hours after childbirth but may occur up to 6 weeks after giving birth. What are the causes? The cause of preeclampsia is not known. What increases the risk? The following risk factors make you more likely to develop preeclampsia:  Being pregnant for the first time.  Having had preeclampsia during a past pregnancy.  Having a family history of preeclampsia.  Having high blood pressure.  Being pregnant with more than one baby.  Being 11 or older.  Being African-American.  Having kidney disease or diabetes.  Having medical conditions such as lupus or blood diseases.  Being very overweight (obese). What are the signs or symptoms? The most common symptoms are:  Severe headaches.  Vision problems, such as blurred or double vision.  Abdominal pain, especially upper abdominal pain. Other symptoms that may develop as the condition gets worse include:  Sudden weight gain.  Sudden swelling of the hands, face, legs, and feet.  Severe nausea and vomiting.  Numbness in the face, arms, legs, and feet.  Dizziness.  Urinating less than usual.  Slurred speech.  Convulsions or seizures. How is this diagnosed? There are no screening tests for preeclampsia. Your health care provider will ask you about symptoms and check for signs of preeclampsia during your prenatal visits. You may also have tests that include:  Checking your blood pressure.  Urine tests to check for protein. Your health care provider will check for this  at every prenatal visit.  Blood tests.  Monitoring your baby's heart rate.  Ultrasound. How is this treated? You and your health care provider will determine the treatment approach that is best for you. Treatment may include:  Having more frequent prenatal exams to check for signs of preeclampsia, if you have an increased risk for preeclampsia.  Medicine to lower your blood pressure.  Staying in the hospital, if your condition is severe. There, treatment will focus on controlling your blood pressure and the amount of fluids in your body (fluid retention).  Taking medicine (magnesium sulfate) to prevent seizures. This may be given as an injection or through an IV.  Taking a low-dose aspirin during your pregnancy.  Delivering your baby early. You may have your labor started with medicine (induced), or you may have a cesarean delivery. Follow these instructions at home: Eating and drinking   Drink enough fluid to keep your urine pale yellow.  Avoid caffeine. Lifestyle  Do not use any products that contain nicotine or tobacco, such as cigarettes and e-cigarettes. If you need help quitting, ask your health care provider.  Do not use alcohol or drugs.  Avoid stress as much as possible. Rest and get plenty of sleep. General instructions  Take over-the-counter and prescription medicines only as told by your health care provider.  When lying down, lie on your left side. This keeps pressure off your major blood vessels.  When sitting or lying down, raise (elevate) your feet. Try putting some pillows underneath your lower legs.  Exercise regularly. Ask your health care provider what kinds of exercise are best for you.  Keep all follow-up and prenatal visits as told by your health care provider. This is important. How is this prevented? There is no known way of preventing preeclampsia or eclampsia from developing. However, to lower your risk of complications and detect problems  early:  Get regular prenatal care. Your health care provider may be able to diagnose and treat the condition early.  Maintain a healthy weight. Ask your health care provider for help managing weight gain during pregnancy.  Work with your health care provider to manage any long-term (chronic) health conditions you have, such as diabetes or kidney problems.  You may have tests of your blood pressure and kidney function after giving birth.  Your health care provider may have you take low-dose aspirin during your next pregnancy. Contact a health care provider if:  You have symptoms that your health care provider told you may require more treatment or monitoring, such as: ? Headaches. ? Nausea or vomiting. ? Abdominal pain. ? Dizziness. ? Light-headedness. Get help right away if:  You have severe: ? Abdominal pain. ? Headaches that do not get better. ? Dizziness. ? Vision problems. ? Confusion. ? Nausea or vomiting.  You have any of the following: ? A seizure. ? Sudden, rapid weight gain. ? Sudden swelling in your hands, ankles, or face. ? Trouble moving any part of your body. ? Numbness in any part of your body. ? Trouble speaking. ? Abnormal bleeding.  You faint. Summary  Preeclampsia is a serious condition that may develop during pregnancy.  This condition causes high blood pressure and increased protein in your urine along with other symptoms, such as headaches and vision changes.  Diagnosing and treating preeclampsia early is very important. If not treated early, it can cause serious problems for you and your baby.  Get help right away if you have symptoms that your health care provider told you to watch for. This information is not intended to replace advice given to you by your health care provider. Make sure you discuss any questions you have with your health care provider. Document Revised: 08/20/2017 Document Reviewed: 07/25/2015 Elsevier Patient Education   2020 Else[employer] Inc.    PREGNANCY SUPPORT BELT: You are not alone, Seventy-five percent of women have some sort of abdominal or back pain at some point in their pregnancy. Your baby is growing at a fast pace, which means that your whole body is rapidly trying to adjust  to the changes. As your uterus grows, your back may start feeling a bit under stress and this can result in back or abdominal pain that can go from mild, and therefore bearable, to severe pains that will not allow you to sit or lay down comfortably, When it comes to dealing with pregnancy-related pains and cramps, some pregnant women usually prefer natural remedies, which the market is filled with nowadays. For example, wearing a pregnancy support belt can help ease and lessen your discomfort and pain. WHAT ARE THE BENEFITS OF WEARING A PREGNANCY SUPPORT BELT? A pregnancy support belt provides support to the lower portion of the belly taking some of the weight of the growing uterus and distributing to the other parts of your body. It is designed make you comfortable and gives you extra support. Over the years, the pregnancy apparel market has been studying the needs and wants of pregnant women and they have come up with the most comfortable pregnancy support belts that woman could ever ask for. In fact, you will no longer have to wear a stretched-out or bulky pregnancy belt that is visible underneath your clothes and makes you feel even more uncomfortable. Nowadays, a pregnancy support belt is made of comfortable and stretchy materials that will not irritate your skin but will actually make you feel at ease and you will not even notice you are wearing it. They are easy to put on and adjust during the day and can be worn at night for additional support.  BENEFITS:  Relives Back pain  Relieves Abdominal Muscle and Leg Pain  Stabilizes the Pelvic Ring  Offers a Cushioned Abdominal Lift Pad  Relieves pressure on the Sciatic Nerve Within  Minutes WHERE TO GET YOUR PREGNANCY BELT: [employer] [phone] @2301  [address] [place], [place]

## 2019-11-10 NOTE — MAU Note (Signed)
Low back has started hurting, +HA, seeing spots, denies epigastric pain or increase in swelling. No bleeding, ? LOF "dripps here and there".

## 2019-11-10 NOTE — MAU Provider Note (Addendum)
History     CSN: [id number]  Arrival date and time: 11/10/19 1145   Chief Complaint  Patient presents with  . Headache  . Hypertension   Ms Robyn Cummings is a 24 year old G46 at 29w1dwho presents to the MAU today after being referred from the clinic for pre-eclampsia evaluation. She was seen last night in the MAU for concern for rupture of membranes, but was found to be in false labor with intact membranes. She was referred for a blood pressure check this morning at the office, where she reported that she has been having a frontal headache since this morning with visual changes and dark spots on the sides of her vision.  Past Medical History:  Diagnosis Date  . ADHD (attention deficit hyperactivity disorder)   . Anaphylactic reaction   . Anemia   . Anxiety   . Asthma   . [place]ipolar 1 disorder ([place]   . Chronic back pain   . Dyspnea   . Hypertension   . Infection    UTI  . Migraine   . Seizures ([place]    has not had a seizure since childhood, no medications currently  . Sleep disorder   . Vaginal Pap smear, abnormal     Past Surgical History:  Procedure Laterality Date  . NO PAST SURGERIES      Family History  Problem Relation Age of Onset  . Anxiety disorder Mother   . Arthritis Father   . Asthma Father   . Cancer Maternal Aunt   . Diabetes Maternal Uncle   . Cancer Paternal Uncle   . Diabetes Paternal Grandmother   . Stroke Paternal Grandmother     Social History   Tobacco Use  . Smoking status: Current Some Day Smoker    Packs/day: 0.25    Types: Cigarettes  . Smokeless tobacco: Never Used  Vaping Use  . Vaping Use: Never used  Substance Use Topics  . Alcohol use: No  . Drug use: Not Currently    Types: Marijuana    Comment: last was July 2021    Allergies:  Allergies  Allergen Reactions  . Mushroom Extract Complex Anaphylaxis    Anything with mushrooms  . Lime [Lime, Sulfurated] Other (See Comments)    Skin rash   . Spinach Nausea And Vomiting     "can't have cooked spinach"    Medications Prior to Admission  Medication Sig Dispense Refill Last Dose  . albuterol (VENTOLIN HFA) 108 (90 Base) MCG/ACT inhaler Inhale 1-2 puffs into the lungs every 6 (six) hours as needed for wheezing or shortness of breath. (Patient not taking: Reported on 05/08/2019) 6.7 g 0   . Blood Pressure Monitoring (BLOOD PRESSURE KIT) DEVI 1 kit by Does not apply route once a week. Check Blood Pressure regularly and record readings into the [employer] App.  Large Cuff.  DX O90.0 1 each 0   . cyclobenzaprine (FLEXERIL) 10 MG tablet Take 1 tablet (10 mg total) by mouth every 8 (eight) hours as needed for muscle spasms. 30 tablet 1   . Elastic Bandages & Supports (COMFORT FIT MATERNITY SUPP LG) MISC 1 Units by Does not apply route daily. (Patient not taking: Reported on 11/10/2019) 1 each 0   . famotidine (PEPCID) 40 MG tablet Take 1 tablet (40 mg total) by mouth daily. (Patient not taking: Reported on 11/06/2019) 30 tablet 1   . ferrous sulfate (FERROUSUL) 325 (65 FE) MG tablet Take 1 tablet (325 mg total) by mouth every other day.  30 tablet 2   . Magnesium Oxide (MAG-OXIDE) 200 MG TABS Take 2 tablets (400 mg total) by mouth at bedtime. If that amount causes loose stools in the am, switch to 257m daily at bedtime. (Patient not taking: Reported on 10/21/2019) 60 tablet 3   . Prenat-Fe Poly-Methfol-FA-DHA (VITAFOL FE+) 90-0.6-0.4-200 MG CAPS Take 1 capsule by mouth at bedtime.  (Patient not taking: Reported on 11/06/2019)       Review of Systems  Eyes: Positive for photophobia and visual disturbance.  Respiratory: Negative for shortness of breath.   Cardiovascular: Positive for chest pain (Left lower chest wall).  Gastrointestinal: Negative for nausea and vomiting.  Genitourinary: Positive for decreased urine volume. Negative for vaginal bleeding and vaginal discharge.  Musculoskeletal: Positive for back pain.  Neurological: Positive for headaches. Negative for numbness.    Physical Exam   Blood pressure 119/86, pulse 94, height _0  (1.626 m), weight 101.2 kg, last menstrual period 02/23/2019, SpO2 98 %. Patient Vitals for the past 24 hrs:  BP Pulse SpO2 Height Weight  11/10/19 1642 131/70 89 -- -- --  11/10/19 1401 127/71 75 -- -- --  11/10/19 1346 124/69 89 -- -- --  11/10/19 1331 126/76 77 -- -- --  11/10/19 1316 119/86 94 98 % -- --  11/10/19 1300 (!) 98/59 (!) 106 -- -- --  11/10/19 1258 (!) 94/53 (!) 103 -- -- --  11/10/19 1249 104/60 81 -- -- --  11/10/19 1239 -- -- -- _1  (1.626 m) 101.2 kg   Physical Exam GENERAL: Well-developed, well-nourished female in no acute distress.  Laying in bed with bed sheets pulled over her and physically uncomfortable HEAD: Normocephalic, atraumatic.  CHEST: Normal effort of breathing, regular heart rate ABDOMEN: Soft, nontender, gravid EXTREMITY : no lower leg edema. EFM: 120 bpm, mod variability, + accels, no decels Toco: none  Results for orders placed or performed during the hospital encounter of 11/10/19 (from the past 24 hour(s))  CBC with Differential/Platelet     Status: Abnormal   Collection Time: 11/10/19  1:19 PM  Result Value Ref Range   WBC 11.8 (H) 4.0 - 10.5 K/uL   RBC 3.76 (L) 3.87 - 5.11 MIL/uL   Hemoglobin 10.2 (L) 12.0 - 15.0 g/dL   HCT 32.1 (L) 36 - 46 %   MCV 85.4 80.0 - 100.0 fL   MCH 27.1 26.0 - 34.0 pg   MCHC 31.8 30.0 - 36.0 g/dL   RDW 13.9 11.5 - 15.5 %   Platelets 353 150 - 400 K/uL   nRBC 0.0 0.0 - 0.2 %   Neutrophils Relative % 74 %   Neutro Abs 8.7 (H) 1.7 - 7.7 K/uL   Lymphocytes Relative 18 %   Lymphs Abs 2.1 0.7 - 4.0 K/uL   Monocytes Relative 6 %   Monocytes Absolute 0.7 0.1 - 1.0 K/uL   Eosinophils Relative 1 %   Eosinophils Absolute 0.1 0.0 - 0.5 K/uL   Basophils Relative 0 %   Basophils Absolute 0.0 0.0 - 0.1 K/uL   Immature Granulocytes 1 %   Abs Immature Granulocytes 0.07 0.00 - 0.07 K/uL  Comprehensive metabolic panel     Status: Abnormal    Collection Time: 11/10/19  1:19 PM  Result Value Ref Range   Sodium 136 135 - 145 mmol/L   Potassium 4.1 3.5 - 5.1 mmol/L   Chloride 106 98 - 111 mmol/L   CO2 20 (L) 22 - 32 mmol/L   Glucose, Bld 88 70 -  99 mg/dL   BUN 8 6 - 20 mg/dL   Creatinine, Ser 0.47 0.44 - 1.00 mg/dL   Calcium 8.9 8.9 - 10.3 mg/dL   Total Protein 6.4 (L) 6.5 - 8.1 g/dL   Albumin 2.3 (L) 3.5 - 5.0 g/dL   AST 14 (L) 15 - 41 U/L   ALT 19 0 - 44 U/L   Alkaline Phosphatase 245 (H) 38 - 126 U/L   Total Bilirubin 0.4 0.3 - 1.2 mg/dL   GFR, Estimated >60 >60 mL/min   Anion gap 10 5 - 15  Urinalysis, Routine w reflex microscopic     Status: Abnormal   Collection Time: 11/10/19  1:30 PM  Result Value Ref Range   Color, Urine YELLOW YELLOW   APPearance HAZY (A) CLEAR   Specific Gravity, Urine 1.013 1.005 - 1.030   pH 6.0 5.0 - 8.0   Glucose, UA NEGATIVE NEGATIVE mg/dL   Hgb urine dipstick NEGATIVE NEGATIVE   Bilirubin Urine NEGATIVE NEGATIVE   Ketones, ur NEGATIVE NEGATIVE mg/dL   Protein, ur NEGATIVE NEGATIVE mg/dL   Nitrite NEGATIVE NEGATIVE   Leukocytes,Ua NEGATIVE NEGATIVE  Protein / creatinine ratio, urine     Status: None   Collection Time: 11/10/19  1:30 PM  Result Value Ref Range   Creatinine, Urine 120.52 mg/dL   Total Protein, Urine 7 mg/dL   Protein Creatinine Ratio 0.06 0.00 - 0.15 mg/mg[Cre]   MAU Course  Procedures  MDM This is a 24 year old female with an apparent history of chronic hypertension review who is not required antihypertensives during this pregnancy.  She was seen in the MAU last night and discharged with false labor with BP follow-up in the clinic today.  There she had presented with symptoms of headaches and visual changes and so was referred back to the MAU for further preeclampsia evaluation.  Upon discussing with her, it is evident that these headaches and visual changes are familiar to her as she describes a long history of having chronic headaches with spots in vision that are  poorly treated with medicines and can last anywhere from hours to several days. she says she had a headache on Saturday which resolved after sleeping and her headache today started shortly after waking up.  Given this, her symptoms seem to be consistent with chronic migraines which are poorly controlled.  Also, she complains of constant back pain and left lower chest wall pain which appears to be consistent with round ligament pain and musculoskeletal pain, given she does not have any diaphoresis, SOB, or dyspnea.  She is not having any contractions on tocometry and has a reactive NST. Repeat pre-eclampsia labs were normal with a urine protein creatinine ratio of 0.6. While here her blood pressure has been normal without the use of any antihypertensives. Additionally, headache was improved with IV Benadryl, steroids, fluid resuscitation and sleep.  Assessment and Plan  SIUP at 21w1dwith chronic headaches. Vital signs are reassuring against pre-clampsia with normal labs and no change since yesterday.  [facility]- message sent PEC precautions Tylenol prn HA Maternity support belt prn comfort  RBaldo Ash11/09/2019, 1:43 PM    CNM attestation:  HPI: Robyn Applebyis a 24y.o. G4P0030 at 393w1dent from office with HA and elevated BP  ROS, labs, PMH reviewed + FM, no LOF no VB no ctx +HA No CP, SOB, RUQ pain  PE: Patient Vitals for the past 24 hrs:  BP Pulse SpO2 Height Weight  11/10/19 1642 131/70 89 -- -- --  11/10/19 1401 127/71 75 -- -- --  11/10/19 1346 124/69 89 -- -- --  11/10/19 1331 126/76 77 -- -- --  11/10/19 1316 119/86 94 98 % -- --  11/10/19 1300 (!) 98/59 (!) 106 -- -- --  11/10/19 1258 (!) 94/53 (!) 103 -- -- --  11/10/19 1249 104/60 81 -- -- --  11/10/19 1239 -- -- -- _0  (1.626 m) 101.2 kg   Gen: A&O x3, NAD Resp: normal effort and rate, no distress Heart: regular rate Abd: Soft, NT Pelvic:  deferred  EFM:  FHR 120, mod variability, + accels, no decels TOCO: none  Orders Placed This Encounter  Procedures  . Urinalysis, Routine w reflex microscopic Urine, Clean Catch  . CBC with Differential/Platelet  . Comprehensive metabolic panel  . Protein / creatinine ratio, urine  . Discharge patient   Meds ordered this encounter  Medications  . metoCLOPramide (REGLAN) injection 10 mg  . dexamethasone (DECADRON) injection 10 mg  . diphenhydrAMINE (BENADRYL) injection 25 mg  . lactated ringers bolus 1,000 mL  . Elastic Bandages & Supports (COMFORT FIT MATERNITY SUPP LG) MISC    Sig: 1 Units by Does not apply route daily.    Dispense:  1 each    Refill:  0    Order Specific Question:   Supervising Provider    Answer:   Rip Harbour, MICHAEL L [[id number]]   MDM Labs ordered and reviewed. Normotensive since arrival to MAU. No signs of PEC. HA improved. Stable for discharge home.   Assessment: 1. [redacted] weeks gestation of pregnancy   2. NST (non-stress test) reactive   3. Chronic hypertension affecting pregnancy   4. Chronic nonintractable headache, unspecified headache type   5. Round ligament pain   6. Chronic hypertension   7. Pelvic pressure in pregnancy, antepartum, third trimester    Plan: - Discharge home  - PEC precautions - Follow-up as scheduled in 2 days for BP check- message sent - Return to maternity admissions if symptoms worsen  I confirm that I have verified the information documented in the resident's note. I personally was present during the history, physical exam, and medical decision-making activities of this service and have verified that the service and findings are accurately documented in the resident's note.  Julianne Handler, CNM 11/10/2019 5:46 PM

## 2019-11-10 NOTE — Progress Notes (Signed)
   PRENATAL VISIT NOTE  Subjective:  Robyn Cummings is a 24 y.o. G4P0030 at [redacted]w[redacted]d being seen today for ongoing prenatal care.  She is currently monitored for the following issues for this high-risk pregnancy and has Posttraumatic amnesia; Cannabis abuse; Supervision of high risk pregnancy, antepartum; Trichomonas infection; Polyhydramnios affecting pregnancy; Marginal insertion of umbilical cord affecting management of mother in third trimester; and Chronic hypertension on their problem list.  Patient reports headaches, with visual flutters since last night.  .  Contractions: Not present. Vag. Bleeding: None.  Movement: Present. Denies leaking of fluid.   Patient denies any CP, SOB, or RUQ abdominal pain.    The following portions of the patient's history were reviewed and updated as appropriate: allergies, current medications, past family history, past medical history, past social history, past surgical history and problem list.   Objective:   Vitals:   11/10/19 0945  BP: 124/81  Pulse: 96  Weight: 223 lb 3.2 oz (101.2 kg)    Fetal Status: Fetal Heart Rate (bpm): 126   Movement: Present     General:  Alert, oriented and cooperative. Patient is in no acute distress.  Skin: Skin is warm and dry. No rash noted.   Cardiovascular: Normal heart rate noted  Respiratory: Normal respiratory effort, no problems with respiration noted  Abdomen: Soft, gravid, appropriate for gestational age. Non-tender. Pain/Pressure: Present     Pelvic: Cervical exam performed in the presence of a chaperone        Extremities: Normal range of motion.  Edema: Trace  DTRs - 2+  Mental Status: Normal mood and affect. Normal behavior. Normal judgment and thought content.   Assessment and Plan:  Pregnancy: G4P0030 at [redacted]w[redacted]d 1. Polyhydramnios affecting pregnancy   2. Marginal insertion of umbilical cord affecting management of mother in third trimester   3. Chronic hypertension - Now with headaches and  visual changes. - To L/D triage for further workup.  Patient may need induction of labor.   4. Supervision of high risk pregnancy, antepartum   Term labor symptoms and general obstetric precautions including but not limited to vaginal bleeding, contractions, leaking of fluid and fetal movement were reviewed in detail with the patient. Please refer to After Visit Summary for other counseling recommendations.   No follow-ups on file.  Future Appointments  Date Time Provider Department Center  11/10/2019  3:15 PM Johnny Bridge, MD [facility] None  11/18/2019  2:40 PM Sharyon Cable, CNM [facility] None    Johnny Bridge, MD

## 2019-11-10 NOTE — MAU Note (Signed)
Sent from clinic for further eval, possible induction today.

## 2019-11-12 ENCOUNTER — Ambulatory Visit (INDEPENDENT_AMBULATORY_CARE_PROVIDER_SITE_OTHER): Payer: Medicaid Other

## 2019-11-12 ENCOUNTER — Other Ambulatory Visit: Payer: Self-pay

## 2019-11-12 DIAGNOSIS — O0990 Supervision of high risk pregnancy, unspecified, unspecified trimester: Secondary | ICD-10-CM

## 2019-11-12 DIAGNOSIS — I10 Essential (primary) hypertension: Secondary | ICD-10-CM

## 2019-11-12 DIAGNOSIS — Z3A36 36 weeks gestation of pregnancy: Secondary | ICD-10-CM

## 2019-11-12 NOTE — Progress Notes (Addendum)
Subjective:  Robyn Cummings is a 24 y.o. female here for BP check.   Hypertension ROS: no TIA's, no chest pain on exertion, no dyspnea on exertion and no swelling of ankles.    Objective:  BP 113/81   Pulse 91   Wt 231 lb (104.8 kg)   LMP 02/23/2019   BMI 39.65 kg/m   Appearance alert, well appearing, and in mild to moderate distress.  General exam BP noted to be well controlled today in office.    Assessment:   Blood Pressure well controlled. Headache 8-9/10 not taking medication.  Plan:  Current treatment plan is effective, no change in therapy. per Dr. Debroah Loop.  Keep OB appt. Preeclampsia and labor precautions discussed with patient.

## 2019-11-18 ENCOUNTER — Ambulatory Visit (INDEPENDENT_AMBULATORY_CARE_PROVIDER_SITE_OTHER): Payer: Medicaid Other | Admitting: Certified Nurse Midwife

## 2019-11-18 ENCOUNTER — Other Ambulatory Visit: Payer: Self-pay | Admitting: Advanced Practice Midwife

## 2019-11-18 ENCOUNTER — Other Ambulatory Visit: Payer: Self-pay

## 2019-11-18 ENCOUNTER — Encounter: Payer: Self-pay | Admitting: Certified Nurse Midwife

## 2019-11-18 VITALS — BP 126/93 | HR 108 | Wt 228.0 lb

## 2019-11-18 DIAGNOSIS — O409XX0 Polyhydramnios, unspecified trimester, not applicable or unspecified: Secondary | ICD-10-CM

## 2019-11-18 DIAGNOSIS — I10 Essential (primary) hypertension: Secondary | ICD-10-CM

## 2019-11-18 DIAGNOSIS — O0990 Supervision of high risk pregnancy, unspecified, unspecified trimester: Secondary | ICD-10-CM

## 2019-11-18 DIAGNOSIS — Z3A38 38 weeks gestation of pregnancy: Secondary | ICD-10-CM

## 2019-11-18 NOTE — Progress Notes (Signed)
Pt is having increase in pressure and ctx today.  Pt would like to have cervix check.

## 2019-11-18 NOTE — Progress Notes (Signed)
   PRENATAL VISIT NOTE  Subjective:  Robyn Cummings is a 24 y.o. G4P0030 at [redacted]w[redacted]d being seen today for ongoing prenatal care.  She is currently monitored for the following issues for this high-risk pregnancy and has Posttraumatic amnesia; Cannabis abuse; Supervision of high risk pregnancy, antepartum; Trichomonas infection; Marginal insertion of umbilical cord affecting management of mother in third trimester; and Chronic hypertension on their problem list.  Patient reports occasional contractions.  Contractions: Irregular. Vag. Bleeding: None.  Movement: Present. Denies leaking of fluid.   The following portions of the patient's history were reviewed and updated as appropriate: allergies, current medications, past family history, past medical history, past social history, past surgical history and problem list.   Objective:   Vitals:   11/18/19 1416  BP: (!) 126/93  Pulse: (!) 108  Weight: 228 lb (103.4 kg)    Fetal Status: Fetal Heart Rate (bpm): 160 Fundal Height: 37 cm Movement: Present  Presentation: Vertex  General:  Alert, oriented and cooperative. Patient is in no acute distress.  Skin: Skin is warm and dry. No rash noted.   Cardiovascular: Normal heart rate noted  Respiratory: Normal respiratory effort, no problems with respiration noted  Abdomen: Soft, gravid, appropriate for gestational age.  Pain/Pressure: Present     Pelvic: Cervical exam performed in the presence of a chaperone Dilation: Fingertip Effacement (%): Thick Station: -3  Extremities: Normal range of motion.  Edema: Trace  Mental Status: Normal mood and affect. Normal behavior. Normal judgment and thought content.   Assessment and Plan:  Pregnancy: G4P0030 at [redacted]w[redacted]d 1. Supervision of high risk pregnancy, antepartum - Patient doing okay, not coping well through contractions at this time, reports occasional contractions every 5 minutes for the last 20-30 minutes. Mild contractions by palpation  - Anticipatory  guidance on upcoming appointment  - according to recommendations for [facility] delivery between 39-39/6. Given patient option of dates based on induction schedule, patient request 11/22 for IOL.   2. Polyhydramnios affecting pregnancy - resolved based on [place] on 11/5  3. Chronic hypertension - IOL scheduled for 11/22 at [facility], orders placed  Term labor symptoms and general obstetric precautions including but not limited to vaginal bleeding, contractions, leaking of fluid and fetal movement were reviewed in detail with the patient. Please refer to After Visit Summary for other counseling recommendations.    Future Appointments  Date Time Provider Department Center  11/23/2019 12:00 AM [facility] SCHED ROOM [facility] None    Sharyon Cable, CNM

## 2019-11-18 NOTE — Patient Instructions (Signed)
New Induction of Labor Process for [facility]  In Fall 2020 [facility] changed it's process for scheduling inductions of labor to create more induction slots and to make sure patients get COVID-19 testing in advance. After you have been tested you need to quarantine so that you do not get infected after your test. You should not go anywhere after your test except necessary medical appointments.  You have been scheduled for induction of labor on 11/22 at 12:00 am (midnight).  Please arrive on 11/21 at 11:45 pm at Entrance C, Maternity Assessment Unit (MAU) security desk unless you are called to be placed on hold. You may eat a light meal before coming to the hospital. Thank you,  Center for [facility]

## 2019-11-19 ENCOUNTER — Telehealth (HOSPITAL_COMMUNITY): Payer: Self-pay | Admitting: *Deleted

## 2019-11-19 NOTE — Telephone Encounter (Signed)
Preadmission screen  

## 2019-11-21 ENCOUNTER — Other Ambulatory Visit: Payer: Self-pay

## 2019-11-21 ENCOUNTER — Encounter (HOSPITAL_COMMUNITY): Payer: Self-pay | Admitting: Obstetrics & Gynecology

## 2019-11-21 ENCOUNTER — Other Ambulatory Visit (HOSPITAL_COMMUNITY): Admission: RE | Admit: 2019-11-21 | Payer: Medicaid Other | Source: Ambulatory Visit

## 2019-11-21 ENCOUNTER — Inpatient Hospital Stay (EMERGENCY_DEPARTMENT_HOSPITAL)
Admission: AD | Admit: 2019-11-21 | Discharge: 2019-11-22 | Disposition: A | Discharge status: Home / Self Care | Payer: Medicaid Other | Source: Home / Self Care | Attending: Obstetrics & Gynecology | Admitting: Obstetrics & Gynecology

## 2019-11-21 DIAGNOSIS — M542 Cervicalgia: Secondary | ICD-10-CM | POA: Insufficient documentation

## 2019-11-21 DIAGNOSIS — O43193 Other malformation of placenta, third trimester: Secondary | ICD-10-CM

## 2019-11-21 DIAGNOSIS — R102 Pelvic and perineal pain: Secondary | ICD-10-CM | POA: Insufficient documentation

## 2019-11-21 DIAGNOSIS — N898 Other specified noninflammatory disorders of vagina: Secondary | ICD-10-CM | POA: Diagnosis not present

## 2019-11-21 DIAGNOSIS — F17210 Nicotine dependence, cigarettes, uncomplicated: Secondary | ICD-10-CM | POA: Insufficient documentation

## 2019-11-21 DIAGNOSIS — O26893 Other specified pregnancy related conditions, third trimester: Secondary | ICD-10-CM | POA: Insufficient documentation

## 2019-11-21 DIAGNOSIS — R42 Dizziness and giddiness: Secondary | ICD-10-CM | POA: Insufficient documentation

## 2019-11-21 DIAGNOSIS — Z3A38 38 weeks gestation of pregnancy: Secondary | ICD-10-CM | POA: Diagnosis not present

## 2019-11-21 DIAGNOSIS — I10 Essential (primary) hypertension: Secondary | ICD-10-CM

## 2019-11-21 DIAGNOSIS — O23593 Infection of other part of genital tract in pregnancy, third trimester: Secondary | ICD-10-CM | POA: Insufficient documentation

## 2019-11-21 DIAGNOSIS — Z87440 Personal history of urinary (tract) infections: Secondary | ICD-10-CM | POA: Insufficient documentation

## 2019-11-21 DIAGNOSIS — Z3689 Encounter for other specified antenatal screening: Secondary | ICD-10-CM

## 2019-11-21 DIAGNOSIS — O99333 Smoking (tobacco) complicating pregnancy, third trimester: Secondary | ICD-10-CM | POA: Insufficient documentation

## 2019-11-21 DIAGNOSIS — O10913 Unspecified pre-existing hypertension complicating pregnancy, third trimester: Secondary | ICD-10-CM | POA: Insufficient documentation

## 2019-11-21 LAB — WET PREP, GENITAL
Clue Cells Wet Prep HPF POC: NONE SEEN
Sperm: NONE SEEN
Trich, Wet Prep: NONE SEEN
WBC, Wet Prep HPF POC: NONE SEEN
Yeast Wet Prep HPF POC: NONE SEEN

## 2019-11-21 LAB — CBC
HCT: 30.2 % — ABNORMAL LOW (ref 36.0–46.0)
Hemoglobin: 9.8 g/dL — ABNORMAL LOW (ref 12.0–15.0)
MCH: 27 pg (ref 26.0–34.0)
MCHC: 32.5 g/dL (ref 30.0–36.0)
MCV: 83.2 fL (ref 80.0–100.0)
Platelets: 348 10*3/uL (ref 150–400)
RBC: 3.63 MIL/uL — ABNORMAL LOW (ref 3.87–5.11)
RDW: 13.9 % (ref 11.5–15.5)
WBC: 11.4 10*3/uL — ABNORMAL HIGH (ref 4.0–10.5)
nRBC: 0 % (ref 0.0–0.2)

## 2019-11-21 LAB — COMPREHENSIVE METABOLIC PANEL
ALT: 19 U/L (ref 0–44)
AST: 11 U/L — ABNORMAL LOW (ref 15–41)
Albumin: 2.1 g/dL — ABNORMAL LOW (ref 3.5–5.0)
Alkaline Phosphatase: 230 U/L — ABNORMAL HIGH (ref 38–126)
Anion gap: 8 (ref 5–15)
BUN: 8 mg/dL (ref 6–20)
CO2: 19 mmol/L — ABNORMAL LOW (ref 22–32)
Calcium: 8.7 mg/dL — ABNORMAL LOW (ref 8.9–10.3)
Chloride: 109 mmol/L (ref 98–111)
Creatinine, Ser: 0.6 mg/dL (ref 0.44–1.00)
GFR, Estimated: >60 mL/min (ref >60)
Glucose, Bld: 95 mg/dL (ref 70–99)
Potassium: 3.8 mmol/L (ref 3.5–5.1)
Sodium: 136 mmol/L (ref 135–145)
Total Bilirubin: 0.4 mg/dL (ref 0.3–1.2)
Total Protein: 6.2 g/dL — ABNORMAL LOW (ref 6.5–8.1)

## 2019-11-21 LAB — PROTEIN / CREATININE RATIO, URINE
Creatinine, Urine: 52.73 mg/dL
Total Protein, Urine: <6 mg/dL

## 2019-11-21 MED ORDER — ACETAMINOPHEN 500 MG PO TABS
1000.0000 mg | ORAL_TABLET | Freq: Once | ORAL | Status: AC
  Administered 2019-11-21: 1000 mg via ORAL
  Filled 2019-11-21: qty 2

## 2019-11-21 NOTE — MAU Provider Note (Signed)
History     CSN: [id number]  Arrival date and time: 11/21/19 2159   First Provider Initiated Contact with Patient 11/21/19 2235      Chief Complaint  Patient presents with  . Dizziness  . Hypertension   Robyn Cummings is a 24 y.o. G4P0030 at 71w5dwho receives care at [facility].  She presents today for Dizziness and Hypertension .  Patient father, via facetime, reports that she was experiencing some back and neck pain as well as some lower pelvic pressure about 2 hours ago.  He advised her to take her blood pressure, "because it has been an ongoing issue during the pregnancy," and it was elevated.  Patient endorses continued pain and rates it a 9/10.  Patient endorses fetal movement and denies abdominal cramping.  Patient does report vaginal discharge with a smell, but is unable to describe the smell.  Patient denies vaginal bleeding.  Patient with speech difficulty and uses phone/text to communicate with provider.    OB History    Gravida  4   Para  0   Term  0   Preterm  0   AB  3   Living  0     SAB  3   TAB  0   Ectopic  0   Multiple      Live Births              Past Medical History:  Diagnosis Date  . ADHD (attention deficit hyperactivity disorder)   . Anaphylactic reaction   . Anemia   . Anxiety   . Asthma   . Bipolar 1 disorder ([place]   . Chronic back pain   . Dyspnea   . Hypertension   . Infection    UTI  . Migraine   . Polyhydramnios affecting pregnancy 09/15/2019   Followed by MFM  . [place]    has not had a seizure since childhood, no medications currently  . Sleep disorder   . Vaginal Pap smear, abnormal     Past Surgical History:  Procedure Laterality Date  . NO PAST SURGERIES      Family History  Problem Relation Age of Onset  . Anxiety disorder Mother   . Arthritis Father   . Asthma Father   . Cancer Maternal Aunt   . Diabetes Maternal Uncle   . Cancer Paternal Uncle   . Diabetes Paternal Grandmother   .  Stroke Paternal Grandmother     Social History   Tobacco Use  . Smoking status: Current Some Day Smoker    Packs/day: 0.25    Types: Cigarettes  . Smokeless tobacco: Never Used  Vaping Use  . Vaping Use: Never used  Substance Use Topics  . Alcohol use: No  . Drug use: Not Currently    Types: Marijuana    Comment: last was July 2021    Allergies:  Allergies  Allergen Reactions  . Mushroom Extract Complex Anaphylaxis    Anything with mushrooms  . Lime [Lime, Sulfurated] Other (See Comments)    Skin rash   . Spinach Nausea And Vomiting    "can't have cooked spinach"    Medications Prior to Admission  Medication Sig Dispense Refill Last Dose  . cyclobenzaprine (FLEXERIL) 10 MG tablet Take 1 tablet (10 mg total) by mouth every 8 (eight) hours as needed for muscle spasms. 30 tablet 1 Past Week at Unknown time  . ferrous sulfate (FERROUSUL) 325 (65 FE) MG tablet Take 1 tablet (325 mg  total) by mouth every other day. 30 tablet 2 Past Week at Unknown time  . albuterol (VENTOLIN HFA) 108 (90 Base) MCG/ACT inhaler Inhale 1-2 puffs into the lungs every 6 (six) hours as needed for wheezing or shortness of breath. (Patient not taking: Reported on 05/08/2019) 6.7 g 0   . Blood Pressure Monitoring (BLOOD PRESSURE KIT) DEVI 1 kit by Does not apply route once a week. Check Blood Pressure regularly and record readings into the [employer] App.  Large Cuff.  DX O90.0 1 each 0   . Elastic Bandages & Supports (COMFORT FIT MATERNITY SUPP LG) MISC 1 Units by Does not apply route daily. 1 each 0   . Magnesium Oxide (MAG-OXIDE) 200 MG TABS Take 2 tablets (400 mg total) by mouth at bedtime. If that amount causes loose stools in the am, switch to 264m daily at bedtime. (Patient not taking: Reported on 10/21/2019) 60 tablet 3   . Prenat-Fe Poly-Methfol-FA-DHA (VITAFOL FE+) 90-0.6-0.4-200 MG CAPS Take 1 capsule by mouth at bedtime.  (Patient not taking: Reported on 11/06/2019)       Review of Systems    Constitutional: Negative for chills and fever.  Eyes: Negative for visual disturbance.  Respiratory: Negative for cough, chest tightness and shortness of breath.   Cardiovascular: Negative for chest pain.  Genitourinary: Positive for vaginal discharge (White, with odor). Negative for difficulty urinating, dysuria and vaginal bleeding.  Neurological: Positive for dizziness (None currently). Negative for light-headedness and headaches.   Physical Exam   Last menstrual period 02/23/2019.  Physical Exam Constitutional:      Appearance: Normal appearance. She is obese.  HENT:     Head: Normocephalic and atraumatic.  Eyes:     Conjunctiva/sclera: Conjunctivae normal.  Cardiovascular:     Rate and Rhythm: Regular rhythm.     Heart sounds: Normal heart sounds.  Pulmonary:     Effort: Pulmonary effort is normal. No respiratory distress.     Breath sounds: Normal breath sounds.  Abdominal:     Tenderness: There is no abdominal tenderness.  Genitourinary:    Labia:        Right: No tenderness.        Left: No tenderness.      Comments: Wet prep collected by blind swab. Thin white discharge noted on labial.  Musculoskeletal:        General: Normal range of motion.     Cervical back: Normal range of motion.  Skin:    General: Skin is warm and dry.     Findings: Bruising present.          Comments: Left arm s/t previous IV  Neurological:     Mental Status: She is alert and oriented to person, place, and time.  Psychiatric:        Mood and Affect: Mood normal.     Fetal Assessment 135 bpm, Mod Var, -Decels, +Accels Toco: Irregular  MAU Course   Results for orders placed or performed during the hospital encounter of 11/21/19 (from the past 24 hour(s))  Protein / creatinine ratio, urine     Status: None   Collection Time: 11/21/19 10:23 PM  Result Value Ref Range   Creatinine, Urine 52.73 mg/dL   Total Protein, Urine <6 mg/dL   Protein Creatinine Ratio        0.00 - 0.15  mg/mg[Cre]  CBC     Status: Abnormal   Collection Time: 11/21/19 10:27 PM  Result Value Ref Range   WBC 11.4 (H)  4.0 - 10.5 K/uL   RBC 3.63 (L) 3.87 - 5.11 MIL/uL   Hemoglobin 9.8 (L) 12.0 - 15.0 g/dL   HCT 30.2 (L) 36 - 46 %   MCV 83.2 80.0 - 100.0 fL   MCH 27.0 26.0 - 34.0 pg   MCHC 32.5 30.0 - 36.0 g/dL   RDW 13.9 11.5 - 15.5 %   Platelets 348 150 - 400 K/uL   nRBC 0.0 0.0 - 0.2 %  Comprehensive metabolic panel     Status: Abnormal   Collection Time: 11/21/19 10:27 PM  Result Value Ref Range   Sodium 136 135 - 145 mmol/L   Potassium 3.8 3.5 - 5.1 mmol/L   Chloride 109 98 - 111 mmol/L   CO2 19 (L) 22 - 32 mmol/L   Glucose, Bld 95 70 - 99 mg/dL   BUN 8 6 - 20 mg/dL   Creatinine, Ser 0.60 0.44 - 1.00 mg/dL   Calcium 8.7 (L) 8.9 - 10.3 mg/dL   Total Protein 6.2 (L) 6.5 - 8.1 g/dL   Albumin 2.1 (L) 3.5 - 5.0 g/dL   AST 11 (L) 15 - 41 U/L   ALT 19 0 - 44 U/L   Alkaline Phosphatase 230 (H) 38 - 126 U/L   Total Bilirubin 0.4 0.3 - 1.2 mg/dL   GFR, Estimated >60 >60 mL/min   Anion gap 8 5 - 15  Wet prep, genital     Status: None   Collection Time: 11/21/19 11:00 PM  Result Value Ref Range   Yeast Wet Prep HPF POC NONE SEEN NONE SEEN   Trich, Wet Prep NONE SEEN NONE SEEN   Clue Cells Wet Prep HPF POC NONE SEEN NONE SEEN   WBC, Wet Prep HPF POC NONE SEEN NONE SEEN   Sperm NONE SEEN    No results found.  MDM Physical Exam Labs: CBC, CMP, PC Ratio, Wet prep Measure BPQ15 min EFM Pain Management Heating Pad Assessment and Plan  24 year old G4P0030  SIUP at 47.6weeks Cat I FT CHTN Vaginal Discharge and Odor  -POC reviewed -Exam performed and findings discussed. -Offered and accepts pain medication. -Suggested heating pad to affected area and patient agreeable. -Labs ordered and collected. -Will await results and reassess.   Maryann Conners MSN, CNM 11/21/2019, 10:35 PM   Reassessment (11:43 PM)  -Patient expresses desire for discharge.  -Labs return  normal. -Patient updated on findings. -Labor precautions given. -Instructed to return tomorrow at 1145pm for IOL. -Patient verbalizes understanding and without questions or concerns. -Encouraged to call or return to MAU if symptoms worsen or with the onset of new symptoms. -Discharged to home in stable condition.  Maryann Conners MSN, CNM Advanced Practice Provider, Center for [employer] [facility]

## 2019-11-21 NOTE — Discharge Instructions (Signed)

## 2019-11-21 NOTE — MAU Note (Signed)
...  Robyn Cummings is a 24 y.o. at [redacted]w[redacted]d here in MAU reporting: two hours ago she began feeling dizzy and began experiencing lower abdominal pain that feels like pressure and cramping. +FM. She reports she took her blood pressure after calling her dad and it was 179/117. She reports she has not felt the pain since she was on the ambulance and did not feel the CTX at 2210.  Pain: 8/10 lower abdominal

## 2019-11-23 ENCOUNTER — Other Ambulatory Visit: Payer: Self-pay

## 2019-11-23 ENCOUNTER — Inpatient Hospital Stay (HOSPITAL_COMMUNITY): Payer: Medicaid Other | Admitting: Anesthesiology

## 2019-11-23 ENCOUNTER — Encounter (HOSPITAL_COMMUNITY): Payer: Self-pay | Admitting: Student

## 2019-11-23 ENCOUNTER — Inpatient Hospital Stay (HOSPITAL_COMMUNITY): Payer: Medicaid Other

## 2019-11-23 ENCOUNTER — Inpatient Hospital Stay (HOSPITAL_COMMUNITY)
Admission: AD | Admit: 2019-11-23 | Discharge: 2019-11-26 | DRG: 805 | Disposition: A | Discharge status: Home / Self Care | Payer: Medicaid Other | Attending: Obstetrics and Gynecology | Admitting: Obstetrics and Gynecology

## 2019-11-23 DIAGNOSIS — I10 Essential (primary) hypertension: Secondary | ICD-10-CM | POA: Diagnosis present

## 2019-11-23 DIAGNOSIS — O43123 Velamentous insertion of umbilical cord, third trimester: Secondary | ICD-10-CM | POA: Diagnosis present

## 2019-11-23 DIAGNOSIS — D509 Iron deficiency anemia, unspecified: Secondary | ICD-10-CM | POA: Diagnosis present

## 2019-11-23 DIAGNOSIS — F952 Tourette's disorder: Secondary | ICD-10-CM | POA: Diagnosis present

## 2019-11-23 DIAGNOSIS — O1002 Pre-existing essential hypertension complicating childbirth: Principal | ICD-10-CM | POA: Diagnosis present

## 2019-11-23 DIAGNOSIS — O0990 Supervision of high risk pregnancy, unspecified, unspecified trimester: Secondary | ICD-10-CM

## 2019-11-23 DIAGNOSIS — O411290 Chorioamnionitis, unspecified trimester, not applicable or unspecified: Secondary | ICD-10-CM | POA: Diagnosis not present

## 2019-11-23 DIAGNOSIS — F1210 Cannabis abuse, uncomplicated: Secondary | ICD-10-CM | POA: Diagnosis present

## 2019-11-23 DIAGNOSIS — O99324 Drug use complicating childbirth: Secondary | ICD-10-CM | POA: Diagnosis present

## 2019-11-23 DIAGNOSIS — O43193 Other malformation of placenta, third trimester: Secondary | ICD-10-CM | POA: Diagnosis present

## 2019-11-23 DIAGNOSIS — Z3A39 39 weeks gestation of pregnancy: Secondary | ICD-10-CM

## 2019-11-23 DIAGNOSIS — O9902 Anemia complicating childbirth: Secondary | ICD-10-CM | POA: Diagnosis present

## 2019-11-23 DIAGNOSIS — Z20822 Contact with and (suspected) exposure to covid-19: Secondary | ICD-10-CM | POA: Diagnosis present

## 2019-11-23 DIAGNOSIS — O411230 Chorioamnionitis, third trimester, not applicable or unspecified: Secondary | ICD-10-CM | POA: Diagnosis present

## 2019-11-23 DIAGNOSIS — A599 Trichomoniasis, unspecified: Secondary | ICD-10-CM | POA: Diagnosis present

## 2019-11-23 DIAGNOSIS — O139 Gestational [pregnancy-induced] hypertension without significant proteinuria, unspecified trimester: Secondary | ICD-10-CM | POA: Diagnosis present

## 2019-11-23 DIAGNOSIS — O10919 Unspecified pre-existing hypertension complicating pregnancy, unspecified trimester: Secondary | ICD-10-CM | POA: Diagnosis present

## 2019-11-23 DIAGNOSIS — Z87891 Personal history of nicotine dependence: Secondary | ICD-10-CM | POA: Diagnosis not present

## 2019-11-23 LAB — CBC
HCT: 31.6 % — ABNORMAL LOW (ref 36.0–46.0)
HCT: 30.4 % — ABNORMAL LOW (ref 36.0–46.0)
Hemoglobin: 10.3 g/dL — ABNORMAL LOW (ref 12.0–15.0)
Hemoglobin: 9.8 g/dL — ABNORMAL LOW (ref 12.0–15.0)
MCH: 27.5 pg (ref 26.0–34.0)
MCH: 27.2 pg (ref 26.0–34.0)
MCHC: 32.6 g/dL (ref 30.0–36.0)
MCHC: 32.2 g/dL (ref 30.0–36.0)
MCV: 84.5 fL (ref 80.0–100.0)
MCV: 84.4 fL (ref 80.0–100.0)
Platelets: 376 10*3/uL (ref 150–400)
Platelets: 347 10*3/uL (ref 150–400)
RBC: 3.74 MIL/uL — ABNORMAL LOW (ref 3.87–5.11)
RBC: 3.6 MIL/uL — ABNORMAL LOW (ref 3.87–5.11)
RDW: 14.2 % (ref 11.5–15.5)
RDW: 14.2 % (ref 11.5–15.5)
WBC: 9.9 10*3/uL (ref 4.0–10.5)
WBC: 12.2 10*3/uL — ABNORMAL HIGH (ref 4.0–10.5)
nRBC: 0 % (ref 0.0–0.2)
nRBC: 0 % (ref 0.0–0.2)

## 2019-11-23 LAB — RESPIRATORY PANEL BY RT PCR (FLU A&B, COVID)
Influenza A by PCR: NEGATIVE
Influenza B by PCR: NEGATIVE
SARS Coronavirus 2 by RT PCR: NEGATIVE

## 2019-11-23 LAB — TYPE AND SCREEN
ABO/RH(D): O POS
Antibody Screen: NEGATIVE

## 2019-11-23 LAB — RPR: RPR Ser Ql: NONREACTIVE

## 2019-11-23 MED ORDER — MISOPROSTOL 50MCG HALF TABLET
ORAL_TABLET | ORAL | Status: AC
  Filled 2019-11-23: qty 1

## 2019-11-23 MED ORDER — OXYTOCIN-SODIUM CHLORIDE 30-0.9 UT/500ML-% IV SOLN: 2.5000 [IU]/h | INTRAVENOUS | Status: DC

## 2019-11-23 MED ORDER — TERBUTALINE SULFATE 1 MG/ML IJ SOLN
0.2500 mg | Freq: Once | INTRAMUSCULAR | Status: AC | PRN
  Administered 2019-11-23: 0.25 mg via SUBCUTANEOUS
  Filled 2019-11-23: qty 1

## 2019-11-23 MED ORDER — PHENYLEPHRINE 40 MCG/ML (10ML) SYRINGE FOR IV PUSH (FOR BLOOD PRESSURE SUPPORT): 80.0000 ug | PREFILLED_SYRINGE | INTRAVENOUS | Status: DC | PRN

## 2019-11-23 MED ORDER — EPHEDRINE 5 MG/ML INJ: 10.0000 mg | INTRAVENOUS | Status: DC | PRN

## 2019-11-23 MED ORDER — LACTATED RINGERS IV SOLN
500.0000 mL | INTRAVENOUS | Status: DC | PRN
  Administered 2019-11-24: 1000 mL via INTRAVENOUS

## 2019-11-23 MED ORDER — SODIUM CHLORIDE (PF) 0.9 % IJ SOLN
INTRAMUSCULAR | Status: DC | PRN
  Administered 2019-11-23: 12 mL/h via EPIDURAL

## 2019-11-23 MED ORDER — ONDANSETRON HCL 4 MG/2ML IJ SOLN: 4.0000 mg | Freq: Four times a day (QID) | INTRAMUSCULAR | Status: DC | PRN

## 2019-11-23 MED ORDER — ONDANSETRON HCL 4 MG/2ML IJ SOLN
4.0000 mg | Freq: Four times a day (QID) | INTRAMUSCULAR | Status: DC | PRN
  Filled 2019-11-23: qty 2

## 2019-11-23 MED ORDER — LIDOCAINE HCL (PF) 1 % IJ SOLN: 30.0000 mL | INTRAMUSCULAR | Status: DC | PRN

## 2019-11-23 MED ORDER — OXYTOCIN BOLUS FROM INFUSION: 333.0000 mL | Freq: Once | INTRAVENOUS | Status: DC

## 2019-11-23 MED ORDER — MISOPROSTOL 50MCG HALF TABLET
50.0000 ug | ORAL_TABLET | ORAL | Status: DC | PRN
  Administered 2019-11-23 (×2): 50 ug via BUCCAL
  Filled 2019-11-23: qty 1

## 2019-11-23 MED ORDER — LACTATED RINGERS IV SOLN: INTRAVENOUS | Status: DC

## 2019-11-23 MED ORDER — OXYTOCIN BOLUS FROM INFUSION
333.0000 mL | Freq: Once | INTRAVENOUS | Status: AC
  Administered 2019-11-24: 333 mL via INTRAVENOUS

## 2019-11-23 MED ORDER — TERBUTALINE SULFATE 1 MG/ML IJ SOLN: 0.2500 mg | Freq: Once | INTRAMUSCULAR | Status: DC | PRN

## 2019-11-23 MED ORDER — LACTATED RINGERS IV SOLN: 500.0000 mL | Freq: Once | INTRAVENOUS | Status: DC

## 2019-11-23 MED ORDER — ACETAMINOPHEN 325 MG PO TABS
650.0000 mg | ORAL_TABLET | ORAL | Status: DC | PRN
  Administered 2019-11-24: 650 mg via ORAL
  Filled 2019-11-23: qty 2

## 2019-11-23 MED ORDER — ACETAMINOPHEN 325 MG PO TABS: 650.0000 mg | ORAL_TABLET | ORAL | Status: DC | PRN

## 2019-11-23 MED ORDER — OXYTOCIN-SODIUM CHLORIDE 30-0.9 UT/500ML-% IV SOLN
1.0000 m[IU]/min | INTRAVENOUS | Status: DC
  Administered 2019-11-23: 2 m[IU]/min via INTRAVENOUS
  Filled 2019-11-23: qty 500

## 2019-11-23 MED ORDER — LIDOCAINE HCL (PF) 1 % IJ SOLN
INTRAMUSCULAR | Status: DC | PRN
  Administered 2019-11-23: 7 mL via EPIDURAL

## 2019-11-23 MED ORDER — OXYTOCIN-SODIUM CHLORIDE 30-0.9 UT/500ML-% IV SOLN
2.5000 [IU]/h | INTRAVENOUS | Status: DC
  Filled 2019-11-23: qty 500

## 2019-11-23 MED ORDER — MISOPROSTOL 25 MCG QUARTER TABLET: 25.0000 ug | ORAL_TABLET | ORAL | Status: DC | PRN

## 2019-11-23 MED ORDER — FENTANYL-BUPIVACAINE-NACL 0.5-0.125-0.9 MG/250ML-% EP SOLN
12.0000 mL/h | EPIDURAL | Status: DC | PRN
  Administered 2019-11-24: 12 mL/h via EPIDURAL
  Filled 2019-11-23 (×2): qty 250

## 2019-11-23 MED ORDER — SOD CITRATE-CITRIC ACID 500-334 MG/5ML PO SOLN: 30.0000 mL | ORAL | Status: DC | PRN

## 2019-11-23 MED ORDER — DIPHENHYDRAMINE HCL 50 MG/ML IJ SOLN: 12.5000 mg | INTRAMUSCULAR | Status: DC | PRN

## 2019-11-23 MED ORDER — LACTATED RINGERS IV SOLN: 500.0000 mL | INTRAVENOUS | Status: DC | PRN

## 2019-11-23 MED ORDER — OXYCODONE-ACETAMINOPHEN 5-325 MG PO TABS: 1.0000 | ORAL_TABLET | ORAL | Status: DC | PRN

## 2019-11-23 MED ORDER — OXYCODONE-ACETAMINOPHEN 5-325 MG PO TABS: 2.0000 | ORAL_TABLET | ORAL | Status: DC | PRN

## 2019-11-23 MED ORDER — FENTANYL CITRATE (PF) 100 MCG/2ML IJ SOLN
100.0000 ug | INTRAMUSCULAR | Status: DC | PRN
  Administered 2019-11-23 (×7): 100 ug via INTRAVENOUS
  Filled 2019-11-23 (×7): qty 2

## 2019-11-23 NOTE — Progress Notes (Addendum)
LABOR PROGRESS NOTE  Telia Mangione is a 24 y.o. G4P0030 at [redacted]w[redacted]d  admitted for IOL 2/2 gHTN.  Subjective: Doing well resting in between contractions.   Objective: BP 126/74   Pulse 70   Temp 98.2 F (36.8 C) (Oral)   Resp 18   LMP 02/23/2019   SpO2 99%  or  Vitals:   11/23/19 1120 11/23/19 1555 11/23/19 1751 11/23/19 1800  BP: 139/78 111/63 121/64 126/74  Pulse: 85 90 83 70  Resp: 19 20 17 18   Temp: 97.7 F (36.5 C) 98.2 F (36.8 C)    TempSrc: Oral Oral    SpO2:       Dilation: 3 Effacement (%): Thick Cervical Position: Middle Station: Ballotable Presentation: Vertex Exam by:: [email or web address] Autry-Lott DO FHT: baseline rate 120 bpm, moderate varibility, 15 x 15 acel, no decel Toco: 3-7 mins  Labs: Lab Results  Component Value Date   WBC 9.9 11/23/2019   HGB 10.3 (L) 11/23/2019   HCT 31.6 (L) 11/23/2019   MCV 84.5 11/23/2019   PLT 376 11/23/2019   Patient Active Problem List   Diagnosis Date Noted  . Chronic hypertension during pregnancy, antepartum 11/23/2019  . Gestational hypertension 11/23/2019  . Chronic hypertension 11/09/2019  . Marginal insertion of umbilical cord affecting management of mother in third trimester 09/15/2019  . Trichomonas infection 05/19/2019  . Supervision of high risk pregnancy, antepartum 05/08/2019  . 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 Cannabis abuse 05/09/2015   Assessment / Plan: 24 y.o. G4P0030 at [redacted]w[redacted]d here for IOL 2/2 gHTN.  gHTN BP wnl -Continue to monitor   Labor: Minimal cervical change s/p FB x2. Start pitocin, consider AROM.  Fetal Wellbeing: Cat I Pain Control:  Planning for epidural Anticipated MOD: Vaginal  Zyier Dykema Autry-Lott, DO 11/23/2019, 6:27 PM PGY-2, [facility]

## 2019-11-23 NOTE — Progress Notes (Signed)
Patient ID: Robyn Cummings, female   DOB: 1995/12/29, 24 y.o.   MRN: [id number]  Labor Progress Note Robyn Cummings is a 24 y.o. G4P0030 at [redacted]w[redacted]d presented for IOL for cHTN.   S:  Called to room by RN to assess tracing. Pt asleep on left side in bed.  O:  BP (!) 106/59   Pulse 75   Temp 98.9 F (37.2 C) (Oral)   Resp 18   LMP 02/23/2019  EFM: baseline 145 bpm/ moderate variability/ + accels/ + decels  Pt having repeated late variables (into the 90s) with contractions. Pt had changed positions and received a LR bolus  Toco/IUPC: q3-70min but pt not feeling contractions at all, said her hip hurt occasionally.  Tracing initially looked like large accelerations with return to baseline but then became repeated late variables. Consulted Dr. Despina Hidden who recommended a dose of terbutaline and another LR bolus.  A/P: 24 y.o. G4P0030 [redacted]w[redacted]d  1. Labor: Latent 2. FWB: Cat 2, became Cat 1 after terbutaline and bolus 3. Pain: Well controlled without pain meds 4. Will continue to monitor FHR tracing, will recheck cervix at 5am for possible foley placement.  Bernerd Limbo, CNM 11/23/19 0430

## 2019-11-23 NOTE — Progress Notes (Signed)
LABOR PROGRESS NOTE  Robyn Cummings is a 24 y.o. G4P0030 at [redacted]w[redacted]d  admitted for IOL 2/2 cHTN.  Subjective: Doing well. Resting comfortably in between contractions.   Objective: BP 123/72   Pulse 79   Temp 98.9 F (37.2 C) (Oral)   Resp 19   LMP 02/23/2019   SpO2 99%  or  Vitals:   11/23/19 0700 11/23/19 0701 11/23/19 0705 11/23/19 0710  BP:  123/72    Pulse:  79    Resp:  19    Temp:      TempSrc:      SpO2: 100%  98% 99%   Dilation: 1.5 Effacement (%): Thick Cervical Position: Middle Station: Ballotable Presentation: Vertex Exam by:: MD FHT: baseline rate 135 bpm, moderate varibility, 15 x  15 acel, no decels Toco: minimal; interval appears to be 3-5 mins  Labs: Lab Results  Component Value Date   WBC 9.9 11/23/2019   HGB 10.3 (L) 11/23/2019   HCT 31.6 (L) 11/23/2019   MCV 84.5 11/23/2019   PLT 376 11/23/2019    Patient Active Problem List   Diagnosis Date Noted  . Chronic hypertension during pregnancy, antepartum 11/23/2019  . Gestational hypertension 11/23/2019  . Chronic hypertension 11/09/2019  . Marginal insertion of umbilical cord affecting management of mother in third trimester 09/15/2019  . Trichomonas infection 05/19/2019  . Supervision of high risk pregnancy, antepartum 05/08/2019  . 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 Cannabis abuse 05/09/2015    Assessment / Plan: 24 y.o. G4P0030 at [redacted]w[redacted]d here for IOL 2/2 cHTN.   cHTN (No antihypertensive therapy prior or during pregnancy) BPs wnl. -Continue monitor  Labor: s/p cytotec and cook cath. External os continues to be narrow. Consider replacing regular FB.  Fetal Wellbeing:  Cat I  Pain Control:  Maternally supported; IV pain medication Anticipated MOD: Vaginal  Darrian Grzelak Autry-Lott, DO 11/23/2019, 10:36 AM PGY-2, [facility]

## 2019-11-23 NOTE — Anesthesia Preprocedure Evaluation (Signed)
Anesthesia Evaluation  Patient identified by MRN, date of birth, ID band Patient awake    Reviewed: Patient's Chart, lab work & pertinent test results  Airway Mallampati: II  TM Distance: >3 FB Neck ROM: Full    Dental  (+) Teeth Intact   Pulmonary asthma , former smoker,    Pulmonary exam normal        Cardiovascular hypertension, negative cardio ROS   Rhythm:Regular Rate:Normal     Neuro/Psych  Headaches, Seizures -, Well Controlled,  Anxiety Depression Bipolar Disorder Childhood seizures, no medication    GI/Hepatic negative GI ROS, Neg liver ROS,   Endo/Other  negative endocrine ROS  Renal/GU negative Renal ROS  negative genitourinary   Musculoskeletal   Abdominal (+)  Abdomen: soft. Bowel sounds: normal.  Peds  (+) ADHD Hematology negative hematology ROS (+) anemia ,   Anesthesia Other Findings   Reproductive/Obstetrics (+) Pregnancy                             Anesthesia Physical Anesthesia Plan  ASA: II  Anesthesia Plan: Epidural   Post-op Pain Management:    Induction:   PONV Risk Score and Plan: 2  Airway Management Planned: Natural Airway  Additional Equipment: None  Intra-op Plan:   Post-operative Plan:   Informed Consent: I have reviewed the patients History and Physical, chart, labs and discussed the procedure including the risks, benefits and alternatives for the proposed anesthesia with the patient or authorized representative who has indicated his/her understanding and acceptance.     Dental advisory given  Plan Discussed with:   Anesthesia Plan Comments: (Lab Results      Component                Value               Date                      WBC                      12.2 (H)            11/23/2019                HGB                      9.8 (L)             11/23/2019                HCT                      30.4 (L)            11/23/2019                 MCV                      84.4                11/23/2019                PLT                      347                 11/23/2019          )  Anesthesia Quick Evaluation  

## 2019-11-23 NOTE — Progress Notes (Signed)
Robyn Cummings, [id number]  Subjective -Care assumed of 24 y.o. G4P0030 at [redacted]w[redacted]d who presents for IOL s/t CHTN. Pregnancy and medical history significant for MCI, Trich Infection with -TOC, and Anemia.  In room to meet acquaintance of patient and family.  Patient reports comfort with epidural, but some itching of her legs.     Objective Vitals:   11/23/19 2000 11/23/19 2030  BP: 128/81 120/69  Pulse: 73 82  Resp: 18 17  Temp:    SpO2:     No intake/output data recorded. No intake/output data recorded.  Physical Exam  IFO:YDXAJOIN: 3 Effacement (%): Thick Cervical Position: Middle Station: Ballotable Presentation: Vertex Exam by:: Randa Evens Autry-Lott DO  Membranes:  Pelvis: -Proven:N/A -Adequate: Yes Leopolds: -Position:Vertex -EFW:7 1/2-3/4 lbs  FHR: 135 bpm, Mod Var, -Decels, +Accels UC:  Qmin  Management Plan: Expectant:N/A Augmentation:N/A Induction Phase: S/p Cytotec and FB x 2  Assessment IUP at 39 wks IOL s/t CHTN Cat I FT GBS Negative  Plan -Reviewed POC in regards to  patient expectations and current labor phase. -Discussed AROM r/b including increased risk of infection, cord prolapse, fetal intolerance, and decreased labor time.  Discussed IUPC r/b, prior to insertion, including increased risk of infection and ability to adequately monitor quantity and strength of contractions. -Questions and concerns addressed and patient requests time to process. -Provider to return to discuss and implement plan if patient decides to proceed.  -Continue other mgmt as ordered  Cherre Robins, CNM 11/23/2019, 9:19 PM  Reassessment (10:06 PM) -Nurse call and reports patient agreeable to AROM and IUPC placement. -Provider to bedside.  Patient without further questions or concerns. -Amniotomy and IUPC performed without difficulty/incident. -Discussed r/b of fetal scalp electrode including infection, trauma to fetal scalp, and ability to monitor fetal heart rate more  accurately.  Patient w/o q/c and agrees to placement.  -FSE placed without difficult/incident. -Encouraged rest. -Will continue to monitor.  Cherre Robins MSN, CNM Advanced Practice Provider, Center for [employer]

## 2019-11-23 NOTE — Anesthesia Procedure Notes (Signed)
Epidural Patient location during procedure: OB Start time: 11/23/2019 7:05 PM End time: 11/23/2019 7:25 PM  Staffing Anesthesiologist: Atilano Median, DO Performed: anesthesiologist   Preanesthetic Checklist Completed: patient identified, IV checked, site marked, risks and benefits discussed, surgical consent, monitors and equipment checked, pre-op evaluation and timeout performed  Epidural Patient position: sitting Prep: ChloraPrep Patient monitoring: heart rate, continuous pulse ox and blood pressure Approach: midline Location: L3-L4 Injection technique: LOR saline  Needle:  Needle type: Tuohy  Needle gauge: 17 G Needle length: 9 cm Needle insertion depth: 8 cm Catheter type: closed end flexible Catheter size: 20 Guage Catheter at skin depth: 13 cm Test dose: negative and 1.5% lidocaine  Assessment Events: blood not aspirated, injection not painful, no injection resistance and no paresthesia  Additional Notes Patient identified. Risks/Benefits/Options discussed with patient including but not limited to bleeding, infection, nerve damage, paralysis, failed block, incomplete pain control, headache, blood pressure changes, nausea, vomiting, reactions to medications, itching and postpartum back pain. Confirmed with bedside nurse the patient's most recent platelet count. Confirmed with patient that they are not currently taking any anticoagulation, have any bleeding history or any family history of bleeding disorders. Patient expressed understanding and wished to proceed. All questions were answered. Sterile technique was used throughout the entire procedure. Please see nursing notes for vital signs. Test dose was given through epidural catheter and negative prior to continuing to dose epidural or start infusion. Warning signs of high block given to the patient including shortness of breath, tingling/numbness in hands, complete motor block, or any concerning symptoms with  instructions to call for help. Patient was given instructions on fall risk and not to get out of bed. All questions and concerns addressed with instructions to call with any issues or inadequate analgesia.    Reason for block:procedure for pain

## 2019-11-23 NOTE — H&P (Addendum)
Robyn Cummings is a 24 y.o. G71P0030 female at [redacted]w[redacted]d presenting for IOL for cHTN (one episode of HTN at an ED visit prior to pregnancy plus one elevated pressure this pregnancy). Pregnancy also complicated by a marginal cord insertion (appropriate fetal growth), trichomoniasis infection (TOC neg), anemia, transportation issues and food insecurity. Desires waterbirth, was connected to a doula. OB History    Gravida  4   Para  0   Term  0   Preterm  0   AB  3   Living  0     SAB  3   TAB  0   Ectopic  0   Multiple      Live Births             Past Medical History:  Diagnosis Date  . ADHD (attention deficit hyperactivity disorder)   . Anaphylactic reaction   . Anemia   . Anxiety   . Asthma   . Bipolar 1 disorder (HCC)   . Chronic back pain   . Dyspnea   . Hypertension   . Infection    UTI  . Migraine   . Polyhydramnios affecting pregnancy 09/15/2019   Followed by MFM  . Seizures (HCC)    has not had a seizure since childhood, no medications currently  . Sleep disorder   . Vaginal Pap smear, abnormal    Past Surgical History:  Procedure Laterality Date  . NO PAST SURGERIES     Family History: family history includes Anxiety disorder in her mother; Arthritis in her father; Asthma in her father; Cancer in her maternal aunt and paternal uncle; Diabetes in her maternal uncle and paternal grandmother; Stroke in her paternal grandmother. Social History:  reports that she quit smoking about 3 weeks ago. Her smoking use included cigarettes. She smoked 0.25 packs per day. She has never used smokeless tobacco. She reports previous drug use. Drug: Marijuana. She reports that she does not drink alcohol.     Maternal Diabetes: No Genetic Screening: Normal Maternal Ultrasounds/Referrals: Normal Fetal Ultrasounds or other Referrals:  None Maternal Substance Abuse:  Yes:  Type: Marijuana Significant Maternal Medications:  None Significant Maternal Lab Results:  Group B  Strep negative Other Comments:  None  Review of Systems  Eyes: Negative for photophobia and visual disturbance.  Musculoskeletal: Positive for back pain (low back pain throughout the pregnancy).  Neurological: Negative for headaches.  All other systems reviewed and are negative.  Maternal Medical History:  Fetal activity: Perceived fetal activity is normal.   Last perceived fetal movement was within the past hour.    Prenatal complications: PIH (trich w neg TOC), infection, polyhydramnios (resolved) and substance abuse (hx of MJ use).   No IUGR or pre-eclampsia.   Prenatal Complications - Diabetes: none.    Dilation: Fingertip Effacement (%): Thick Station: Ballotable Exam by:: Edd Arbour, CNM Temperature 98.5 F (36.9 C), temperature source Oral, last menstrual period 02/23/2019. Maternal Exam:  Abdomen: Patient reports no abdominal tenderness. Fetal presentation: vertex  Introitus: Normal vulva. Normal vagina.  Pelvis: adequate for delivery.   Cervix: Cervix evaluated by digital exam.     Fetal Exam Fetal Monitor Review: Mode: ultrasound.   Baseline rate: 145.  Variability: moderate (6-25 bpm).   Pattern: accelerations present and no decelerations.    Fetal State Assessment: Category I - tracings are normal.     Physical Exam Vitals and nursing note reviewed. Exam conducted with a chaperone present.  Constitutional:      Appearance:  Normal appearance. She is normal weight.  Eyes:     Pupils: Pupils are equal, round, and reactive to light.  Cardiovascular:     Rate and Rhythm: Normal rate.     Pulses: Normal pulses.  Pulmonary:     Effort: Pulmonary effort is normal.  Abdominal:     Palpations: Abdomen is soft.  Genitourinary:    General: Normal vulva.  Musculoskeletal:        General: Normal range of motion.  Skin:    General: Skin is warm and dry.     Capillary Refill: Capillary refill takes less than 2 seconds.  Neurological:     Mental Status:  She is alert and oriented to person, place, and time. Mental status is at baseline.  Psychiatric:        Mood and Affect: Mood normal.        Behavior: Behavior normal.        Thought Content: Thought content normal.        Judgment: Judgment normal.     Prenatal labs: ABO, Rh: --/--/O POS (11/08 1937) Antibody: NEG (11/08 1937) Rubella: 4.35 (05/14 1059) RPR: NON REACTIVE (11/08 1939)  HBsAg: Negative (05/14 1059)  HIV: Non Reactive (09/08 0917)  GBS: Negative/-- (11/03 0311)   Assessment/Plan: Admit to L&D for IOL at [redacted]w[redacted]d Labor: Pt comfortable with FOB at bedside for support. Did not select a doula. Will begin induction with buccal cytotec, reassess for FB placement at next check. Pt consented for WB, explained that her cHTN risks her out of waterbirth. Pt disappointed by verbalized understanding.  HTN: no s/sx of PEC, labs stable 11/21/19, BP normotensive Anemia: hbg=10.3  Edd Arbour, CNM, MSN, [facility] 11/23/19 1:38 AM

## 2019-11-23 NOTE — Progress Notes (Signed)
LABOR PROGRESS NOTE  Robyn Cummings is a 24 y.o. G4P0030 at [redacted]w[redacted]d  admitted for IOL 2/2 HTN.  Subjective: Doing well. Feeling occasional discomfort with contractions.   Objective: BP 111/63   Pulse 90   Temp 98.2 F (36.8 C) (Oral)   Resp 20   LMP 02/23/2019   SpO2 99%  or  Vitals:   11/23/19 0710 11/23/19 0815 11/23/19 1120 11/23/19 1555  BP:  128/81 139/78 111/63  Pulse:  91 85 90  Resp:  17 19 20   Temp:  98 F (36.7 C) 97.7 F (36.5 C) 98.2 F (36.8 C)  TempSrc:  Oral Oral Oral  SpO2: 99%      Dilation: 1.5 Effacement (%): Thick Cervical Position: Middle Station: Ballotable Presentation: Vertex Exam by:: MD FHT: baseline rate 115bpm, moderate varibility, 15 x 15 acel, no decel Toco: difficult tracing due to body habitus  Labs: Lab Results  Component Value Date   WBC 9.9 11/23/2019   HGB 10.3 (L) 11/23/2019   HCT 31.6 (L) 11/23/2019   MCV 84.5 11/23/2019   PLT 376 11/23/2019   Patient Active Problem List   Diagnosis Date Noted  . Chronic hypertension during pregnancy, antepartum 11/23/2019  . Gestational hypertension 11/23/2019  . Chronic hypertension 11/09/2019  . Marginal insertion of umbilical cord affecting management of mother in third trimester 09/15/2019  . Trichomonas infection 05/19/2019  . Supervision of high risk pregnancy, antepartum 05/08/2019  . 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 Cannabis abuse 05/09/2015   Assessment / Plan: 24 y.o. G4P0030 at [redacted]w[redacted]d here for IOL 2/2 gHTN.   gHTN BP wnl.  -Continue to monitor  Labor: Cervical exam unchanged from last. Multiple FB attempts, FB in place. Consider starting pitocin when appropriate.   Fetal Wellbeing: Cat I Pain Control:  Maternally supported, planning epidural Anticipated MOD: Vaginal  Robyn Jewel Autry-Lott, DO 11/23/2019, 4:36 PM PGY-2, [facility]

## 2019-11-23 NOTE — Progress Notes (Signed)
Patient ID: Robyn Cummings, female   DOB: 21-May-1995, 24 y.o.   MRN: [id number]  Labor Progress Note Robyn Cummings is a 24 y.o. G4P0030 at [redacted]w[redacted]d presented for IOL for cHTN  S:  Pt asleep on left side, tolerated procedure very well. Starting to feel contractions but coping well.  O:  BP 121/69   Pulse 88   Temp 98.9 F (37.2 C) (Oral)   Resp 18   LMP 02/23/2019   SpO2 98%  EFM: baseline 145 bpm/ moderate to minimal variability/ + accels/ one variable decel Toco/IUPC: UI  Minimal variability after catheter placement and fentanyl dosage. Dr. Despina Hidden aware and affirming of plan of care.  SVE: Dilation: 1 Effacement (%): Thick Cervical Position: Middle Station: Ballotable Presentation: Vertex Exam by:: Robyn Cummings, CNM  A/P: 24 y.o. G4P0030 [redacted]w[redacted]d  1. Labor: Latent labor 2. FWB: Cat 1 3. Pain: Well controlled with deep breathing 4. Cooks catheter placed and inflated with 82ml, will apply traction hourly until it falls out.   Bernerd Limbo, CNM 5:59 AM

## 2019-11-24 ENCOUNTER — Encounter (HOSPITAL_COMMUNITY): Payer: Self-pay | Admitting: Student

## 2019-11-24 DIAGNOSIS — O1002 Pre-existing essential hypertension complicating childbirth: Secondary | ICD-10-CM

## 2019-11-24 DIAGNOSIS — Z3A39 39 weeks gestation of pregnancy: Secondary | ICD-10-CM

## 2019-11-24 DIAGNOSIS — F952 Tourette's disorder: Secondary | ICD-10-CM | POA: Diagnosis present

## 2019-11-24 MED ORDER — ONDANSETRON HCL 4 MG PO TABS: 4.0000 mg | ORAL_TABLET | ORAL | Status: DC | PRN

## 2019-11-24 MED ORDER — IBUPROFEN 600 MG PO TABS
600.0000 mg | ORAL_TABLET | Freq: Four times a day (QID) | ORAL | Status: DC
  Administered 2019-11-25 – 2019-11-26 (×5): 600 mg via ORAL
  Filled 2019-11-24 (×5): qty 1

## 2019-11-24 MED ORDER — GENTAMICIN SULFATE 40 MG/ML IJ SOLN
5.0000 mg/kg/d | INTRAVENOUS | Status: AC
  Administered 2019-11-24: 370 mg via INTRAVENOUS
  Filled 2019-11-24: qty 9.25

## 2019-11-24 MED ORDER — PRENATAL MULTIVITAMIN CH
1.0000 | ORAL_TABLET | Freq: Every day | ORAL | Status: DC
  Administered 2019-11-25: 1 via ORAL
  Filled 2019-11-24: qty 1

## 2019-11-24 MED ORDER — BENZOCAINE-MENTHOL 20-0.5 % EX AERO
1.0000 "application " | INHALATION_SPRAY | CUTANEOUS | Status: DC | PRN
  Administered 2019-11-24: 1 via TOPICAL
  Filled 2019-11-24: qty 56

## 2019-11-24 MED ORDER — DIBUCAINE (PERIANAL) 1 % EX OINT: 1.0000 "application " | TOPICAL_OINTMENT | CUTANEOUS | Status: DC | PRN

## 2019-11-24 MED ORDER — SODIUM CHLORIDE 0.9 % IV SOLN
2.0000 g | Freq: Four times a day (QID) | INTRAVENOUS | Status: DC
  Administered 2019-11-24: 2 g via INTRAVENOUS
  Filled 2019-11-24: qty 2000

## 2019-11-24 MED ORDER — WITCH HAZEL-GLYCERIN EX PADS: 1.0000 "application " | MEDICATED_PAD | CUTANEOUS | Status: DC | PRN

## 2019-11-24 MED ORDER — IBUPROFEN 800 MG PO TABS
800.0000 mg | ORAL_TABLET | Freq: Once | ORAL | Status: AC
  Administered 2019-11-24: 800 mg via ORAL
  Filled 2019-11-24: qty 1

## 2019-11-24 MED ORDER — ACETAMINOPHEN 325 MG PO TABS
650.0000 mg | ORAL_TABLET | Freq: Four times a day (QID) | ORAL | Status: DC
  Administered 2019-11-25 – 2019-11-26 (×5): 650 mg via ORAL
  Filled 2019-11-24 (×5): qty 2

## 2019-11-24 MED ORDER — SENNOSIDES-DOCUSATE SODIUM 8.6-50 MG PO TABS
2.0000 | ORAL_TABLET | ORAL | Status: DC
  Administered 2019-11-25 – 2019-11-26 (×2): 2 via ORAL
  Filled 2019-11-24 (×2): qty 2

## 2019-11-24 MED ORDER — COCONUT OIL OIL
1.0000 "application " | TOPICAL_OIL | Status: DC | PRN
  Administered 2019-11-24: 1 via TOPICAL

## 2019-11-24 MED ORDER — DIPHENHYDRAMINE HCL 25 MG PO CAPS: 25.0000 mg | ORAL_CAPSULE | Freq: Four times a day (QID) | ORAL | Status: DC | PRN

## 2019-11-24 MED ORDER — TETANUS-DIPHTH-ACELL PERTUSSIS 5-2.5-18.5 LF-MCG/0.5 IM SUSY: 0.5000 mL | PREFILLED_SYRINGE | Freq: Once | INTRAMUSCULAR | Status: DC

## 2019-11-24 MED ORDER — LACTATED RINGERS AMNIOINFUSION: INTRAVENOUS | Status: DC

## 2019-11-24 MED ORDER — ONDANSETRON HCL 4 MG/2ML IJ SOLN: 4.0000 mg | INTRAMUSCULAR | Status: DC | PRN

## 2019-11-24 MED ORDER — SIMETHICONE 80 MG PO CHEW: 80.0000 mg | CHEWABLE_TABLET | ORAL | Status: DC | PRN

## 2019-11-24 NOTE — Progress Notes (Signed)
Kili Gracy MRN: [id number]  Subjective: -Nurse call and reports fetal tracing of concern.  Provider to bedside and patient resting in bed.  Reports some discomfort in LLQ.  Denies pressure.   Objective: BP 98/62   Pulse 84   Temp 98.4 F (36.9 C) (Oral)   Resp 16   LMP 02/23/2019   SpO2 99%  I/O last 3 completed shifts: In: -  Out: 400 [Urine:400] No intake/output data recorded.  Fetal Monitoring: FHT: 130 bpm, Min Var, -Decels, -Accels with progression to mod var with accels UC: MVUs at    Vaginal Exam: SVE:   Dilation: 4 Effacement (%): Thick Station: -3 Exam by:: [employer] RN Membranes: AROM x 8 hrs Internal Monitors: IUPC and FSE in place  Augmentation/Induction: Pitocin:14 mUn/min Cytotec: S/P 2 Doses  Assessment:  IUP at 39.1 weeks Cat I FT  IOL   Plan: -Exam performed by nurse. -Offered position change and patient declines. -Nurse instructed to place peanut. -Continue other mgmt as ordered   Valma Cava, CNM Advanced Practice Provider, Center for [facility] 11/24/2019, 7:05 AM

## 2019-11-24 NOTE — Progress Notes (Signed)
Labor Progress Note Robyn Cummings is a 24 y.o. G4P0030 at [redacted]w[redacted]d presented for IOL secondary to [place].  S: Pt now comfortable with epidural in place. No concerns at this time. Now endorsing a strong urge to push.  O:  BP 121/66   Pulse 69   Temp 99.1 F (37.3 C) (Oral)   Resp 18   LMP 02/23/2019   SpO2 99%  EFM: baseline 120/moderate variability/+accels/intermittent variable decels  CVE: Dilation: 10 Dilation Complete Date: 11/24/19 Dilation Complete Time: 1419 Effacement (%): 100 Cervical Position: Anterior Station: Plus 1 Presentation: Vertex Exam by:: Dr. Barb Merino   A&P: 24 y.o. [id number] [redacted]w[redacted]d presented for IOL secondary to cHTN. #Labor: Pt noted to have complete cervical dilation at 1430. Pushed for 1 hour from +1 fetal station and then labored down given for 1 hour given minimal urge to push. Restarted pushing at 1630, starting at nearly +2 fetal station. Good fetal tolerance with up-titration of pitocin. Confirmed OA on bedside ultrasound. #Pain: epidural in place #FWB: Category 2 strip given few intermittent variable decels. Reassuringly, pt making good progress with pushing. Will continue to monitor closely. #GBS negative #Triple I: fever to 100.63F at 0931 this AM in addition to increase in fetal baseline to 155. Now s/p amp and gentamycin. Now afebrile with good improvement in fetal heart rate baseline. #cHTN: No concerning signs/symptoms. Preeclampsia labs wnl except for Hgb 9.8 on admission. Recent blood pressures within normal range.  [name], MD 4:59 PM

## 2019-11-24 NOTE — Progress Notes (Signed)
Robyn Cummings MRN: [id number]  Subjective: -Patient resting in bed.  Reports some discomfort on left side near pelvis.  Recalls hitting epidural PCA x one about 15 minutes ago with no relief.   Objective: BP 116/68   Pulse 79   Temp 98.2 F (36.8 C) (Oral)   Resp 18   LMP 02/23/2019   SpO2 99%  No intake/output data recorded. Total I/O In: -  Out: 400 [Urine:400]  Fetal Monitoring: FHT: 120 bpm, Mod Var, -Decels, +Accels UC: MVUs at    Vaginal Exam: SVE:   Dilation: 3.5 Effacement (%): Thick Station: -2 Exam by:: Gerrit Heck CNM Membranes:AROM x  Internal Monitors: IUPC and FSE in place  Augmentation/Induction: Pitocin:64mUn/min Cytotec: S/P 2 Doses  Assessment:  IUP at 39.1 weeks Cat I FT  IOL  Plan: -Will turn patient to promote comfort. -Will continue to monitor and increase pitocin as appropriate.  -Continue other mgmt as ordered   Valma Cava, CNM Advanced Practice Provider, Center for [facility] 11/24/2019, 2:52 AM

## 2019-11-24 NOTE — Lactation Note (Signed)
This note was copied from a baby's chart. Lactation Consultation Note Baby 4 hrs old. Mom holding baby in football position STS. Mom stated has BF recently well. Denies painful latch.  Newborn behavior, feeding habits, STS, I&O, positioning, supply and demand discussed. Mom has tubular breast w/short shaft compressible nipples. Hand expression taught w/easily expressed colostrum. Discussed how to widen flange if needed.  Mom encouraged to feed baby 8-12 times/24 hours and with feeding cues. Mom encouraged to waken baby for feeds if hasn't cued in 4 hrs.  Encouraged to call for assistance if needed. Lactation brochure given.  Patient Name: Robyn Cummings [id number] Date: 11/24/2019 Reason for consult: Initial assessment;Primapara;Term   Maternal Data Has patient been taught Hand Expression?: Yes Does the patient have breastfeeding experience prior to this delivery?: No  Feeding Feeding Type: Breast Fed  LATCH Score Latch: Repeated attempts needed to sustain latch, nipple held in mouth throughout feeding, stimulation needed to elicit sucking reflex.  Audible Swallowing: A few with stimulation  Type of Nipple: Everted at rest and after stimulation (short shaft very compressible nipples)  Comfort (Breast/Nipple): Soft / non-tender  Hold (Positioning): Assistance needed to correctly position infant at breast and maintain latch.  LATCH Score: 7  Interventions Interventions: Breast feeding basics reviewed;Skin to skin;Hand express  Lactation Tools Discussed/Used WIC Program: Yes   Consult Status Consult Status: Follow-up Date: 11/25/19 Follow-up type: In-patient    Charyl Dancer 11/24/2019, 10:45 PM

## 2019-11-24 NOTE — Discharge Summary (Signed)
Postpartum Discharge Summary  Date of Service updated 11/26/19    Patient Name: Robyn Cummings DOB: 11/23/1995 MRN: [id number]  Date of admission: 11/23/2019 Delivery date:11/24/2019  Delivering provider: Randa Ngo  Date of discharge: 11/26/2019  Admitting diagnosis: Chronic hypertension during pregnancy, antepartum [O10.919] Gestational hypertension [O13.9] Intrauterine pregnancy: [redacted]w[redacted]d    Secondary diagnosis:  Principal Problem:   Vaginal delivery Active Problems:   Cannabis abuse   Supervision of high risk pregnancy, antepartum   Trichomonas infection   Marginal insertion of umbilical cord affecting management of mother in third trimester   Chronic hypertension   Chronic hypertension during pregnancy, antepartum   Tourette's syndrome   Chorioamnionitis   Iron deficiency anemia  Additional problems: as noted above   Discharge diagnosis: Vaginal delivery                                         Post partum procedures:none Augmentation: AROM, Pitocin, Cytotec and IP Foley Complications: Triple I (maternal fever s/p antibiotics prior to delivery)  Hospital course: Induction of Labor With Vaginal Delivery   24y.o. yo G639 [id number] [id number] admitted to the hospital 11/23/2019 for induction of labor.  Indication for induction: cHTN.  Patient did have fever prior to delivery and received antibiotics for triple I. Patient had an uncomplicated labor course as follows: Membrane Rupture Time/Date: 10:12 PM ,11/23/2019   Delivery Method:Vaginal, Spontaneous  Episiotomy: None  Lacerations:  1st degree  Details of delivery can be found in separate delivery note.  Patient had a routine postpartum course. Patient is discharged home 11/26/19.  Newborn Data: Birth date:11/24/2019  Birth time:5:51 PM  Gender:Female  Living status:Living  Apgars:8 ,9  Weight:3155 g   Magnesium Sulfate received: No BMZ received: No Rhophylac:N/A MMR:N/A T-DaP: declined Flu: No,  declined Transfusion:No  Physical exam  Vitals:   11/25/19 0237 11/25/19 0628 11/25/19 2110 11/26/19 0613  BP: 132/84 115/84 (!) 117/91 114/88  Pulse: 76 72 72 74  Resp: 18 18 18 17   Temp: 97.8 F (36.6 C) 98.1 F (36.7 C) 97.6 F (36.4 C) 98 F (36.7 C)  TempSrc: Oral  Oral Oral  SpO2:   100% 100%   General: alert, cooperative and no distress Lochia: appropriate Uterine Fundus: firm Incision: N/A DVT Evaluation: No evidence of DVT seen on physical exam. Labs: Lab Results  Component Value Date   WBC 12.2 (H) 11/23/2019   HGB 9.8 (L) 11/23/2019   HCT 30.4 (L) 11/23/2019   MCV 84.4 11/23/2019   PLT 347 11/23/2019   CMP Latest Ref Rng & Units 11/21/2019  Glucose 70 - 99 mg/dL 95  BUN 6 - 20 mg/dL 8  Creatinine 0.44 - 1.00 mg/dL 0.60  Sodium 135 - 145 mmol/L 136  Potassium 3.5 - 5.1 mmol/L 3.8  Chloride 98 - 111 mmol/L 109  CO2 22 - 32 mmol/L 19(L)  Calcium 8.9 - 10.3 mg/dL 8.7(L)  Total Protein 6.5 - 8.1 g/dL 6.2(L)  Total Bilirubin 0.3 - 1.2 mg/dL 0.4  Alkaline Phos 38 - 126 U/L 230(H)  AST 15 - 41 U/L 11(L)  ALT 0 - 44 U/L 19   Edinburgh Score: Edinburgh Postnatal Depression Scale Screening Tool 11/25/2019  I have been able to laugh and see the funny side of things. 0  I have looked forward with enjoyment to things. 0  I have blamed myself unnecessarily when  things went wrong. 0  I have been anxious or worried for no good reason. 1  I have felt scared or panicky for no good reason. 1  Things have been getting on top of me. 0  I have been so unhappy that I have had difficulty sleeping. 0  I have felt sad or miserable. 0  I have been so unhappy that I have been crying. 0  The thought of harming myself has occurred to me. 0  Edinburgh Postnatal Depression Scale Total 2     After visit meds:  Allergies as of 11/26/2019      Reactions   Mushroom Extract Complex Anaphylaxis   Anything with mushrooms   Lime [lime, Sulfurated] Other (See Comments)   Skin  rash    Spinach Nausea And Vomiting   "can't have cooked spinach"      Medication List    STOP taking these medications   cyclobenzaprine 10 MG tablet Commonly known as: FLEXERIL   Mag-Oxide 200 MG Tabs Generic drug: Magnesium Oxide     TAKE these medications   acetaminophen 325 MG tablet Commonly known as: Tylenol Take 2 tablets (650 mg total) by mouth every 6 (six) hours.   albuterol 108 (90 Base) MCG/ACT inhaler Commonly known as: VENTOLIN HFA Inhale 1-2 puffs into the lungs every 6 (six) hours as needed for wheezing or shortness of breath.   Blood Pressure Kit Devi 1 kit by Does not apply route once a week. Check Blood Pressure regularly and record readings into the Babyscripts App.  Large Cuff.  DX O90.0   coconut oil Oil Apply 1 application topically as needed.   Comfort Fit Maternity Supp Lg Misc 1 Units by Does not apply route daily.   ferrous sulfate 325 (65 FE) MG tablet Commonly known as: FerrouSul Take 1 tablet (325 mg total) by mouth every other day.   ibuprofen 600 MG tablet Commonly known as: ADVIL Take 1 tablet (600 mg total) by mouth every 6 (six) hours.   norethindrone 0.35 MG tablet Commonly known as: Ortho Micronor Take 1 tablet (0.35 mg total) by mouth daily.   Vitafol FE+ 90-0.6-0.4-200 MG Caps Take 1 capsule by mouth at bedtime.   vitamin C 250 MG tablet Commonly known as: ASCORBIC ACID Take 1 tablet (250 mg total) by mouth every other day. Take with iron.        Discharge home in stable condition Infant Feeding: Breast Infant Disposition:home with mother Discharge instruction: per After Visit Summary and Postpartum booklet. Activity: Advance as tolerated. Pelvic rest for 6 weeks.  Diet: routine diet Future Appointments: Future Appointments  Date Time Provider [place]  12/01/2019  1:20 PM [place] None  12/23/2019  2:00 PM Nugent, Gerrie Nordmann, NP [facility] None   Follow up Visit: Message sent to [facility] on 11/24/19  Please schedule this patient for a In person postpartum visit in 4 weeks with the following provider: Any provider. Additional Postpartum F/U:BP check 1 week  High risk pregnancy complicated by: cHTN, Tourette's, Anemia Delivery mode:  Vaginal, Spontaneous  Anticipated Birth Control:  POPs, rx sent to pharmacy, discuss long term options again at postpartum visit   [id number]/00/9381 Arrie Senate, MD

## 2019-11-24 NOTE — Progress Notes (Signed)
Labor Progress Note Nariyah Osias is a 24 y.o. G4P0030 at [redacted]w[redacted]d presented for IOL secondary to [place]. S:   O:  BP (!) 99/59   Pulse 100   Temp (!) 100.6 F (38.1 C) (Axillary) Comment (Src): pt drinking juice  Resp 18   LMP 02/23/2019   SpO2 99%  EFM: baseline 155/moderate variability/+accels/recent intermittent variable and late decels now resolved s/p initiation of amnioinfusion, IVF bolus & decrease in pit  CVE: Dilation: 6.5 Effacement (%): 90 Cervical Position: Anterior Station: -1 Presentation: Vertex Exam by:: Enis Slipper, RN   A&P: 24 y.o. [phone] [redacted]w[redacted]d presented for IOL secondary to cHTN. #Labor: Progressing well with up-titration of pitocin. Given recent Category 2 strip, decreased pit from 22 to 11. Will plan to recheck in 2 hours from last check or sooner as clinically indicated. #Pain: epidural in place #FWB: Category 2 strip recently given intermittent variable and late decels. Now Category 1 strip s/p initiation of amnioinfusion, IVF bolus, maternal repositioning and decrease in pit. Will continue to monitor FHT closely. #GBS negative #Triple I: fever to 100.66F at 0931 this AM in addition to increase in fetal baseline to 155. Amp and gent initiated and tylenol administered. #cHTN: No concerning signs/symptoms. Preeclampsia labs wnl except for Hgb 9.8 on admission. Recent blood pressures within normal range.  [name], MD 11:44 AM

## 2019-11-25 NOTE — Anesthesia Postprocedure Evaluation (Signed)
Anesthesia Post Note  Patient: Robyn Cummings, Robyn Cummings  Procedure(s) Performed: AN AD HOC LABOR EPIDURAL     Patient location during evaluation: Mother Baby Anesthesia Type: Epidural Level of consciousness: awake and alert Pain management: pain level controlled Vital Signs Assessment: post-procedure vital signs reviewed and stable Respiratory status: spontaneous breathing, nonlabored ventilation and respiratory function stable Cardiovascular status: stable Postop Assessment: no headache, no backache and epidural receding Anesthetic complications: no   No complications documented.  Last Vitals:  Vitals:   11/25/19 0237 11/25/19 0628  BP: 132/84 115/84  Pulse: 76 72  Resp: 18 18  Temp: 36.6 C 36.7 C  SpO2:      Last Pain:  Vitals:   11/25/19 0840  TempSrc:   PainSc: 0-No pain   Pain Goal:                   Robyn Cummings

## 2019-11-25 NOTE — Progress Notes (Signed)
Post Partum Day 1 Subjective: no complaints, up ad lib, voiding and tolerating PO  Objective: Blood pressure 115/84, pulse 72, temperature 98.1 F (36.7 C), resp. rate 18, last menstrual period 02/23/2019, SpO2 99 %, unknown if currently breastfeeding.  Physical Exam:  General: alert, cooperative and no distress Lochia: appropriate Uterine Fundus: firm Incision: n/a DVT Evaluation: No evidence of DVT seen on physical exam.  Recent Labs    11/23/19 0019 11/23/19 1800  HGB 10.3* 9.8*  HCT 31.6* 30.4*    Assessment/Plan: Plan for discharge tomorrow and Social Work consult   LOS: 2 days   Robyn Cummings 11/25/2019, 7:45 AM

## 2019-11-25 NOTE — Lactation Note (Signed)
This note was copied from a baby's chart. Lactation Consultation Note  Patient Name: Robyn Cummings [id number] Date: 11/25/2019 Reason for consult: Follow-up assessment  Follow-up visit to 25 hours infant with 2.06% weight loss.   Mother is holding infant upon arrival. Infant is cueing. Offered assistance with latch. Undressed infant and set up support pillows for football position to left breast. Infant is tongue-thrusting. Latched infant after a few attempts. Breast is very soft and used a rolled cloth to prompt it up. Noted suckling and swallowing. Mother is experiencing some uterine crapping.   Plan:   1-Breastfeeding on demand, ensuring a deep, comfortable latch.  2-Undressing infant and place skin to skin when ready to breastfeed 3-Keep infant awake during breastfeeding session: massaging breast, infant's hand/shoulder/feet 4-Monitor voids and stools as signs good intake.  5-Contact LC as needed for feeds/support/concerns/questions  Infant still breastfeeding upon leaving room.  Maternal Data Does the patient have breastfeeding experience prior to this delivery?: No  Feeding Feeding Type: Breast Fed  LATCH Score Latch: Grasps breast easily, tongue down, lips flanged, rhythmical sucking.  Audible Swallowing: Spontaneous and intermittent  Type of Nipple: Everted at rest and after stimulation  Comfort (Breast/Nipple): Soft / non-tender  Hold (Positioning): Assistance needed to correctly position infant at breast and maintain latch.  LATCH Score: 9  Interventions Interventions: Breast feeding basics reviewed;Assisted with latch;Skin to skin;Adjust position;Position options;Support pillows  Lactation Tools Discussed/Used     Consult Status Consult Status: Follow-up Date: 11/26/19 Follow-up type: In-patient    Deante Blough A Higuera Ancidey 11/25/2019, 6:51 PM

## 2019-11-25 NOTE — Clinical Social Work Maternal (Signed)
CLINICAL SOCIAL WORK MATERNAL/CHILD NOTE  Patient Details  Name: Robyn Cummings MRN: [id number] Date of Birth: 09/09/1995  Date:  11/25/2019  Clinical Social Worker Initiating Note:  Hortencia Pilar, LCSW Date/Time: Initiated:  11/25/19/0930     Child's Name:  Robyn Cummings   Biological Parents:  Mother, Father (Robyn Cummings)   Need for Interpreter:  None   Reason for Referral:  Current Substance Use/Substance Use During Pregnancy , Behavioral Health Concerns   Address:  [address] [place] [address]    Phone number:  [phone] (home)     Additional phone number: none   Household Members/Support Persons (HM/SP):   Household Member/Support Person 1   HM/SP Name Relationship DOB or Age  HM/SP -1  Robyn Cummings [name] 08/05/1995  HM/SP -2        HM/SP -3        HM/SP -4        HM/SP -5        HM/SP -6        HM/SP -7        HM/SP -8          Natural Supports (not living in the home):  Spouse/significant other (Robyn (FOB))   Professional Supports: None   Employment: Other (comment)   Type of Work: [name] reports that she is on leave from [employer]   Education:  9 to 11 years   Homebound arranged:  n/a  Surveyor, quantity Resources:  Medicaid   Other Resources:  [employer], Sales executive    Cultural/Religious Considerations Which May Impact Care:  none   Strengths:  Ability to meet basic needs , Compliance with medical plan    Psychotropic Medications:     None at this time, however [name] expressed desire to get medication for Tourette's.     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 CSW also noted other social needs and concerns. CSW went to speak with [name] at bedside to address further needs.   CSW congratulated [name] on the birth of infant. CSW advised [name] of CSW's role and the reason for CSW coming to speak with her. [name] Expressed that used THC in July. [name] expressed that this was her last use and that she "found out that I was pregnancy in April but I still used". CSW asked [name] if she had a certain reason for using in which [name] Expressed that she "would use to help with my Tourette's, depression and other mental health". CSW understanding and asked [name] about her UDS being positive for Opiates earlier in November. [name] expressed not being aware of this and advised CSW "idk because I don't use drugs". CSW inquired from [facility] on medications that [name] have been taking while pregnant that may have caused [name] to be positive for Opiates, however [name] expressed not taking any medication aside form Flexeril and "iron". CSW understanding and advised [name]  of the hospital drug screen policy. [name] Expressed "I only use herbs". CSW continued to explain to [name] that if infants UDS or CDS returns positive for any substances  that [name] was not given by MD or prescribed by an MD then CSW would need to make CPS report. [name] expressed that she understood and reported no other substance  use. [name] was asked about current and previous CPS hx in which [name] expressed that she ws involve din Foster care in [place] "[place] aged out". CSW understanding of this and advised [name] that this was no issue.   CSW inquired from [facility] On he mental health diagnosis. [name] Expressed that she was diagnose with all her mental health hx "as a young child". [name] expressed that she has Bipolar, depression, ADHD and anxiety, in which none  are being managed at this time. [name] reported CSW that she also has  Tourette's "which caused me to loose control of my body and what I say-that has been happening a lot lately". CSW asked [name] if she was taking any medications for this however [name] expressed that she isn't but expressed a desire to be started on medication for this. CSW advised [name] that CSW would reach out to [facility] provider to make them aware of [name]'s desires. [name] expressed this being fine. [name] was offered further resources for therapy and medication for other mental  Health however [name] declined resources at this time. CSW advised [name] that has no other mental heath hx. [name] expressed that her mental health has been well for her.   CSW inquired from [facility] on who her supports are during this time. MO expressed "me. Myself and I". CSW asked [name] about FOB in which [name] expressed his name is Robyn Cummings and he is 25 years old. CSW asked [name] if he was a support for her in which [name] expressed that he is but [name] also expressed "I like to t be independent". CSW asked [name] if Asher Muir is staying with her to help her with infant however [name] expressed "we don't live together". [name] expressed that Asher Muir does and can take her to the store to get groceries and can take her to appointments. [name] also reported that she gets [facility] and [employer] as well as her home has now been mold free thanks to [employer].CSW advised [name] of the possibility to use [employer] to help get items from grocery store when Asher Muir is not available to take [name]. [name] was shown how to start this process with the app.  [name] expressed that she has all needed items to care for infant with plans for infant to sleep in basinet once arrived home. [name] reported to CSW that she has the ability to obtain more pamper and wipes but does report "I wont be working". CSW advised [name] that CSW would make referral for her and have them reach out to her for further support as needed. [name] agreeable to this.   CSW took time to provide [name] with PPD and SIDS education. [name] was given PPD Checklist  in order to keep track of feelings as they relate to PPD. [name] thanked CSW and reported no other needs.   CSW will continue to monitor infants UDS and CDS and make CPS report if warranted. No barrier to d/c at this time.   CSW Plan/Description:  No Further Intervention Required/No Barriers to Discharge, Sudden Infant Death Syndrome (SIDS) Education, Perinatal Mood and Anxiety Disorder (PMADs) Education, Hospital Drug Screen Policy Information,  CSW Will Continue to Monitor Umbilical Cord Tissue Drug Screen Results and Make Report if Celso Sickle, LCSWA 11/25/2019, 10:09 AM

## 2019-11-25 NOTE — Lactation Note (Signed)
This note was copied from a baby's chart. Lactation Consultation Note  Patient Name: Robyn Cummings [id number] Date: 11/25/2019 Reason for consult: Mother's request  LC back to room per mother's request. Mother has interest in pumping and has her personal pump. Talked to mother about hospital grade DEBP. Mother agrees to try. Demonstrated how to use DEBP to stimulate breasts. Reviewed milk storage and proper care of parts.   Infant is also showing hunger cues. Assisted mother with modified cradle latch to right breast. Provided support with pillows. Infant latches successfully at once. Noted suckling and swallowing. Discussed normal newborn behavior and cluster-feeding.  Plan: 1-Breastfeed following hunger cues 2-Pump using initiation setting or hand express to supplement 3-Spoon or fingerfeed EBM  4-Contact LC for support   All questions answered at this time.  Infant still breastfeeding upon leaving room.    Maternal Data Does the patient have breastfeeding experience prior to this delivery?: No  Feeding Feeding Type: Breast Fed  LATCH Score Latch: Grasps breast easily, tongue down, lips flanged, rhythmical sucking.  Audible Swallowing: Spontaneous and intermittent  Type of Nipple: Everted at rest and after stimulation  Comfort (Breast/Nipple): Soft / non-tender  Hold (Positioning): Assistance needed to correctly position infant at breast and maintain latch.  LATCH Score: 9  Interventions Interventions: Assisted with latch;Breast massage;Hand express;Adjust position;DEBP;Expressed milk;Position options;Support pillows  Lactation Tools Discussed/Used Pump Review: Setup, frequency, and cleaning;Milk Storage Initiated by:: Merita Hawks IBCLC Date initiated:: 11/25/19   Consult Status Consult Status: Follow-up Date: 11/26/19 Follow-up type: In-patient    Grayling Schranz A Higuera Ancidey 11/25/2019, 8:41 PM

## 2019-11-26 DIAGNOSIS — O411290 Chorioamnionitis, unspecified trimester, not applicable or unspecified: Secondary | ICD-10-CM | POA: Diagnosis not present

## 2019-11-26 DIAGNOSIS — D509 Iron deficiency anemia, unspecified: Secondary | ICD-10-CM | POA: Diagnosis present

## 2019-11-26 MED ORDER — COCONUT OIL OIL: 1.0000 "application " | TOPICAL_OIL | 0 refills | Status: DC | PRN

## 2019-11-26 MED ORDER — VITAMIN C 250 MG PO TABS: 250.0000 mg | ORAL_TABLET | ORAL | Status: DC

## 2019-11-26 MED ORDER — ACETAMINOPHEN 325 MG PO TABS: 650.0000 mg | ORAL_TABLET | Freq: Four times a day (QID) | ORAL | Status: AC

## 2019-11-26 MED ORDER — NORETHINDRONE 0.35 MG PO TABS: 1.0000 | ORAL_TABLET | Freq: Every day | ORAL | 11 refills | Status: DC

## 2019-11-26 MED ORDER — IBUPROFEN 600 MG PO TABS
600.0000 mg | ORAL_TABLET | Freq: Four times a day (QID) | ORAL | 0 refills | Status: DC
Start: 2019-11-26 — End: 2022-02-22

## 2019-11-26 NOTE — Lactation Note (Signed)
This note was copied from a baby's chart. Lactation Consultation Note  Patient Name: Robyn Cummings MCNOB'[id number] Date: 11/26/2019 Reason for consult: Follow-up assessment  P1 mother whose infant is now 70 hours old.  This is a term baby at 39+1 weeks.  Baby was asleep next to mother when I arrived.  Mother had no questions/concerns related to breast feeding.  She has received many LC visits.  Mother stated that baby has a more difficult time latching to the left breast.  Upon assessment, both breasts are soft and non tender and nipples are intact.  Mother had coconut oil at bedside but was not told how to use it.  I explained usage and advised her to begin now for comfort.  Engorgement prevention/treatment discussed.  Mother will call for any further concerns prior to discharge.     Maternal Data    Feeding Feeding Type: Breast Fed  LATCH Score                   Interventions    Lactation Tools Discussed/Used     Consult Status Consult Status: Complete Date: 11/26/19 Follow-up type: Call as needed    Marija Calamari R Henriette Hesser 11/26/2019, 8:45 AM

## 2019-11-26 NOTE — Discharge Instructions (Signed)
-take prenatal vitamin everyday, drink plenty of water while breastfeeding -hold baby up to breast every 2 hours -take micronor (birth control pill) at the same time everyday, pick this up at [facility] -take ferrous sulfate 325 mg daily with vitamin c every other day for low iron, this will also help vaginal healing and milk supply -blood pressure check and post partum appt  Postpartum Care After Vaginal Delivery This sheet gives you information about how to care for yourself from the time you deliver your baby to up to 6-12 weeks after delivery (postpartum period). Your health care provider may also give you more specific instructions. If you have problems or questions, contact your health care provider. Follow these instructions at home: Vaginal bleeding  It is normal to have vaginal bleeding (lochia) after delivery. Wear a sanitary pad for vaginal bleeding and discharge. ? During the first week after delivery, the amount and appearance of lochia is often similar to a menstrual period. ? Over the next few weeks, it will gradually decrease to a dry, yellow-brown discharge. ? For most women, lochia stops completely by 4-6 weeks after delivery. Vaginal bleeding can vary from woman to woman.  Change your sanitary pads frequently. Watch for any changes in your flow, such as: ? A sudden increase in volume. ? A change in color. ? Large blood clots.  If you pass a blood clot from your vagina, save it and call your health care provider to discuss. Do not flush blood clots down the toilet before talking with your health care provider.  Do not use tampons or douches until your health care provider says this is safe.  If you are not breastfeeding, your period should return 6-8 weeks after delivery. If you are feeding your child breast milk only (exclusive breastfeeding), your period may not return until you stop breastfeeding. Perineal care  Keep the area between the vagina and the anus (perineum)  clean and dry as told by your health care provider. Use medicated pads and pain-relieving sprays and creams as directed.  If you had a cut in the perineum (episiotomy) or a tear in the vagina, check the area for signs of infection until you are healed. Check for: ? More redness, swelling, or pain. ? Fluid or blood coming from the cut or tear. ? Warmth. ? Pus or a bad smell.  You may be given a squirt bottle to use instead of wiping to clean the perineum area after you go to the bathroom. As you start healing, you may use the squirt bottle before wiping yourself. Make sure to wipe gently.  To relieve pain caused by an episiotomy, a tear in the vagina, or swollen veins in the anus (hemorrhoids), try taking a warm sitz bath 2-3 times a day. A sitz bath is a warm water bath that is taken while you are sitting down. The water should only come up to your hips and should cover your buttocks. Breast care  Within the first few days after delivery, your breasts may feel heavy, full, and uncomfortable (breast engorgement). Milk may also leak from your breasts. Your health care provider can suggest ways to help relieve the discomfort. Breast engorgement should go away within a few days.  If you are breastfeeding: ? Wear a bra that supports your breasts and fits you well. ? Keep your nipples clean and dry. Apply creams and ointments as told by your health care provider. ? You may need to use breast pads to absorb milk that  leaks from your breasts. ? You may have uterine contractions every time you breastfeed for up to several weeks after delivery. Uterine contractions help your uterus return to its normal size. ? If you have any problems with breastfeeding, work with your health care provider or Advertising copywriter.  If you are not breastfeeding: ? Avoid touching your breasts a lot. Doing this can make your breasts produce more milk. ? Wear a good-fitting bra and use cold packs to help with  swelling. ? Do not squeeze out (express) milk. This causes you to make more milk. Intimacy and sexuality  Ask your health care provider when you can engage in sexual activity. This may depend on: ? Your risk of infection. ? How fast you are healing. ? Your comfort and desire to engage in sexual activity.  You are able to get pregnant after delivery, even if you have not had your period. If desired, talk with your health care provider about methods of birth control (contraception). Medicines  Take over-the-counter and prescription medicines only as told by your health care provider.  If you were prescribed an antibiotic medicine, take it as told by your health care provider. Do not stop taking the antibiotic even if you start to feel better. Activity  Gradually return to your normal activities as told by your health care provider. Ask your health care provider what activities are safe for you.  Rest as much as possible. Try to rest or take a nap while your baby is sleeping. Eating and drinking   Drink enough fluid to keep your urine pale yellow.  Eat high-fiber foods every day. These may help prevent or relieve constipation. High-fiber foods include: ? Whole grain cereals and breads. ? Brown rice. ? Beans. ? Fresh fruits and vegetables.  Do not try to lose weight quickly by cutting back on calories.  Take your prenatal vitamins until your postpartum checkup or until your health care provider tells you it is okay to stop. Lifestyle  Do not use any products that contain nicotine or tobacco, such as cigarettes and e-cigarettes. If you need help quitting, ask your health care provider.  Do not drink alcohol, especially if you are breastfeeding. General instructions  Keep all follow-up visits for you and your baby as told by your health care provider. Most women visit their health care provider for a postpartum checkup within the first 3-6 weeks after delivery. Contact a health  care provider if:  You feel unable to cope with the changes that your child brings to your life, and these feelings do not go away.  You feel unusually sad or worried.  Your breasts become red, painful, or hard.  You have a fever.  You have trouble holding urine or keeping urine from leaking.  You have little or no interest in activities you used to enjoy.  You have not breastfed at all and you have not had a menstrual period for 12 weeks after delivery.  You have stopped breastfeeding and you have not had a menstrual period for 12 weeks after you stopped breastfeeding.  You have questions about caring for yourself or your baby.  You pass a blood clot from your vagina. Get help right away if:  You have chest pain.  You have difficulty breathing.  You have sudden, severe leg pain.  You have severe pain or cramping in your lower abdomen.  You bleed from your vagina so much that you fill more than one sanitary pad in one hour.  Bleeding should not be heavier than your heaviest period.  You develop a severe headache.  You faint.  You have blurred vision or spots in your vision.  You have bad-smelling vaginal discharge.  You have thoughts about hurting yourself or your baby. If you ever feel like you may hurt yourself or others, or have thoughts about taking your own life, get help right away. You can go to the nearest emergency department or call:  Your local emergency services (911 in the [place]  A suicide crisis helpline, such as the National Suicide Prevention Lifeline at 5186478270. This is open 24 hours a day. Summary  The period of time right after you deliver your newborn up to 6-12 weeks after delivery is called the postpartum period.  Gradually return to your normal activities as told by your health care provider.  Keep all follow-up visits for you and your baby as told by your health care provider. This information is not intended to replace advice given  to you by your health care provider. Make sure you discuss any questions you have with your health care provider. Document Revised: 12/21/2016 Document Reviewed: 10/01/2016 Elsevier Patient Education  2020 [employer].

## 2019-12-01 ENCOUNTER — Other Ambulatory Visit: Payer: Self-pay

## 2019-12-01 ENCOUNTER — Ambulatory Visit (INDEPENDENT_AMBULATORY_CARE_PROVIDER_SITE_OTHER): Payer: Medicaid Other | Admitting: Advanced Practice Midwife

## 2019-12-01 VITALS — BP 133/89 | HR 73 | Temp 98.7°F | Ht 64.0 in | Wt 222.4 lb

## 2019-12-01 DIAGNOSIS — I10 Essential (primary) hypertension: Secondary | ICD-10-CM

## 2019-12-01 DIAGNOSIS — O9279 Other disorders of lactation: Secondary | ICD-10-CM | POA: Diagnosis not present

## 2019-12-01 NOTE — Progress Notes (Signed)
    Subjective:  Robyn Cummings is a 24 y.o. female here for BP check status post vaginal delivery at [redacted]w[redacted]d on 11/24/2019.  Hypertension ROS: no medication side effects noted, patient does not perform home BP monitoring, no TIA's, no chest pain on exertion, no dyspnea on exertion, no swelling of ankles, no orthostatic dizziness or lightheadedness, no orthopnea or paroxysmal nocturnal dyspnea, no palpitations.  Patient reported having right breast pain/swelling. She also reported that right breast is lumpy and hard. Patient has stopped breastfeeding due to the pain.    Objective:  BP 133/89 (BP Location: Right Arm, Patient Position: Sitting, Cuff Size: Normal)   Pulse 73   Temp 98.7 F (37.1 C) (Oral)   Ht 5\' 4"  (1.626 m)   Wt 222 lb 6.4 oz (100.9 kg)   LMP 02/23/2019   Breastfeeding No   BMI 38.17 kg/m   Appearance alert, well appearing, and in no distress, oriented to person, place, and time and normal appearing weight. General exam BP noted to be well controlled today in office.    Assessment:   Blood Pressure stable and asymptomatic.   Plan:  Patient to see midwife for breast pain/swelling.  Follow up in 4 weeks for postpartum visit.  02/25/2019, RN

## 2019-12-01 NOTE — Progress Notes (Signed)
  GYNECOLOGY PROGRESS NOTE  History:  24 y.o. [id number] who is PPD#6 following NSVD presents to [facility] office today for RN visit for BP check.  Upon arrival she reported painful breasts after she stopped breastfeeding 4-5 days ago.  She reports that she wants to breastfeed but latch has been painful so she stopped. She has an electric pump at home but does not know how to use it and the hand pump takes a long time so she has not breastfed or pumped in 4-5 days. Her breasts have gotten progressively harder with knots in them and more painful in the last 2 days.  She denies any h/a, fever, chills, or n/v.  Otherwise she feels well.   The following portions of the patient's history were reviewed and updated as appropriate: allergies, current medications, past family history, past medical history, past social history, past surgical history and problem list.   Review of Systems:  Pertinent items are noted in HPI.   Objective:  Physical Exam Blood pressure 133/89, pulse 73, temperature 98.7 F (37.1 C), temperature source Oral, height 5\' 4"  (1.626 m), weight 222 lb 6.4 oz (100.9 kg), last menstrual period 02/23/2019, not currently breastfeeding. VS reviewed, nursing note reviewed,  Constitutional: well developed, well nourished, no distress HEENT: normocephalic CV: normal rate Pulm/chest wall: normal effort Breast Exam: bilateral breasts firm, warm to touch with palpable hard masses ranging in size from 1 cm to 3 -4 cm in diameter. Tender to touch on exam.  Abdomen: soft Neuro: alert and oriented x 3 Skin: warm, dry Psych: affect normal Pelvic exam: Deferred  Assessment & Plan:  1. Breast engorgement, obstetric, delivered with postpartum condition --Breasts engorged with plugged ducts, no evidence of infection at this time  --Pt to go home, use ice on breasts then massage, then pump Q 2 hours until breasts are soft with no plugged ducts.  Found video online to help pt use her brand of pump.   Pt to go to [facility] if chills/fever/n/v develop as abscess is likely if breasts do not empty completely.  --Appt with lactation tomorrow afternoon, pt to take breast pump for assistance   2. Chronic hypertension --BP wnl without medications today, f/u at 4 week visit  02/25/2019, CNM 6:24 PM

## 2019-12-01 NOTE — Patient Instructions (Addendum)
To relief engorged breasts and plugged ducts:  1.  Lie down flat on your back for 20-30 minute 2.  Massage the breasts 3.  Pump with your electric pump each breast 4. Repeat steps 1-3 every 2 hours until breasts are soft with no hard areas   Websites about breastfeeding: Www.llli.org ([employer]) Www.kellymom.com

## 2019-12-23 ENCOUNTER — Ambulatory Visit: Payer: Medicaid Other | Admitting: Women's Health

## 2020-01-07 ENCOUNTER — Telehealth (INDEPENDENT_AMBULATORY_CARE_PROVIDER_SITE_OTHER): Payer: Medicaid Other | Admitting: Obstetrics and Gynecology

## 2020-01-07 ENCOUNTER — Ambulatory Visit: Payer: Medicaid Other | Admitting: Obstetrics and Gynecology

## 2020-01-07 ENCOUNTER — Encounter: Payer: Self-pay | Admitting: Obstetrics and Gynecology

## 2020-01-07 DIAGNOSIS — O10919 Unspecified pre-existing hypertension complicating pregnancy, unspecified trimester: Secondary | ICD-10-CM

## 2020-01-07 DIAGNOSIS — O1093 Unspecified pre-existing hypertension complicating the puerperium: Secondary | ICD-10-CM | POA: Diagnosis not present

## 2020-01-07 MED ORDER — NIFEDIPINE ER OSMOTIC RELEASE 30 MG PO TB24: 30.0000 mg | ORAL_TABLET | Freq: Every day | ORAL | 1 refills | Status: DC

## 2020-01-07 NOTE — Progress Notes (Deleted)
    Post Partum Visit Note  Robyn Cummings is a 25 y.o. [phone] female who presents for a postpartum visit. She is 6 weeks postpartum following a normal spontaneous vaginal delivery.  I have fully reviewed the prenatal and intrapartum course. The delivery was at 39 gestational weeks.  Anesthesia: epidural. Postpartum course has been unremarkable. Baby is doing well. Baby is feeding by bottle - Enfamil. Bleeding no bleeding. Bowel function is normal. Bladder function is normal. Patient is not sexually active. Contraception method is none. Postpartum depression screening: per pt she stated she cannot answer all these silly questions because she does not feel anything and is "just here".   The pregnancy intention screening data noted above was reviewed. Potential methods of contraception were discussed. The patient elected to proceed with {Upstream End Methods:24109}.      The following portions of the patient's history were reviewed and updated as appropriate: allergies, current medications, past family history, past medical history, past social history, past surgical history and problem list.  Review of Systems Pertinent items are noted in HPI.    Objective:  LMP 02/23/2019    General:  alert   Breasts:  {breast exam:1202::"inspection negative, no nipple discharge or bleeding, no masses or nodularity palpable"}  Lungs: {lung exam:16931}  Heart:  {heart exam:5510}  Abdomen: {abdomen exam:16834}   Vulva:  {labia exam:12198}  Vagina: {vagina exam:12200}  Cervix:  {cervix exam:14595}  Corpus: {uterus exam:12215}  Adnexa:  {adnexa exam:12223}  Rectal Exam: {rectal/vaginal exam:12274}        Assessment:    *** postpartum exam. Pap smear {done:10129} at today's visit.   Plan:   Essential components of care per ACOG recommendations:  1.  Mood and well being: Patient with {gen negative/positive:315881} depression screening today. Reviewed local resources for support.  - Patient  {Action; does/does not:19097} use tobacco. ***If using tobacco we discussed reduction and for recently cessation risk of relapse - hx of drug use? {yes/no:20286}  *** If yes, discussed support systems  2. Infant care and feeding:  -Patient currently breastmilk feeding? {yes/no:20286} ***If breastmilk feeding discussed return to work and pumping. If needed, patient was provided letter for work to allow for every 2-3 hr pumping breaks, and to be granted a private location to express breastmilk and refrigerated area to store breastmilk. Reviewed importance of draining breast regularly to support lactation. -Social determinants of health (SDOH) reviewed in EPIC. No concerns***The following needs were identified***  3. Sexuality, contraception and birth spacing - Patient {DOES_DOES STM:19622} want a pregnancy in the next year.  Desired family size is {NUMBER 1-10:22536} children.  - Reviewed forms of contraception in tiered fashion. Patient desired {PLAN CONTRACEPTION:313102} today.   - Discussed birth spacing of 18 months  4. Sleep and fatigue -Encouraged family/partner/community support of 4 hrs of uninterrupted sleep to help with mood and fatigue  5. Physical Recovery  - Discussed patients delivery*** and complications - Patient had a *** degree laceration, perineal healing reviewed. Patient expressed understanding - Patient has urinary incontinence? {yes/no:20286}*** Patient was referred to pelvic floor PT  - Patient {ACTION; IS/IS WLN:98921194} safe to resume physical and sexual activity  6.  Health Maintenance - Last pap smear done 05/18/19 and was normal with negative HPV.   7. ***Chronic Disease - PCP follow up  Kennon Portela, CMA [facility] for [employer], [facility]

## 2020-01-07 NOTE — Addendum Note (Signed)
Addended by: Venia Carbon I on: 01/07/2020 04:27 PM   Modules accepted: Orders

## 2020-01-07 NOTE — Progress Notes (Addendum)
TELEHEALTH POSTPARTUM VISIT ENCOUNTER NOTE  Provider location: [facility] for [facility] at [place]   I connected with@ on 01/07/20 at  4:00 PM EST by telephone at home and verified that I am speaking with the correct person using two identifiers.   I discussed the limitations, risks, security and privacy concerns of performing an evaluation and management service by telephone and the availability of in person appointments. I also discussed with the patient that there may be a patient responsible charge related to this service. The patient expressed understanding and agreed to proceed.  Appointment Date: 01/07/2020  OBGYN Clinic: [facility]    Chief Complaint:  Postpartum Visit   Robyn Cummings is a 25 y.o. [phone] female who presents for a postpartum visit. She is 6 weeks postpartum following a normal spontaneous vaginal delivery.  I have fully reviewed the prenatal and intrapartum course. The delivery was at 70 gestational weeks.  Anesthesia: epidural. Postpartum course has been unremarkable. Baby is doing well. Baby is feeding by bottle - Enfamil. Bleeding no bleeding. Bowel function is normal. Bladder function is normal. Patient is not sexually active. Contraception method is none. Postpartum depression screening: per pt she stated she cannot answer all these silly questions because she does not feel anything and is "just here".   History of Present Illness: Robyn Cummings is a 25 y.o. African-American [phone] (Patient's last menstrual period was 02/23/2019.), seen for the above chief complaint. Her past medical history is significant for chronic hypertension.   She is s/p normal spontaneous vaginal delivery on 39 weeks; she was discharged to home on 11/26/19. Pregnancy complicated by [facility]. Baby is doing well.  Complains of None    Vaginal bleeding or discharge: No  Mode of feeding infant: Bottle Intercourse: No  Contraception: oral contraceptives (estrogen/progesterone) PP  depression s/s: No .  Any bowel or bladder issues: No  Pap smear: no abnormalities (date: 2021)  Review of Systems: Positive for none.  Her 12 point review of systems is negative or as noted in the History of Present Illness.  Patient Active Problem List   Diagnosis Date Noted  . Chorioamnionitis 11/26/2019  . Iron deficiency anemia 11/26/2019  . Tourette's syndrome 11/24/2019  . Vaginal delivery 11/24/2019  . Chronic hypertension during pregnancy, antepartum 11/23/2019  . Chronic hypertension 11/09/2019  . Marginal insertion of umbilical cord affecting management of mother in third trimester 09/15/2019  . Trichomonas infection 05/19/2019  . Supervision of high risk pregnancy, antepartum 05/08/2019  . 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 Cannabis abuse 05/09/2015    Medications Robyn Cummings had no medications administered during this visit. Current Outpatient Medications  Medication Sig Dispense Refill  . acetaminophen (TYLENOL) 325 MG tablet Take 2 tablets (650 mg total) by mouth every 6 (six) hours.    Marland Kitchen albuterol (VENTOLIN HFA) 108 (90 Base) MCG/ACT inhaler Inhale 1-2 puffs into the lungs every 6 (six) hours as needed for wheezing or shortness of breath. (Patient not taking: Reported on 05/08/2019) 6.7 g 0  . Blood Pressure Monitoring (BLOOD PRESSURE KIT) DEVI 1 kit by Does not apply route once a week. Check Blood Pressure regularly and record readings into the Babyscripts App.  Large Cuff.  DX O90.0 1 each 0  . coconut oil OIL Apply 1 application topically as needed. (Patient not taking: Reported on 12/01/2019)  0  . Elastic Bandages & Supports (COMFORT FIT MATERNITY SUPP LG) MISC 1 Units by Does not apply route daily. 1 each 0  . ferrous sulfate (FERROUSUL)  325 (65 FE) MG tablet Take 1 tablet (325 mg total) by mouth every other day. 30 tablet 2  . ibuprofen (ADVIL) 600 MG tablet Take 1 tablet (600 mg total) by mouth every 6 (six) hours. 30 tablet 0  . norethindrone (ORTHO  MICRONOR) 0.35 MG tablet Take 1 tablet (0.35 mg total) by mouth daily. (Patient not taking: Reported on 12/01/2019) 28 tablet 11  . Prenat-Fe Poly-Methfol-FA-DHA (VITAFOL FE+) 90-0.6-0.4-200 MG CAPS Take 1 capsule by mouth at bedtime.  (Patient not taking: Reported on 12/01/2019)    . vitamin C (ASCORBIC ACID) 250 MG tablet Take 1 tablet (250 mg total) by mouth every other day. Take with iron. (Patient not taking: Reported on 12/01/2019)     No current facility-administered medications for this visit.    Allergies Mushroom extract complex; Lime [lime, sulfurated]; and Spinach  Physical Exam:  General:  Alert, oriented and cooperative.   Mental Status: Normal mood and affect perceived. Normal judgment and thought content.  Rest of physical exam deferred due to type of encounter  PP Depression Screening:    Edinburgh Postnatal Depression Scale - 01/07/20 0918      Edinburgh Postnatal Depression Scale:  In the Past 7 Days   I have been able to laugh and see the funny side of things. 1    I have looked forward with enjoyment to things. 1    I have blamed myself unnecessarily when things went wrong. --   per pt cannot answer   I have been anxious or worried for no good reason. --   per pt cannot really answer these silly questions because she is "just here'          Assessment:Patient is a 25 y.o. [id number] who is 6 weeks postpartum from a normal spontaneous vaginal delivery.  She is doing very well.  BP mildly elevated today 141/84 & 139/101. She has no headache today or scotoma. She had CHTN vs. GHTN. No medications.  Discussed depression and anxiety symptoms. Patient reports not having a lot of feelings right now. No thoughts of harm to herself or baby.   Plan:  Message sent to [facility] for virtual visit. No immediate concerns today, however patient does not have a lot of outside support.  Rx: Procardia 30 mg XL, discussed importance of establishing with a primary care office.   RTC as  needed.   I discussed the assessment and treatment plan with the patient. The patient was provided an opportunity to ask questions and all were answered. The patient agreed with the plan and demonstrated an understanding of the instructions.   The patient was advised to call back or seek an in-person evaluation/go to the ED for any concerning postpartum symptoms.  I provided 12 minutes of non-face-to-face time during this encounter.   Noni Saupe, NP [facility] for [employer] [facility], [place]

## 2020-04-26 ENCOUNTER — Other Ambulatory Visit: Payer: Self-pay

## 2020-04-26 ENCOUNTER — Ambulatory Visit (INDEPENDENT_AMBULATORY_CARE_PROVIDER_SITE_OTHER): Payer: Medicaid Other

## 2020-04-26 ENCOUNTER — Encounter (HOSPITAL_COMMUNITY): Payer: Self-pay

## 2020-04-26 ENCOUNTER — Ambulatory Visit (HOSPITAL_COMMUNITY)
Admission: EM | Admit: 2020-04-26 | Discharge: 2020-04-26 | Disposition: A | Discharge status: Home / Self Care | Payer: Medicaid Other | Attending: Emergency Medicine | Admitting: Emergency Medicine

## 2020-04-26 DIAGNOSIS — X500XXA Overexertion from strenuous movement or load, initial encounter: Secondary | ICD-10-CM | POA: Diagnosis not present

## 2020-04-26 DIAGNOSIS — M25572 Pain in left ankle and joints of left foot: Secondary | ICD-10-CM

## 2020-04-26 DIAGNOSIS — W19XXXA Unspecified fall, initial encounter: Secondary | ICD-10-CM | POA: Diagnosis not present

## 2020-04-26 DIAGNOSIS — S93402A Sprain of unspecified ligament of left ankle, initial encounter: Secondary | ICD-10-CM | POA: Diagnosis not present

## 2020-04-26 MED ORDER — NAPROXEN 500 MG PO TABS: 500.0000 mg | ORAL_TABLET | Freq: Two times a day (BID) | ORAL | 0 refills | Status: DC

## 2020-04-26 NOTE — Discharge Instructions (Addendum)
Take the naproxen as needed for pain.   RICE: Rest as much as possible Ice for 10-15 minutes every 4-6 hours as needed for pain and swelling Compression- use an ace bandage or splint for comfort Elevate above your hip when sitting and standing  Follow up with an orthopedic or sports medicine doctor if your symptoms do not improve in the next few days.   Return or go to the Emergency Department if symptoms worsen or do not improve in the next few days.

## 2020-04-26 NOTE — ED Triage Notes (Addendum)
Pt states she twisted her left ankle on a cord one week ago. States the cord sparked and it startled her so she twisted away, turning her ankle (pt states does not remember cord shocking her in any way). 2+ pedal pulse. Pt with pain and tenderness in foot and ankle up lower leg. Pt with swollen spot to medial calf area, uncertain if related.   Pt needs refills of some of her medication. She is uncertain what she takes but states she thinks she may still be on her procardia, but thinks needs refill. Needs refill for iron supplement. She states she has been taking a muscle relaxer but cannot remember the name.

## 2020-04-26 NOTE — ED Provider Notes (Signed)
[place]    CSN: [id number] Arrival date & time: 04/26/20  1414      History   Chief Complaint Chief Complaint  Patient presents with  . Ankle Pain    HPI Robyn Cummings is a 25 y.o. female.   Patient here for evaluation of left ankle/foot pain following fall that occurred last week.  Reports twisting foot/ankle and developing pain.  Has not tried any OTC medications.  Reports pain is achy and constant.  Reports pain worse with movement and improves with rest.  Reports decreased ROM.   Denies any fevers, chest pain, shortness of breath, N/V/D, numbness, tingling, weakness, abdominal pain, or headaches.   ROS: As per HPI, all other pertinent ROS negative    The history is provided by the patient.    Past Medical History:  Diagnosis Date  . ADHD (attention deficit hyperactivity disorder)   . Anaphylactic reaction   . Anemia   . Anxiety   . Asthma   . Bipolar 1 disorder ([place])   . Chronic back pain   . Dyspnea   . Hypertension   . Infection    UTI  . Migraine   . Polyhydramnios affecting pregnancy 09/15/2019   Followed by MFM  . Seizures ([place])    has not had a seizure since childhood, no medications currently  . Sleep disorder   . Vaginal Pap smear, abnormal     Patient Active Problem List   Diagnosis Date Noted  . Postpartum state 01/07/2020  . Chorioamnionitis 11/26/2019  . Iron deficiency anemia 11/26/2019  . Tourette's syndrome 11/24/2019  . Vaginal delivery 11/24/2019  . Chronic hypertension during pregnancy, antepartum 11/23/2019  . Chronic hypertension 11/09/2019  . Marginal insertion of umbilical cord affecting management of mother in third trimester 09/15/2019  . Trichomonas infection 05/19/2019  . Supervision of high risk pregnancy, antepartum 05/08/2019  . 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 Cannabis abuse 05/09/2015    Past Surgical History:  Procedure Laterality Date  . NO PAST SURGERIES      OB History    Gravida  4    Para  1   Term  1   Preterm  0   AB  3   Living  1     SAB  3   IAB  0   Ectopic  0   Multiple  0   Live Births  1            Home Medications    Prior to Admission medications   Medication Sig Start Date End Date Taking? Authorizing Provider  acetaminophen (TYLENOL) 325 MG tablet Take 2 tablets (650 mg total) by mouth every 6 (six) hours. 11/26/19  Yes Arrie Senate, MD  naproxen (NAPROSYN) 500 MG tablet Take 1 tablet (500 mg total) by mouth 2 (two) times daily. 04/26/20  Yes Pearson Forster, NP  albuterol (VENTOLIN HFA) 108 (90 Base) MCG/ACT inhaler Inhale 1-2 puffs into the lungs every 6 (six) hours as needed for wheezing or shortness of breath. Patient not taking: Reported on 05/08/2019 11/06/18   Petrucelli, Glynda Jaeger, PA-C  Blood Pressure Monitoring (BLOOD PRESSURE KIT) DEVI 1 kit by Does not apply route once a week. Check Blood Pressure regularly and record readings into the Babyscripts App.  Large Cuff.  DX O90.0 05/08/19   Sloan Leiter, MD  coconut oil OIL Apply 1 application topically as needed. Patient not taking: Reported on 12/01/2019 62/70/35   Arrie Senate, MD  Elastic Bandages & Supports (COMFORT FIT MATERNITY SUPP LG) MISC 1 Units by Does not apply route daily. 11/10/19   Julianne Handler, CNM  ferrous sulfate (FERROUSUL) 325 (65 FE) MG tablet Take 1 tablet (325 mg total) by mouth every other day. 09/15/19   Leftwich-Kirby, Kathie Dike, CNM  ibuprofen (ADVIL) 600 MG tablet Take 1 tablet (600 mg total) by mouth every 6 (six) hours. 29/92/42   Arrie Senate, MD  NIFEdipine (PROCARDIA-XL/NIFEDICAL-XL) 30 MG 24 hr tablet Take 1 tablet (30 mg total) by mouth daily. 01/07/20   Rasch, Anderson Malta I, NP  norethindrone (ORTHO MICRONOR) 0.35 MG tablet Take 1 tablet (0.35 mg total) by mouth daily. Patient not taking: Reported on 12/01/2019 11/26/19 68/34/19  Arrie Senate, MD  Prenat-Fe Poly-Methfol-FA-DHA (VITAFOL FE+) 90-0.6-0.4-200 MG CAPS Take 1 capsule  by mouth at bedtime.  Patient not taking: Reported on 12/01/2019 09/04/19   [provider]  vitamin C (ASCORBIC ACID) 250 MG tablet Take 1 tablet (250 mg total) by mouth every other day. Take with iron. Patient not taking: Reported on 12/01/2019 62/22/97   Arrie Senate, MD    Family History Family History  Problem Relation Age of Onset  . Anxiety disorder Mother   . Arthritis Father   . Asthma Father   . Cancer Maternal Aunt   . Diabetes Maternal Uncle   . Cancer Paternal Uncle   . Diabetes Paternal Grandmother   . Stroke Paternal Grandmother     Social History Social History   Tobacco Use  . Smoking status: Current Every Day Smoker    Packs/day: 0.25    Types: Cigarettes    Last attempt to quit: 11/02/2019    Years since quitting: 0.4  . Smokeless tobacco: Never Used  . Tobacco comment: states a pack every three days  Vaping Use  . Vaping Use: Never used  Substance Use Topics  . Alcohol use: No  . Drug use: Not Currently    Types: Marijuana    Comment: last was July 2021     Allergies   Mushroom extract complex; Lime [lime, sulfurated]; and Spinach   Review of Systems Review of Systems  Musculoskeletal: Positive for joint swelling and myalgias.  All other systems reviewed and are negative.    Physical Exam Triage Vital Signs ED Triage Vitals  Enc Vitals Group     BP 04/26/20 1537 128/83     Pulse Rate 04/26/20 1537 75     Resp 04/26/20 1537 18     Temp 04/26/20 1537 98 F (36.7 C)     Temp src --      SpO2 04/26/20 1537 100 %     Weight --      Height --      Head Circumference --      Peak Flow --      Pain Score 04/26/20 1530 3     Pain Loc --      Pain Edu? --      Excl. in [place]? --    No data found.  Updated Vital Signs BP 128/83   Pulse 75   Temp 98 F (36.7 C)   Resp 18   LMP 04/23/2020   SpO2 100%   Visual Acuity Right Eye Distance:   Left Eye Distance:   Bilateral Distance:    Right Eye Near:   Left Eye Near:     Bilateral Near:     Physical Exam Vitals and nursing note reviewed.  Constitutional:  General: She is not in acute distress.    Appearance: Normal appearance. She is not ill-appearing, toxic-appearing or diaphoretic.  HENT:     Head: Normocephalic and atraumatic.  Eyes:     Conjunctiva/sclera: Conjunctivae normal.  Cardiovascular:     Rate and Rhythm: Normal rate.     Pulses: Normal pulses.  Pulmonary:     Effort: Pulmonary effort is normal.  Abdominal:     General: Abdomen is flat.  Musculoskeletal:     Cervical back: Normal range of motion.     Left lower leg: Tenderness (below knee, full ROM) present. No swelling or bony tenderness. Edema present.     Left ankle: No deformity. Tenderness present. Decreased range of motion (decreased active ROM but full passive ROM). Anterior drawer test negative. Normal pulse.     Left Achilles Tendon: Normal.  Skin:    General: Skin is warm and dry.  Neurological:     General: No focal deficit present.     Mental Status: She is alert and oriented to person, place, and time.  Psychiatric:        Mood and Affect: Mood normal.      UC Treatments / Results  Labs (all labs ordered are listed, but only abnormal results are displayed) Labs Reviewed - No data to display  EKG   Radiology DG Ankle Complete Left  Result Date: 04/26/2020 CLINICAL DATA:  Fall with ankle pain.  Twisting injury 1 week ago. EXAM: LEFT ANKLE COMPLETE - 3+ VIEW COMPARISON:  None. FINDINGS: There is no evidence of fracture, dislocation, or joint effusion. Ankle mortise is preserved. There is no evidence of arthropathy or other focal bone abnormality. Mild generalized soft tissue edema. IMPRESSION: Mild soft tissue edema. No fracture or subluxation. Electronically Signed   By: Keith Rake M.D.   On: 04/26/2020 16:34    Procedures Procedures (including critical care time)  Medications Ordered in UC Medications - No data to display  Initial Impression  / Assessment and Plan / UC Course  I have reviewed the triage vital signs and the nursing notes.  Pertinent labs & imaging results that were available during my care of the patient were reviewed by me and considered in my medical decision making (see chart for details).     Assessment negative for red flags or concerns.  X-ray with no fracture or bony abnormalities but does show some mild soft tissue swelling.  Naproxen twice daily as needed for pain and RICE.  If symptoms do not improve over the next few days may consider following up with orthopedics or sports medicine.  Follow-up with primary care as needed.   Final Clinical Impressions(s) / UC Diagnoses   Final diagnoses:  Sprain of left ankle, unspecified ligament, initial encounter  Pain of joint of left ankle and foot     Discharge Instructions     Take the naproxen as needed for pain.   RICE: Rest as much as possible Ice for 10-15 minutes every 4-6 hours as needed for pain and swelling Compression- use an ace bandage or splint for comfort Elevate above your hip when sitting and standing  Follow up with an orthopedic or sports medicine doctor if your symptoms do not improve in the next few days.   Return or go to the Emergency Department if symptoms worsen or do not improve in the next few days.      ED Prescriptions    Medication Sig Dispense Auth. Provider   naproxen (NAPROSYN) 500  MG tablet Take 1 tablet (500 mg total) by mouth 2 (two) times daily. 30 tablet Pearson Forster, NP     PDMP not reviewed this encounter.   Pearson Forster, NP 04/26/20 [phone]

## 2020-09-24 ENCOUNTER — Emergency Department (HOSPITAL_COMMUNITY)
Admission: EM | Admit: 2020-09-24 | Discharge: 2020-09-24 | Disposition: A | Discharge status: Home / Self Care | Payer: Medicaid Other | Attending: Emergency Medicine | Admitting: Emergency Medicine

## 2020-09-24 ENCOUNTER — Other Ambulatory Visit: Payer: Self-pay

## 2020-09-24 ENCOUNTER — Telehealth (HOSPITAL_COMMUNITY): Payer: Self-pay | Admitting: Emergency Medicine

## 2020-09-24 ENCOUNTER — Encounter (HOSPITAL_COMMUNITY): Payer: Self-pay | Admitting: Emergency Medicine

## 2020-09-24 DIAGNOSIS — N9489 Other specified conditions associated with female genital organs and menstrual cycle: Secondary | ICD-10-CM | POA: Insufficient documentation

## 2020-09-24 DIAGNOSIS — J45909 Unspecified asthma, uncomplicated: Secondary | ICD-10-CM | POA: Insufficient documentation

## 2020-09-24 DIAGNOSIS — N939 Abnormal uterine and vaginal bleeding, unspecified: Secondary | ICD-10-CM | POA: Insufficient documentation

## 2020-09-24 DIAGNOSIS — I10 Essential (primary) hypertension: Secondary | ICD-10-CM | POA: Diagnosis not present

## 2020-09-24 DIAGNOSIS — F17210 Nicotine dependence, cigarettes, uncomplicated: Secondary | ICD-10-CM | POA: Diagnosis not present

## 2020-09-24 DIAGNOSIS — Z79899 Other long term (current) drug therapy: Secondary | ICD-10-CM | POA: Diagnosis not present

## 2020-09-24 LAB — CBC WITH DIFFERENTIAL/PLATELET
Abs Immature Granulocytes: 0.04 10*3/uL (ref 0.00–0.07)
Basophils Absolute: 0.1 10*3/uL (ref 0.0–0.1)
Basophils Relative: 1 %
Eosinophils Absolute: 0.4 10*3/uL (ref 0.0–0.5)
Eosinophils Relative: 4 %
HCT: 36.5 % (ref 36.0–46.0)
Hemoglobin: 11.3 g/dL — ABNORMAL LOW (ref 12.0–15.0)
Immature Granulocytes: 0 %
Lymphocytes Relative: 23 %
Lymphs Abs: 2.5 10*3/uL (ref 0.7–4.0)
MCH: 25.5 pg — ABNORMAL LOW (ref 26.0–34.0)
MCHC: 31 g/dL (ref 30.0–36.0)
MCV: 82.4 fL (ref 80.0–100.0)
Monocytes Absolute: 0.5 10*3/uL (ref 0.1–1.0)
Monocytes Relative: 5 %
Neutro Abs: 7.1 10*3/uL (ref 1.7–7.7)
Neutrophils Relative %: 67 %
Platelets: 320 10*3/uL (ref 150–400)
RBC: 4.43 MIL/uL (ref 3.87–5.11)
RDW: 16.7 % — ABNORMAL HIGH (ref 11.5–15.5)
WBC: 10.7 10*3/uL — ABNORMAL HIGH (ref 4.0–10.5)
nRBC: 0 % (ref 0.0–0.2)

## 2020-09-24 LAB — BASIC METABOLIC PANEL
Anion gap: 7 (ref 5–15)
BUN: 14 mg/dL (ref 6–20)
CO2: 23 mmol/L (ref 22–32)
Calcium: 9.2 mg/dL (ref 8.9–10.3)
Chloride: 109 mmol/L (ref 98–111)
Creatinine, Ser: 0.74 mg/dL (ref 0.44–1.00)
GFR, Estimated: >60 mL/min (ref >60)
Glucose, Bld: 101 mg/dL — ABNORMAL HIGH (ref 70–99)
Potassium: 3.9 mmol/L (ref 3.5–5.1)
Sodium: 139 mmol/L (ref 135–145)

## 2020-09-24 LAB — I-STAT BETA HCG BLOOD, ED (MC, WL, AP ONLY): I-stat hCG, quantitative: <5 m[IU]/mL (ref <5)

## 2020-09-24 MED ORDER — NORETHINDRONE ACET-ETHINYL EST 1-20 MG-MCG PO TABS: 1.0000 | ORAL_TABLET | Freq: Every day | ORAL | 0 refills | Status: DC

## 2020-09-24 MED ORDER — NORETHINDRONE-ETH ESTRADIOL 1-5 MG-MCG PO TABS
1.0000 | ORAL_TABLET | Freq: Every day | ORAL | 1 refills | Status: DC
Start: 2020-09-24 — End: 2020-09-24

## 2020-09-24 NOTE — ED Provider Notes (Addendum)
Emergency Medicine Provider Triage Evaluation Note  Robyn Cummings , a 25 y.o. female  was evaluated in triage.  Pt complains of persistent vaginal bleeding for the past 10 days.  Started out as her period on 09/15/2020 but has persisted.  Reports moderate amount of bleeding.  Denies any pelvic pain, vaginal discharge, dysuria.  She is not on any hormone pills.  Review of Systems  Positive: Vaginal bleeding Negative: Pelvic pain  Physical Exam  BP 135/83 (BP Location: Left Arm)   Pulse 86   Temp 98.7 F (37.1 C) (Oral)   Resp 16   SpO2 98%  Gen:   Awake, no distress   Resp:  Normal effort  MSK:   Moves extremities without difficulty  Other:  Abdomen is soft  Medical Decision Making  Medically screening exam initiated at 10:02 AM.  Appropriate orders placed.  Robyn Cummings was informed that the remainder of the evaluation will be completed by another provider, this initial triage assessment does not replace that evaluation, and the importance of remaining in the ED until their evaluation is complete.  Lab work and hCG ordered   Robyn Pates, PA-C 09/24/20 1004    Robyn Pates, PA-C 09/24/20 1004    Robyn Cummings, [place] 09/26/20 [phone]

## 2020-09-24 NOTE — Discharge Instructions (Addendum)
You were seen in the emergency department today for vaginal bleeding.  While you were here we did some basic blood work which showed that you are not anemic.  You are also not pregnant.  We did a vaginal exam and it does not appear that you are having excessive bleeding from your cervix.  I have prescribed you birth control for you to take once daily which should control your periods.  Please return to the emergency department if you have increasing vaginal blood flow that is not responsive to your birth control, you begin to feel lightheaded or dizzy.  Please also follow-up with OB/GYN for continued care.  If you do not have 1 I have given you the information for [facility] which is a primary care facility that can put you in touch with an OB/GYN.  You may also return to the emergency department for any reason.

## 2020-09-24 NOTE — Telephone Encounter (Signed)
Pharmacy requests alternative OCP

## 2020-09-24 NOTE — ED Triage Notes (Signed)
Pt reports vaginal bleeding x 3-4 days with clots.  States she is spotting right now.  Used same pad all morning. States her LMP was last Thursday and went away and then came back.  Denies pain.

## 2020-09-24 NOTE — ED Provider Notes (Signed)
[facility] EMERGENCY DEPARTMENT Provider Note   CSN: [id number] Arrival date & time: 09/24/20  5329     History No chief complaint on file.   Robyn Cummings is a G2, P2 25 y.o. female.  With past medical history of bipolar, anxiety, hypertension presents to the emergency department vaginal bleeding.  She states that she started her period on 09/15/2020.  States that she had normal flow to amount of bleeding.  States that she used 18 pads over the 8 day period.  She states she stopped bleeding on 09/21/2020.  She did not bleed on 09/22/2020.  She then began having spotting on 09/23/2020.  States that she has had intermittent spotting since then and is concerned because this is not usual for her.  She denies chest pain, shortness of breath, lightheadedness or dizziness, syncope, visual changes.  Denies urinary symptoms or other vaginal discharge concerning for STI.  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   Iron deficiency anemia 11/26/2019   Tourette's syndrome 11/24/2019   Vaginal delivery 11/24/2019   Chronic hypertension during pregnancy, antepartum 11/23/2019   Chronic hypertension 11/09/2019   Marginal insertion of umbilical cord affecting management of mother in third trimester 09/15/2019   Trichomonas infection 05/19/2019   Supervision of high risk pregnancy, antepartum 05/08/2019   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    Cannabis abuse 05/09/2015    Past Surgical History:  Procedure Laterality Date   NO PAST SURGERIES       OB History     Gravida  4   Para  1   Term  1   Preterm  0   AB  3   Living  1      SAB  3   IAB  0   Ectopic  0   Multiple  0   Live Births  1           Family History  Problem Relation Age of Onset   Anxiety disorder Mother    Arthritis Father    Asthma Father    Cancer Maternal Aunt    Diabetes Maternal Uncle    Cancer Paternal Uncle    Diabetes Paternal Grandmother    Stroke Paternal Grandmother     Social History   Tobacco Use   Smoking status: Every Day    Packs/day: 0.25    Types: Cigarettes    Last attempt to quit: 11/02/2019    Years since quitting: 0.8   Smokeless tobacco: Never   Tobacco comments:    states a pack every three days  Vaping Use   Vaping Use: Never used  Substance Use Topics   Alcohol use: No   Drug use: Not Currently    Types: Marijuana    Comment: last was July 2021  Home Medications Prior to Admission medications   Medication Sig Start Date End Date Taking? Authorizing Provider  acetaminophen (TYLENOL) 325 MG tablet Take 2 tablets (650 mg total) by mouth every 6 (six) hours. 38/18/29   Arrie Senate, MD  albuterol (VENTOLIN HFA) 108 (90 Base) MCG/ACT inhaler Inhale 1-2 puffs into the lungs every 6 (six) hours as needed for wheezing or shortness of breath. Patient not taking: Reported on 05/08/2019 11/06/18   Petrucelli, Glynda Jaeger, PA-C  Blood Pressure Monitoring (BLOOD PRESSURE KIT) DEVI 1 kit by Does not apply route once a week. Check Blood Pressure regularly and record readings into the Babys[employer]s App.  Large Cuff.  DX O90.0 05/08/19   Sloan Leiter, MD  coconut oil OIL Apply 1 application topically as needed. Patient not taking: Reported on 12/01/2019 93/71/69   Arrie Senate, MD  Elastic Bandages & Supports (COMFORT FIT MATERNITY SUPP LG) MISC 1 Units by Does not apply route daily. 11/10/19    Julianne Handler, CNM  ferrous sulfate (FERROUSUL) 325 (65 FE) MG tablet Take 1 tablet (325 mg total) by mouth every other day. 09/15/19   Leftwich-Kirby, Kathie Dike, CNM  ibuprofen (ADVIL) 600 MG tablet Take 1 tablet (600 mg total) by mouth every 6 (six) hours. 67/89/38   Arrie Senate, MD  naproxen (NAPROSYN) 500 MG tablet Take 1 tablet (500 mg total) by mouth 2 (two) times daily. 04/26/20   Pearson Forster, NP  NIFEdipine (PROCARDIA-XL/NIFEDICAL-XL) 30 MG 24 hr tablet Take 1 tablet (30 mg total) by mouth daily. 01/07/20   Rasch, Anderson Malta I, NP  norethindrone (ORTHO MICRONOR) 0.35 MG tablet Take 1 tablet (0.35 mg total) by mouth daily. Patient not taking: Reported on 12/01/2019 11/26/19 10/17/49  Arrie Senate, MD  Prenat-Fe Poly-Methfol-FA-DHA (VITAFOL FE+) 90-0.6-0.4-200 MG CAPS Take 1 capsule by mouth at bedtime.  Patient not taking: Reported on 12/01/2019 09/04/19   [provider]  vitamin C (ASCORBIC ACID) 250 MG tablet Take 1 tablet (250 mg total) by mouth every other day. Take with iron. Patient not taking: Reported on 12/01/2019 02/58/52   Arrie Senate, MD    Allergies    Mushroom extract complex; Lime [lime, sulfurated]; and Spinach  Review of Systems   Review of Systems  Genitourinary:  Positive for vaginal bleeding.  All other systems reviewed and are negative.  Physical Exam Updated Vital Signs BP (!) 136/97   Pulse (!) 145   Temp 98.7 F (37.1 C) (Oral)   Resp 16   SpO2 97%   Physical Exam Vitals and nursing note reviewed. Exam conducted with a chaperone present.  Constitutional:      General: She is not in acute distress.    Appearance: Normal appearance. She is not toxic-appearing.  HENT:     Head: Normocephalic and atraumatic.  Eyes:     General: No scleral icterus.    Conjunctiva/sclera: Conjunctivae normal.     Pupils: Pupils are equal, round, and reactive to light.  Cardiovascular:     Rate and Rhythm: Normal rate and regular rhythm.      Pulses: Normal pulses.     Heart sounds: Normal heart sounds.  Pulmonary:     Effort: Pulmonary effort is normal. No respiratory distress.     Breath sounds: Normal breath sounds.  Abdominal:     General: Bowel sounds are normal. There is no distension.     Palpations: Abdomen is soft.     Tenderness: There is no abdominal tenderness.  Genitourinary:    General: Normal vulva.     Exam position: Lithotomy position.     Vagina: Normal.     Cervix: No cervical bleeding.     Comments: Scant amount of brown old blood in vagina.  Os is closed. Skin:    General: Skin is warm and dry.     Capillary Refill: Capillary refill takes less than 2 seconds.     Findings: No rash.  Neurological:     General: No focal deficit present.     Mental Status: She is alert and oriented to person, place, and time.  Psychiatric:        Mood and Affect: Mood normal.        Behavior: Behavior normal.        Thought Content: Thought content normal.        Judgment: Judgment normal.    ED Results / Procedures / Treatments   Labs (all labs ordered are listed, but only abnormal results are displayed) Labs Reviewed  BASIC METABOLIC PANEL - Abnormal; Notable for the following components:      Result Value   Glucose, Bld 101 (*)    All other components within normal limits  CBC WITH DIFFERENTIAL/PLATELET - Abnormal; Notable for the following components:   WBC 10.7 (*)    Hemoglobin 11.3 (*)    MCH 25.5 (*)    RDW 16.7 (*)    All other components within normal limits  I-STAT BETA HCG BLOOD, ED ([facility], [facility], [facility] ONLY)    EKG None  Radiology No results found.  Procedures Procedures   Medications Ordered in ED Medications - No data to display  ED Course  I have reviewed the triage vital signs and the nursing notes.  Pertinent labs & imaging results that were available during my care of the patient were reviewed by me and considered in my medical decision making (see chart for details).    MDM  Rules/Calculators/A&P 25 year old female with no significant past medical history presents emergency department for vaginal bleeding. Not pregnant CBC without anemia  Declined STI testing during pelvic exam, however, doubt STI or sequelae including PID, TOA.  Presentation not consistent with ovarian torsion.  Physical exam is unremarkable and pelvic exam reassuring as there is no active bleeding from the cervix or vagina.  Hemodynamically stable.  I have prescribed her birth control pills for her to take to control periods. I have encouraged her to follow-up with OBGYN and she is agreeable to plan. VSS. She is safe for discharge.  Final Clinical Impression(s) / ED Diagnoses Final diagnoses:  Vaginal bleeding    Rx / DC Orders ED Discharge Orders          Ordered    norethindrone-ethinyl estradiol (FEMHRT 1/5) 1-5 MG-MCG TABS tablet  Daily,   Status:  Discontinued        09/24/20 1320             Mickie Hillier, PA-C 09/25/20 0700    Horton, Alvin Critchley, DO 09/26/20 [phone]

## 2021-05-03 ENCOUNTER — Ambulatory Visit (HOSPITAL_COMMUNITY)
Admission: RE | Admit: 2021-05-03 | Discharge: 2021-05-03 | Disposition: A | Discharge status: Home / Self Care | Payer: Medicaid Other | Source: Ambulatory Visit | Attending: Emergency Medicine | Admitting: Emergency Medicine

## 2021-05-03 ENCOUNTER — Other Ambulatory Visit: Payer: Self-pay

## 2021-05-03 ENCOUNTER — Encounter (HOSPITAL_COMMUNITY): Payer: Self-pay

## 2021-05-03 VITALS — BP 108/78 | HR 107 | Temp 99.6°F | Resp 18

## 2021-05-03 DIAGNOSIS — R1030 Lower abdominal pain, unspecified: Secondary | ICD-10-CM | POA: Diagnosis not present

## 2021-05-03 DIAGNOSIS — R112 Nausea with vomiting, unspecified: Secondary | ICD-10-CM

## 2021-05-03 MED ORDER — ONDANSETRON 4 MG PO TBDP: 4.0000 mg | ORAL_TABLET | Freq: Three times a day (TID) | ORAL | 0 refills | Status: DC | PRN

## 2021-05-03 MED ORDER — AZITHROMYCIN 250 MG PO TABS: 250.0000 mg | ORAL_TABLET | Freq: Every day | ORAL | 0 refills | Status: DC

## 2021-05-03 MED ORDER — DICYCLOMINE HCL 20 MG PO TABS: 20.0000 mg | ORAL_TABLET | Freq: Two times a day (BID) | ORAL | 0 refills | Status: DC

## 2021-05-03 NOTE — ED Provider Notes (Signed)
?[facility] ? ? ? ?CSN: [id number] ?Arrival date & time: 05/03/21  1703 ? ? ?  ? ?History   ?Chief Complaint ?Chief Complaint  ?Patient presents with  ? Appointment  ?  5:00 pm  ? Abdominal Pain  ? ? ?HPI ?Robyn Cummings is a 26 y.o. female.  ? ?Presents with constant lower abdominal pain and intermittent vomiting for 5 days.  Last episode of vomiting 2 days ago, emesis is described as clear.  Abdominal pain is described as sharp and cramping.  Tolerating food and liquids.  No known sick contacts.  Symptoms began after masturbation.  Denies fever, chills diarrhea, changes in diet or recent travel, heartburn or indigestion, bloating, increased gas production.  Has not attempted treatment of symptoms .  ? ?Past Medical History:  ?Diagnosis Date  ? ADHD (attention deficit hyperactivity disorder)   ? Anaphylactic reaction   ? Anemia   ? Anxiety   ? Asthma   ? Bipolar 1 disorder ([place])   ? Chronic back pain   ? Dyspnea   ? Hypertension   ? Infection   ? UTI  ? Migraine   ? Polyhydramnios affecting pregnancy 09/15/2019  ? Followed by MFM  ? Seizures ([place])   ? has not had a seizure since childhood, no medications currently  ? Sleep disorder   ? Vaginal Pap smear, abnormal   ? ? ?Patient Active Problem List  ? Diagnosis Date Noted  ? Postpartum state 01/07/2020  ? 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 Iron deficiency anemia 11/26/2019  ? Tourette's syndrome 11/24/2019  ? Vaginal delivery 11/24/2019  ? Chronic hypertension during pregnancy, antepartum 11/23/2019  ? Chronic hypertension 11/09/2019  ? Marginal insertion of umbilical cord affecting management of mother in third trimester 09/15/2019  ? Trichomonas infection 05/19/2019  ? Supervision of high risk pregnancy, antepartum 05/08/2019  ? 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 Cannabis abuse 05/09/2015  ? ? ?Past Surgical History:  ?Procedure Laterality Date  ? NO PAST SURGERIES    ? ? ?OB History   ? ? Gravida  ?4  ? Para  ?1  ? Term  ?1  ? Preterm  ?0  ? AB  ?3  ?  Living  ?1  ?  ? ? SAB  ?3  ? IAB  ?0  ? Ectopic  ?0  ? Multiple  ?0  ? Live Births  ?1  ?   ?  ?  ? ? ? ?Home Medications   ? ?Prior to Admission medications   ?Medication Sig Start Date End Date Taking? Authorizing Provider  ?acetaminophen (TYLENOL) 325 MG tablet Take 2 tablets (650 mg total) by mouth every 6 (six) hours. 77/93/90   Arrie Senate, MD  ?albuterol (VENTOLIN HFA) 108 (90 Base) MCG/ACT inhaler Inhale 1-2 puffs into the lungs every 6 (six) hours as needed for wheezing or shortness of breath. ?Patient not taking: Reported on 05/08/2019 11/06/18   Petrucelli, Glynda Jaeger, PA-C  ?Blood Pressure Monitoring (BLOOD PRESSURE KIT) DEVI 1 kit by Does not apply route once a week. Check Blood Pressure regularly and record readings into the Bab[employer] App.  Large Cuff.  DX O90.0 05/08/19   Sloan Leiter, MD  ?coconut oil OIL Apply 1 application topically as needed. ?Patient not taking: Reported on 12/01/2019 30/09/23   Arrie Senate, MD  ?Elastic Bandages & Supports (COMFORT FIT MATERNITY SUPP LG) MISC 1 Units by Does not apply route daily. 11/10/19   Julianne Handler, CNM  ?ferrous sulfate (FERROUSUL) 325 (  65 FE) MG tablet Take 1 tablet (325 mg total) by mouth every other day. 09/15/19   Leftwich-Kirby, Kathie Dike, CNM  ?ibuprofen (ADVIL) 600 MG tablet Take 1 tablet (600 mg total) by mouth every 6 (six) hours. 25/49/82   Arrie Senate, MD  ?naproxen (NAPROSYN) 500 MG tablet Take 1 tablet (500 mg total) by mouth 2 (two) times daily. ?Patient not taking: Reported on 05/03/2021 04/26/20   Pearson Forster, NP  ?NIFEdipine (PROCARDIA-XL/NIFEDICAL-XL) 30 MG 24 hr tablet Take 1 tablet (30 mg total) by mouth daily. 01/07/20   Rasch, Anderson Malta I, NP  ?norethindrone-ethinyl estradiol (LOESTRIN 1/20, 21,) 1-20 MG-MCG tablet Take 1 tablet by mouth daily. ?Patient not taking: Reported on 05/03/2021 09/24/20   Carmin Muskrat, MD  ?Prenat-Fe Poly-Methfol-FA-DHA (VITAFOL FE+) 90-0.6-0.4-200 MG CAPS Take 1 capsule by mouth at  bedtime.  ?Patient not taking: Reported on 12/01/2019 09/04/19   [provider]  ?vitamin C (ASCORBIC ACID) 250 MG tablet Take 1 tablet (250 mg total) by mouth every other day. Take with iron. ?Patient not taking: Reported on 12/01/2019 64/15/83   Arrie Senate, MD  ? ? ?Family History ?Family History  ?Problem Relation Age of Onset  ? Anxiety disorder Mother   ? Arthritis Father   ? Asthma Father   ? Cancer Maternal Aunt   ? Diabetes Maternal Uncle   ? Cancer Paternal Uncle   ? Diabetes Paternal Grandmother   ? Stroke Paternal Grandmother   ? ? ?Social History ?Social History  ? ?Tobacco Use  ? Smoking status: Every Day  ?  Packs/day: 0.25  ?  Types: Cigarettes  ?  Last attempt to quit: 11/02/2019  ?  Years since quitting: 1.5  ? Smokeless tobacco: Never  ? Tobacco comments:  ?  states a pack every three days  ?Vaping Use  ? Vaping Use: Never used  ?Substance Use Topics  ? Alcohol use: Yes  ?  Comment: rare  ? Drug use: Not Currently  ?  Types: Marijuana  ?  Comment: last was July 2021  ? ? ? ?Allergies   ?Mushroom extract complex; Lime [lime, sulfurated (calcium polysulfide)]; and Spinach ? ? ?Review of Systems ?Review of Systems  ?Constitutional: Negative.   ?Respiratory: Negative.    ?Cardiovascular: Negative.   ?Gastrointestinal:  Positive for abdominal pain, nausea and vomiting. Negative for abdominal distention, anal bleeding, blood in stool, constipation, diarrhea and rectal pain.  ?Genitourinary: Negative.   ?Skin: Negative.   ? ? ?Physical Exam ?Triage Vital Signs ?ED Triage Vitals  ?Enc Vitals Group  ?   BP 05/03/21 1721 108/78  ?   Pulse Rate 05/03/21 1721 (!) 107  ?   Resp 05/03/21 1721 18  ?   Temp 05/03/21 1721 99.6 ?F (37.6 ?C)  ?   Temp Source 05/03/21 1721 Oral  ?   SpO2 05/03/21 1721 96 %  ?   Weight --   ?   Height --   ?   Head Circumference --   ?   Peak Flow --   ?   Pain Score 05/03/21 1717 0  ?   Pain Loc --   ?   Pain Edu? --   ?   Excl. in [place]? --   ? ?No data found. ? ?Updated  Vital Signs ?BP 108/78 (BP Location: Right Arm) Comment (BP Location): large cuff  Pulse (!) 107   Temp 99.6 ?F (37.6 ?C) (Oral)   Resp 18   LMP 04/24/2021  SpO2 96%  ? ?Visual Acuity ?Right Eye Distance:   ?Left Eye Distance:   ?Bilateral Distance:   ? ?Right Eye Near:   ?Left Eye Near:    ?Bilateral Near:    ? ?Physical Exam ?Constitutional:   ?   Appearance: Normal appearance. She is well-developed.  ?HENT:  ?   Head: Normocephalic.  ?Eyes:  ?   Extraocular Movements: Extraocular movements intact.  ?Pulmonary:  ?   Effort: Pulmonary effort is normal.  ?Abdominal:  ?   General: Abdomen is flat. Bowel sounds are normal.  ?   Palpations: Abdomen is soft.  ?   Tenderness: There is abdominal tenderness in the right lower quadrant, epigastric area and left lower quadrant.  ?Skin: ?   General: Skin is warm and dry.  ?Neurological:  ?   Mental Status: She is alert and oriented to person, place, and time.  ?Psychiatric:     ?   Mood and Affect: Mood normal.     ?   Behavior: Behavior normal.  ? ? ? ?UC Treatments / Results  ?Labs ?(all labs ordered are listed, but only abnormal results are displayed) ?Labs Reviewed - No data to display ? ?EKG ? ? ?Radiology ?No results found. ? ?Procedures ?Procedures (including critical care time) ? ?Medications Ordered in UC ?Medications - No data to display ? ?Initial Impression / Assessment and Plan / UC Course  ?I have reviewed the triage vital signs and the nursing notes. ? ?Pertinent labs & imaging results that were available during my care of the patient were reviewed by me and considered in my medical decision making (see chart for details). ? ?Lower abdominal pain ?Nausea with vomiting ? ?Vital signs are stable, patient is in no signs of distress, tenderness noted to the epigastric and intestinal regions, unknown etiology, will move forward with treatment of symptoms, prescribed Z-Pak for bacterial coverage, Zofran and Bentyl, advised increase fluid intake through use of  water to prevent dehydration, for persistent symptoms recommended follow-up with PCP and for worsening symptoms recommend follow-up in nearest emergency report ?Final Clinical Impressions(s) / UC Diagnoses  ? ?

## 2021-05-03 NOTE — Discharge Instructions (Signed)
The cause of your symptoms is unknown at this time however since it has been 5 days with no improvement we will move forward with treatment of the symptoms ? ?You may use dicyclomine twice daily for 10 days, may start medication if symptoms resolve prior to, this medication is to help reduce stomach pain ? ?You may use Zofran tablet every 8 hours, will help to dissolve under the tongue, wait 30 minutes to an hour before attempting to eat or drink ? ?You may take azithromycin as directed, this is for bacterial coverage ? ?Until your symptoms are resolved, increase your fluid intake to prevent dehydration ? ?If your symptoms continue to persist please follow-up with your primary doctor, for worsening symptoms please go to the nearest emergency department for evaluation and need for imaging ?

## 2021-05-03 NOTE — ED Triage Notes (Signed)
Onset of symptoms was Friday or Saturday.  Symptoms include abdominal cramping and vomiting.  Reports stools 3-4 /day.  Patient has clear vaginal discharge ?

## 2021-06-06 ENCOUNTER — Ambulatory Visit: Payer: Self-pay

## 2021-07-01 IMAGING — US US MFM OB FOLLOW-UP
1 series · 14 of 28 positions shown · non-contrast
Comparison: none

[Series 1: us mfm ob follow-up · 57 acquisitions, 14 frames shown]
[im 3/57]
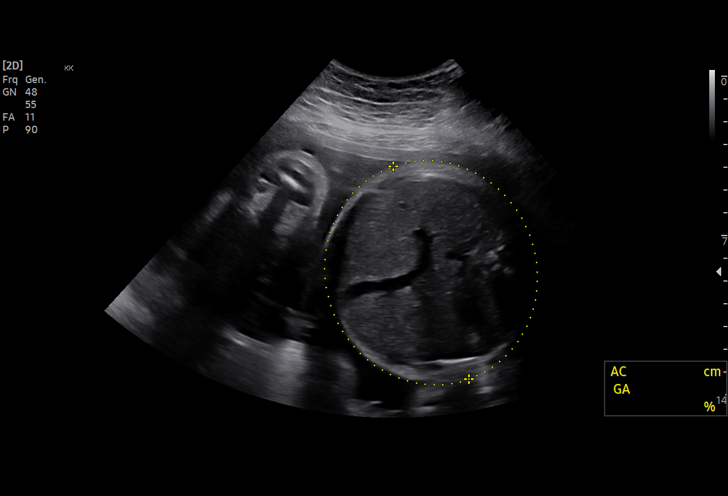
[im 7/57]
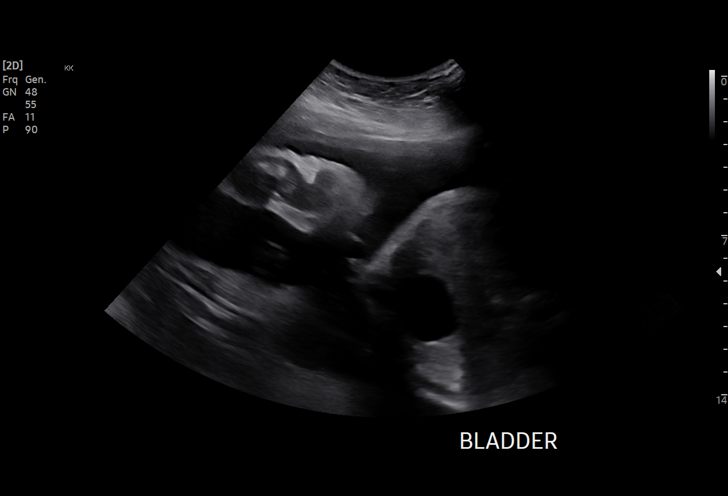
[im 11/57]
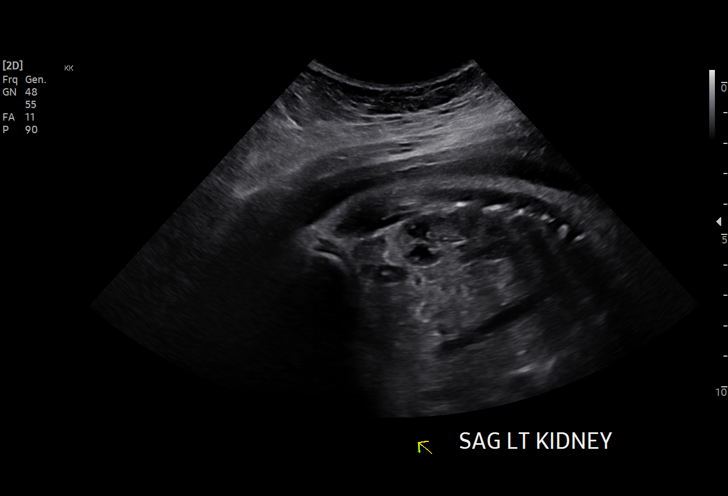
[im 15/57]
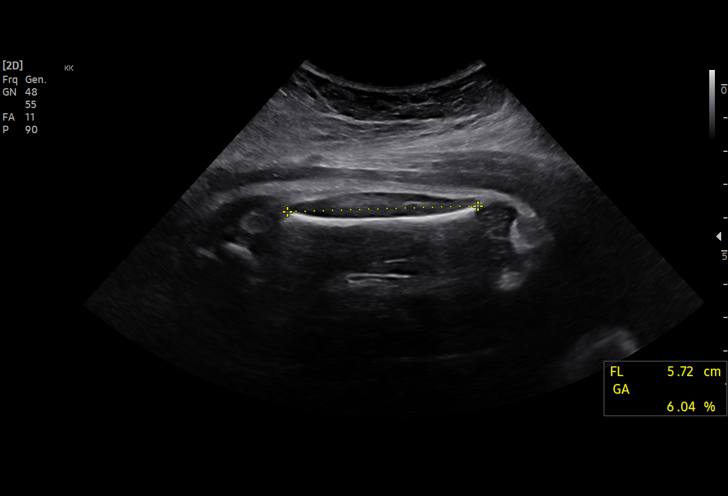
[im 19/57]
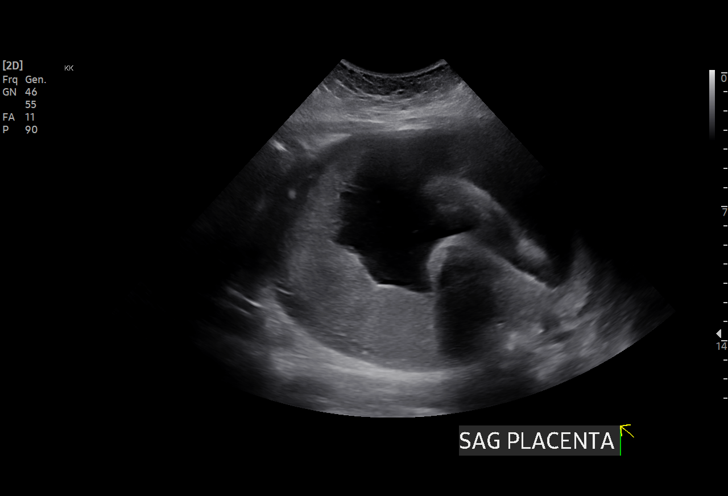
[im 23/57]
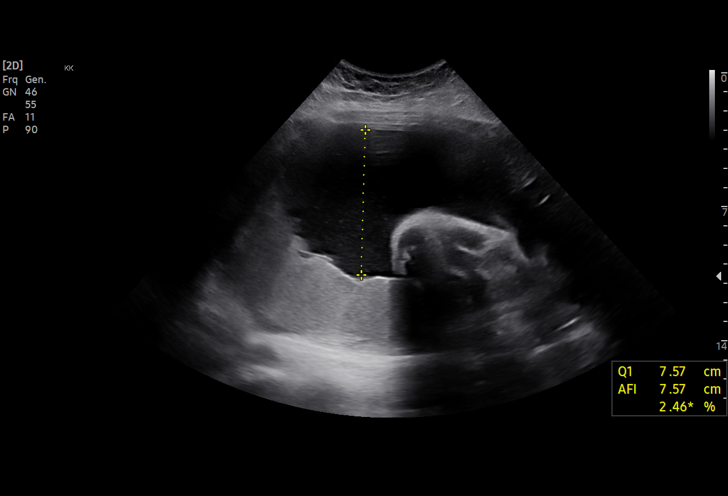
[im 27/57]
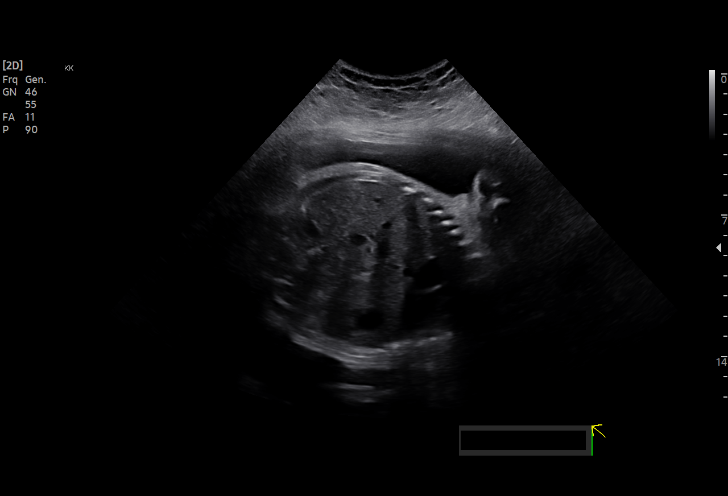
[im 32/57]
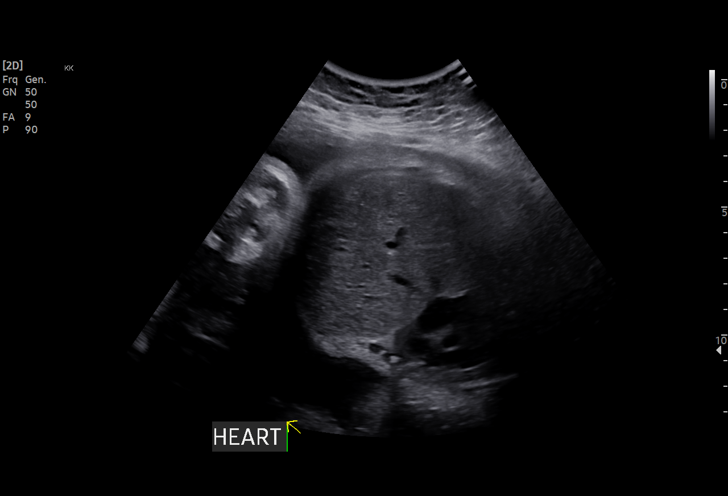
[im 36/57]
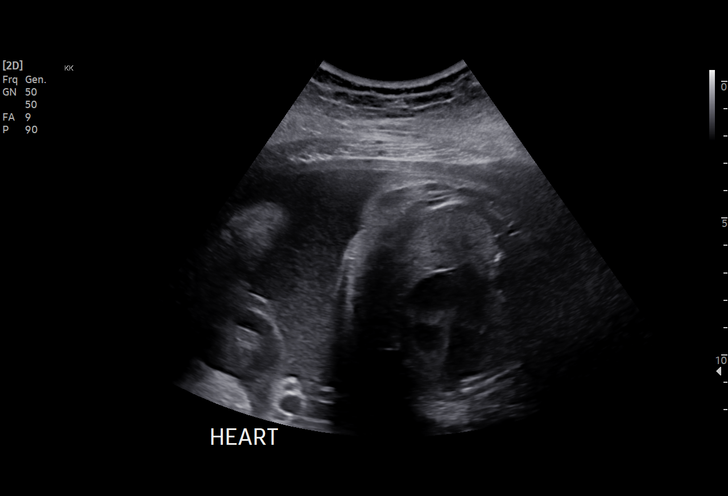
[im 40/57]
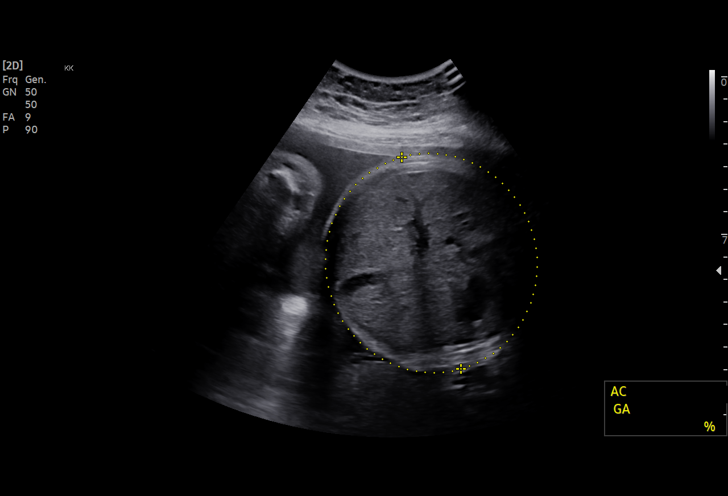
[im 44/57]
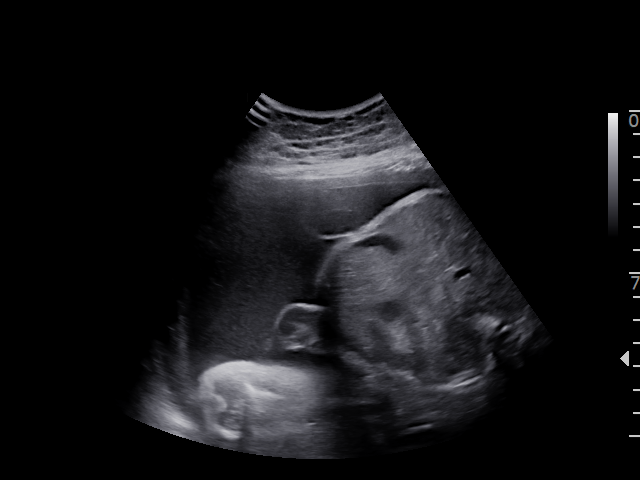
[im 48/57]
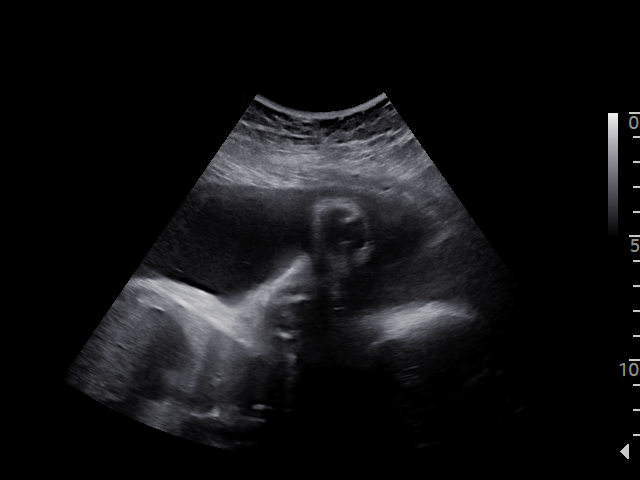
[im 52/57]
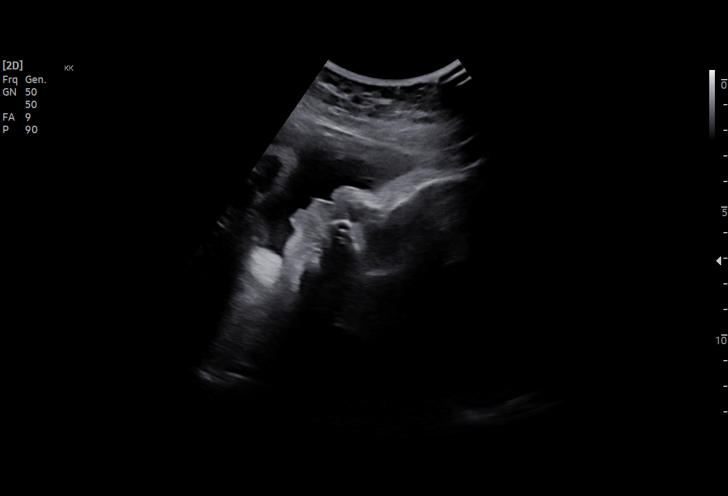
[im 57/57]
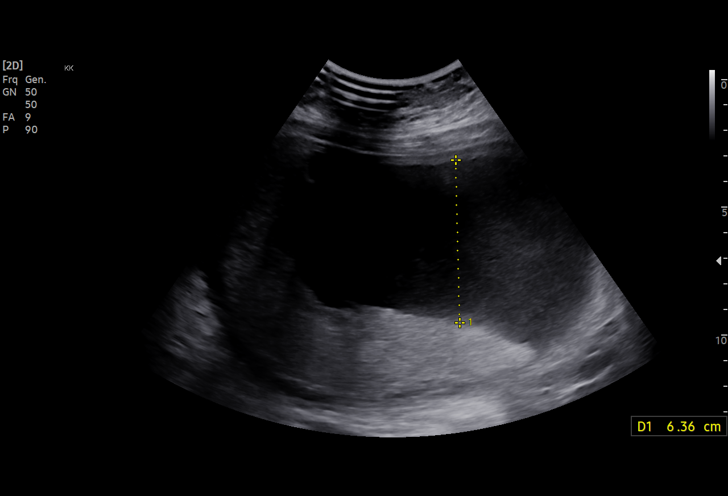

[14 of 28 positions shown; findings below may reference images not displayed]

TK

Indications

 Obesity complicating pregnancy, third
 trimester (BMI 34)
 Marginal insertion of umbilical cord affecting
 management of mother in third trimester
 Encounter for other antenatal screening
 follow-up
 Low Risk NIPS, Negative AFP
 31 weeks gestation of pregnancy
Vital Signs

 BMI:
Fetal Evaluation

 Num Of Fetuses:         1
 Fetal Heart Rate(bpm):  133
 Cardiac Activity:       Observed
 Presentation:           Cephalic
 Placenta:               Posterior
 P. Cord Insertion:      Marginal insertion prev

 Amniotic Fluid
 AFI FV:      Polyhydramnios

 AFI Sum(cm)     %Tile       Largest Pocket(cm)
 25.62           96
 RUQ(cm)       RLQ(cm)       LUQ(cm)        LLQ(cm)

Biometry

 BPD:      87.9  mm     G. Age:  35w 4d       > 99  %    CI:        79.86   %    70 - 86
                                                         FL/HC:      18.7   %    19.1 -
 HC:      310.8  mm     G. Age:  34w 5d         91  %    HC/AC:      1.03        0.96 -
 AC:      302.1  mm     G. Age:  34w 1d         98  %    FL/BPD:     66.1   %    71 - 87
 FL:       58.1  mm     G. Age:  30w 3d         11  %    FL/AC:      19.2   %    20 - 24

 Est. FW:    8728  gm    4 lb 12 oz      89  %
OB History

 Gravidity:    3
Gestational Age

 LMP:           31w 4d        Date:  02/23/19                 EDD:   11/30/19
 U/S Today:     33w 5d                                        EDD:   11/15/19
 Best:          31w 4d     Det. By:  LMP  (02/23/19)          EDD:   11/30/19
Anatomy

 Cranium:               Appears normal         Aortic Arch:            Previously seen
 Cavum:                 Previously seen        Ductal Arch:            Previously seen
 Ventricles:            Previously seen        Diaphragm:              Appears normal
 Choroid Plexus:        Previously seen        Stomach:                Appears normal, left
                                                                       sided
 Cerebellum:            Previously seen        Abdomen:                Previously seen
 Posterior Fossa:       Previously seen        Abdominal Wall:         Previously seen
 Nuchal Fold:           Not applicable (>20    Cord Vessels:           Previously seen
                        wks GA)
 Face:                  Orbits and profile     Kidneys:                Appear normal
                        previously seen
 Lips:                  Previously seen        Bladder:                Appears normal
 Thoracic:              Appears normal         Spine:                  Previously seen
 Heart:                 Appears normal         Upper Extremities:      Previously seen
                        (4CH, axis, and
                        situs)
 RVOT:                  Previously seen        Lower Extremities:      Previously seen
 LVOT:                  Previously seen

 Other:  Fetus appears to be female. Heels, 5th digit, Nasal bone,and Open
         hands visualized prev.
Cervix Uterus Adnexa

 Cervix
 Not visualized (advanced GA >16wks)
Comments

 This patient was seen for a follow up growth scan due to a
 marginal placental cord insertion that was noted during her
 prior ultrasound exams.  Polyhydramnios was also noted
 during her last exam.  She has screened negative for
 gestational diabetes in her current pregnancy.  She denies
 any problems since her last exam.
 She was informed that the fetal growth appears appropriate
 for her gestational age.  Mild polyhydramnios with a total AFI
 of 25.6 cm continues to be noted today.  The increased risk
 of PPROM and preterm labor/contractions due to
 polyhydramnios was discussed.
 A follow up exam was scheduled in 5 weeks.

## 2021-07-30 ENCOUNTER — Other Ambulatory Visit: Payer: Self-pay

## 2021-07-30 ENCOUNTER — Encounter (HOSPITAL_COMMUNITY): Payer: Self-pay

## 2021-07-30 ENCOUNTER — Emergency Department (HOSPITAL_COMMUNITY)
Admission: EM | Admit: 2021-07-30 | Discharge: 2021-07-30 | Disposition: A | Discharge status: Home / Self Care | Payer: Medicaid Other | Attending: Emergency Medicine | Admitting: Emergency Medicine

## 2021-07-30 ENCOUNTER — Emergency Department (HOSPITAL_COMMUNITY): Payer: Medicaid Other

## 2021-07-30 DIAGNOSIS — S90121A Contusion of right lesser toe(s) without damage to nail, initial encounter: Secondary | ICD-10-CM | POA: Insufficient documentation

## 2021-07-30 DIAGNOSIS — S99921A Unspecified injury of right foot, initial encounter: Secondary | ICD-10-CM | POA: Diagnosis present

## 2021-07-30 DIAGNOSIS — W109XXA Fall (on) (from) unspecified stairs and steps, initial encounter: Secondary | ICD-10-CM | POA: Insufficient documentation

## 2021-07-30 NOTE — Discharge Instructions (Signed)
Your x-ray does not show any broken bones.

## 2021-07-30 NOTE — ED Provider Notes (Signed)
[facility] EMERGENCY DEPARTMENT Provider Note   CSN: [id number] Arrival date & time: 07/30/21  1517     History  No chief complaint on file.   Robyn Cummings is a 26 y.o. female.  Patient presents for evaluation of right fourth toe pain.  Yesterday she struck her foot on a step when she fell.  Denies other injuries.  No treatments prior to arrival.  Pain is worse with movement.  Currently the patient states that she is "angry" and is not feeling any pain.       Home Medications Prior to Admission medications   Medication Sig Start Date End Date Taking? Authorizing Provider  acetaminophen (TYLENOL) 325 MG tablet Take 2 tablets (650 mg total) by mouth every 6 (six) hours. 61/60/73   Arrie Senate, MD  albuterol (VENTOLIN HFA) 108 (90 Base) MCG/ACT inhaler Inhale 1-2 puffs into the lungs every 6 (six) hours as needed for wheezing or shortness of breath. Patient not taking: Reported on 05/08/2019 11/06/18   Petrucelli, Glynda Jaeger, PA-C  azithromycin (ZITHROMAX) 250 MG tablet Take 1 tablet (250 mg total) by mouth daily. Take first 2 tablets together, then 1 every day until finished. 05/03/21   Hans Eden, NP  Blood Pressure Monitoring (BLOOD PRESSURE KIT) DEVI 1 kit by Does not apply route once a week. Check Blood Pressure regularly and record readings into the Babys[employer] App.  Large Cuff.  DX O90.0 05/08/19   Sloan Leiter, MD  coconut oil OIL Apply 1 application topically as needed. Patient not taking: Reported on 12/01/2019 71/06/26   Arrie Senate, MD  dicyclomine (BENTYL) 20 MG tablet Take 1 tablet (20 mg total) by mouth 2 (two) times daily. 05/03/21   Hans Eden, NP  Elastic Bandages & Supports (COMFORT FIT MATERNITY SUPP LG) MISC 1 Units by Does not apply route daily. 11/10/19   Julianne Handler, CNM  ferrous sulfate (FERROUSUL) 325 (65 FE) MG tablet Take 1 tablet (325 mg total) by mouth every other day. 09/15/19   Leftwich-Kirby, Kathie Dike, CNM   ibuprofen (ADVIL) 600 MG tablet Take 1 tablet (600 mg total) by mouth every 6 (six) hours. 94/85/46   Arrie Senate, MD  naproxen (NAPROSYN) 500 MG tablet Take 1 tablet (500 mg total) by mouth 2 (two) times daily. Patient not taking: Reported on 05/03/2021 04/26/20   Pearson Forster, NP  NIFEdipine (PROCARDIA-XL/NIFEDICAL-XL) 30 MG 24 hr tablet Take 1 tablet (30 mg total) by mouth daily. 01/07/20   Rasch, Anderson Malta I, NP  norethindrone-ethinyl estradiol (LOESTRIN 1/20, 21,) 1-20 MG-MCG tablet Take 1 tablet by mouth daily. Patient not taking: Reported on 05/03/2021 09/24/20   Carmin Muskrat, MD  ondansetron (ZOFRAN-ODT) 4 MG disintegrating tablet Take 1 tablet (4 mg total) by mouth every 8 (eight) hours as needed for nausea or vomiting. 05/03/21   White, Adrienne R, NP  Prenat-Fe Poly-Methfol-FA-DHA (VITAFOL FE+) 90-0.6-0.4-200 MG CAPS Take 1 capsule by mouth at bedtime.  Patient not taking: Reported on 12/01/2019 09/04/19   [provider]  vitamin C (ASCORBIC ACID) 250 MG tablet Take 1 tablet (250 mg total) by mouth every other day. Take with iron. Patient not taking: Reported on 12/01/2019 27/03/50   Arrie Senate, MD      Allergies    Mushroom extract complex; Lime [lime, sulfurated (calcium polysulfide)]; and Spinach    Review of Systems   Review of Systems  Physical Exam Updated Vital Signs BP (!) 126/91 (BP Location: Right  Arm)   Pulse 78   Temp 97.8 F (36.6 C) (Oral)   Resp 17   SpO2 97%  Physical Exam Vitals and nursing note reviewed.  Constitutional:      Appearance: She is well-developed.  HENT:     Head: Normocephalic and atraumatic.  Eyes:     Pupils: Pupils are equal, round, and reactive to light.  Cardiovascular:     Pulses: Normal pulses. No decreased pulses.  Musculoskeletal:        General: Tenderness present.     Cervical back: Normal range of motion and neck supple.     Comments: Mild swelling of the right fourth toe.  Tenderness with passive  range of motion.  No bleeding or wounds.  Patient has mild tenderness when I range the fifth toe and third toe, but tenderness seems to be mild.  Skin:    General: Skin is warm and dry.  Neurological:     Mental Status: She is alert.     Sensory: No sensory deficit.     Comments: Motor, sensation, and vascular distal to the injury is fully intact.   Psychiatric:        Mood and Affect: Affect is tearful.     ED Results / Procedures / Treatments   Labs (all labs ordered are listed, but only abnormal results are displayed) Labs Reviewed - No data to display  EKG None  Radiology DG Foot Complete Right  Result Date: 07/30/2021 CLINICAL DATA:  Fourth toe injury.  Pain.  Redness and swelling. EXAM: RIGHT FOOT COMPLETE - 3+ VIEW COMPARISON:  None Available. FINDINGS: There is no evidence of fracture or dislocation. There is no evidence of arthropathy or other focal bone abnormality. Soft tissues are unremarkable. IMPRESSION: Negative. Electronically Signed   By: Dorise Bullion III M.D.   On: 07/30/2021 10:34    Procedures Procedures    Medications Ordered in ED Medications - No data to display  ED Course/ Medical Decision Making/ A&P    Patient seen and examined. History obtained directly from patient. Work-up including labs, imaging, EKG ordered in triage, if performed, were reviewed.    Labs/EKG: None ordered.   Imaging: X-ray of the foot, agree negative.   Medications/Fluids: None ordered  Most recent vital signs reviewed and are as follows: BP (!) 126/91 (BP Location: Right Arm)   Pulse 78   Temp 97.8 F (36.6 C) (Oral)   Resp 17   SpO2 97%   Initial impression: Toe contusion  Home treatment plan: Buddy tape, RICE protocol, OTC meds  Return instructions discussed with patient: Worsening or changing symptoms  Follow-up instructions discussed with patient: Follow-up with PCP as needed                          Medical Decision Making Amount and/or Complexity of  Data Reviewed Radiology: ordered.   Patient stubbed her toe, no fractures.  No open wounds.        Final Clinical Impression(s) / ED Diagnoses Final diagnoses:  Contusion of lesser toe of right foot without damage to nail, initial encounter    Rx / DC Orders ED Discharge Orders     None         Carlisle Cater, PA-C 07/30/21 Donnellson, [place], DO 07/30/21 1329

## 2021-07-30 NOTE — ED Triage Notes (Signed)
Patient complains of right foot 4th toe pain after falling down a step yesterday. Redness and swelling noted, pain to same

## 2021-07-30 NOTE — ED Notes (Signed)
Pt refused DC VS.

## 2021-12-05 ENCOUNTER — Ambulatory Visit: Payer: Self-pay

## 2021-12-11 ENCOUNTER — Other Ambulatory Visit: Payer: Self-pay

## 2021-12-11 ENCOUNTER — Encounter (HOSPITAL_COMMUNITY): Payer: Self-pay

## 2021-12-11 ENCOUNTER — Emergency Department (HOSPITAL_COMMUNITY)
Admission: EM | Admit: 2021-12-11 | Discharge: 2021-12-11 | Disposition: A | Discharge status: Home / Self Care | Payer: Medicaid Other | Attending: Student | Admitting: Student

## 2021-12-11 ENCOUNTER — Emergency Department (HOSPITAL_COMMUNITY): Payer: Medicaid Other

## 2021-12-11 DIAGNOSIS — S21311A Laceration without foreign body of right front wall of thorax with penetration into thoracic cavity, initial encounter: Secondary | ICD-10-CM | POA: Insufficient documentation

## 2021-12-11 DIAGNOSIS — Z23 Encounter for immunization: Secondary | ICD-10-CM | POA: Insufficient documentation

## 2021-12-11 DIAGNOSIS — T148XXA Other injury of unspecified body region, initial encounter: Secondary | ICD-10-CM

## 2021-12-11 MED ORDER — LIDOCAINE-EPINEPHRINE (PF) 2 %-1:200000 IJ SOLN
10.0000 mL | Freq: Once | INTRAMUSCULAR | Status: AC
  Administered 2021-12-11: 10 mL
  Filled 2021-12-11: qty 20

## 2021-12-11 MED ORDER — ACETAMINOPHEN 500 MG PO TABS
1000.0000 mg | ORAL_TABLET | Freq: Once | ORAL | Status: AC
  Administered 2021-12-11: 1000 mg via ORAL
  Filled 2021-12-11: qty 2

## 2021-12-11 MED ORDER — TETANUS-DIPHTH-ACELL PERTUSSIS 5-2.5-18.5 LF-MCG/0.5 IM SUSY
0.5000 mL | PREFILLED_SYRINGE | Freq: Once | INTRAMUSCULAR | Status: AC
  Administered 2021-12-11: 0.5 mL via INTRAMUSCULAR
  Filled 2021-12-11: qty 0.5

## 2021-12-11 NOTE — ED Notes (Signed)
The patient came to the tech station of the waiting area and asked if there was an update on pain medication. This EMT informed the patient that the nurse was not able receive pain medication until there was a PA in triage. The patient was agitated stating that, "It was not cool" for her to have to sit in the waiting area for 4 hours without any type of pain medication. The patient asked if there was a way to be transferred to Urgent care. The patient was informed that she could not be transferred to urgent care but she was more than welcome to leave to be seen at that facility, but strongly encouraged to stay and be seen here.

## 2021-12-11 NOTE — Discharge Instructions (Signed)
Return for any problem.  Sutures placed today should dissolve on their own over the next 1 to 2 weeks.  Keep wound clean and dry.  Dressing placed today should remain in place for 24 hours.  Thereafter you can clean the area with soap and water.
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
## 2022-02-13 ENCOUNTER — Other Ambulatory Visit: Payer: Self-pay

## 2022-02-13 ENCOUNTER — Emergency Department (HOSPITAL_COMMUNITY)
Admission: EM | Admit: 2022-02-13 | Discharge: 2022-02-13 | Disposition: A | Discharge status: Home / Self Care | Payer: Medicaid Other | Attending: Emergency Medicine | Admitting: Emergency Medicine

## 2022-02-13 ENCOUNTER — Encounter (HOSPITAL_COMMUNITY): Payer: Self-pay | Admitting: Emergency Medicine

## 2022-02-13 DIAGNOSIS — N938 Other specified abnormal uterine and vaginal bleeding: Secondary | ICD-10-CM | POA: Diagnosis not present

## 2022-02-13 DIAGNOSIS — N939 Abnormal uterine and vaginal bleeding, unspecified: Secondary | ICD-10-CM | POA: Diagnosis present

## 2022-02-13 LAB — LIPASE, BLOOD: Lipase: 39 U/L (ref 11–51)

## 2022-02-13 LAB — URINALYSIS, ROUTINE W REFLEX MICROSCOPIC
Bilirubin Urine: NEGATIVE
Glucose, UA: NEGATIVE mg/dL
Ketones, ur: NEGATIVE mg/dL
Leukocytes,Ua: NEGATIVE
Nitrite: NEGATIVE
Protein, ur: NEGATIVE mg/dL
Specific Gravity, Urine: 1.024 (ref 1.005–1.030)
pH: 6 (ref 5.0–8.0)

## 2022-02-13 LAB — CBC WITH DIFFERENTIAL/PLATELET
Abs Immature Granulocytes: 0.02 10*3/uL (ref 0.00–0.07)
Basophils Absolute: 0.1 10*3/uL (ref 0.0–0.1)
Basophils Relative: 1 %
Eosinophils Absolute: 0.3 10*3/uL (ref 0.0–0.5)
Eosinophils Relative: 3 %
HCT: 36.3 % (ref 36.0–46.0)
Hemoglobin: 11.4 g/dL — ABNORMAL LOW (ref 12.0–15.0)
Immature Granulocytes: 0 %
Lymphocytes Relative: 25 %
Lymphs Abs: 2.4 10*3/uL (ref 0.7–4.0)
MCH: 27.7 pg (ref 26.0–34.0)
MCHC: 31.4 g/dL (ref 30.0–36.0)
MCV: 88.1 fL (ref 80.0–100.0)
Monocytes Absolute: 0.4 10*3/uL (ref 0.1–1.0)
Monocytes Relative: 5 %
Neutro Abs: 6.4 10*3/uL (ref 1.7–7.7)
Neutrophils Relative %: 66 %
Platelets: 252 10*3/uL (ref 150–400)
RBC: 4.12 MIL/uL (ref 3.87–5.11)
RDW: 14.9 % (ref 11.5–15.5)
WBC: 9.6 10*3/uL (ref 4.0–10.5)
nRBC: 0 % (ref 0.0–0.2)

## 2022-02-13 LAB — COMPREHENSIVE METABOLIC PANEL
ALT: 8 U/L (ref 0–44)
AST: 12 U/L — ABNORMAL LOW (ref 15–41)
Albumin: 3.9 g/dL (ref 3.5–5.0)
Alkaline Phosphatase: 84 U/L (ref 38–126)
Anion gap: 6 (ref 5–15)
BUN: 13 mg/dL (ref 6–20)
CO2: 23 mmol/L (ref 22–32)
Calcium: 8.7 mg/dL — ABNORMAL LOW (ref 8.9–10.3)
Chloride: 109 mmol/L (ref 98–111)
Creatinine, Ser: 0.7 mg/dL (ref 0.44–1.00)
GFR, Estimated: >60 mL/min (ref >60)
Glucose, Bld: 97 mg/dL (ref 70–99)
Potassium: 3.8 mmol/L (ref 3.5–5.1)
Sodium: 138 mmol/L (ref 135–145)
Total Bilirubin: 0.5 mg/dL (ref 0.3–1.2)
Total Protein: 6.9 g/dL (ref 6.5–8.1)

## 2022-02-13 LAB — I-STAT BETA HCG BLOOD, ED (MC, WL, AP ONLY): I-stat hCG, quantitative: <5 m[IU]/mL (ref <5)

## 2022-02-13 NOTE — Discharge Instructions (Signed)
All labs today were normal.
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
## 2022-02-14 LAB — GC/CHLAMYDIA PROBE AMP (~~LOC~~) NOT AT ARMC
Chlamydia: NEGATIVE
Comment: NEGATIVE
Comment: NORMAL
Neisseria Gonorrhea: NEGATIVE

## 2022-02-16 ENCOUNTER — Emergency Department (HOSPITAL_COMMUNITY)
Admission: EM | Admit: 2022-02-16 | Discharge: 2022-02-16 | Discharge status: Against Medical Advice | Payer: Medicaid Other | Attending: Emergency Medicine | Admitting: Emergency Medicine

## 2022-02-16 ENCOUNTER — Other Ambulatory Visit: Payer: Self-pay

## 2022-02-16 DIAGNOSIS — N939 Abnormal uterine and vaginal bleeding, unspecified: Secondary | ICD-10-CM | POA: Insufficient documentation

## 2022-02-16 DIAGNOSIS — M545 Low back pain, unspecified: Secondary | ICD-10-CM | POA: Diagnosis not present

## 2022-02-16 DIAGNOSIS — Z5321 Procedure and treatment not carried out due to patient leaving prior to being seen by health care provider: Secondary | ICD-10-CM | POA: Insufficient documentation

## 2022-02-16 LAB — BASIC METABOLIC PANEL
Anion gap: 9 (ref 5–15)
BUN: 13 mg/dL (ref 6–20)
CO2: 26 mmol/L (ref 22–32)
Calcium: 9.1 mg/dL (ref 8.9–10.3)
Chloride: 103 mmol/L (ref 98–111)
Creatinine, Ser: 0.65 mg/dL (ref 0.44–1.00)
GFR, Estimated: >60 mL/min (ref >60)
Glucose, Bld: 93 mg/dL (ref 70–99)
Potassium: 3.6 mmol/L (ref 3.5–5.1)
Sodium: 138 mmol/L (ref 135–145)

## 2022-02-16 LAB — I-STAT BETA HCG BLOOD, ED (MC, WL, AP ONLY): I-stat hCG, quantitative: <5 m[IU]/mL (ref <5)

## 2022-02-16 LAB — URINALYSIS, ROUTINE W REFLEX MICROSCOPIC
Bilirubin Urine: NEGATIVE
Glucose, UA: NEGATIVE mg/dL
Ketones, ur: NEGATIVE mg/dL
Leukocytes,Ua: NEGATIVE
Nitrite: NEGATIVE
Protein, ur: NEGATIVE mg/dL
Specific Gravity, Urine: 1.018 (ref 1.005–1.030)
pH: 6 (ref 5.0–8.0)

## 2022-02-16 LAB — CBC
HCT: 37.9 % (ref 36.0–46.0)
Hemoglobin: 11.8 g/dL — ABNORMAL LOW (ref 12.0–15.0)
MCH: 27.6 pg (ref 26.0–34.0)
MCHC: 31.1 g/dL (ref 30.0–36.0)
MCV: 88.8 fL (ref 80.0–100.0)
Platelets: 266 10*3/uL (ref 150–400)
RBC: 4.27 MIL/uL (ref 3.87–5.11)
RDW: 14.9 % (ref 11.5–15.5)
WBC: 10.3 10*3/uL (ref 4.0–10.5)
nRBC: 0 % (ref 0.0–0.2)

## 2022-02-16 NOTE — ED Triage Notes (Signed)
Pt c/o vaginal bleeding x 8 days states "it has lightened up since I was here 3 days ago" lower abd pain that radiate to the back.

## 2022-02-16 NOTE — ED Provider Triage Note (Signed)
Emergency Medicine Provider Triage Evaluation Note  Robyn Cummings , a 27 y.o. female  was evaluated in triage.  Pt complains of vaginal bleeding that started a week ago.  Patient reports half the amount of bleeding she had 3 days ago when she first came in.  Reports abdominal pain and low back pain.  No fever, nausea, vomiting, urinary symptoms.  Review of Systems  Positive: As above Negative: As above  Physical Exam  BP (!) 131/93 (BP Location: Right Arm)   Pulse 64   Temp 98.8 F (37.1 C) (Oral)   Resp 16   Ht 5' 4"$  (1.626 m)   Wt 83.9 kg   LMP 02/04/2022   SpO2 100%   BMI 31.75 kg/m  Gen:   Awake, no distress   Resp:  Normal effort  MSK:   Moves extremities without difficulty  Other:    Medical Decision Making  Medically screening exam initiated at 7:01 PM.  Appropriate orders placed.  Marquis Sinclair was informed that the remainder of the evaluation will be completed by another provider, this initial triage assessment does not replace that evaluation, and the importance of remaining in the ED until their evaluation is complete.    Rex Kras, [place] 02/17/22 [phone]

## 2022-02-16 NOTE — ED Notes (Signed)
Pt. Walks out in the hallway fully dressed stating "I am hungry. My bones hurt. I been here for 6 fucking hours and I have not seen a doctor. I'm ready to go." Pt eloped from the ED.

## 2022-02-22 ENCOUNTER — Ambulatory Visit: Payer: Medicaid Other | Admitting: Obstetrics and Gynecology

## 2022-02-22 ENCOUNTER — Encounter: Payer: Self-pay | Admitting: Obstetrics and Gynecology

## 2022-02-22 VITALS — Ht 64.5 in | Wt 188.0 lb

## 2022-02-22 DIAGNOSIS — N939 Abnormal uterine and vaginal bleeding, unspecified: Secondary | ICD-10-CM | POA: Diagnosis not present

## 2022-02-22 MED ORDER — MEGESTROL ACETATE 40 MG PO TABS: ORAL_TABLET | ORAL | 0 refills | Status: AC
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
## 2022-02-22 NOTE — Progress Notes (Signed)
Reports only 7 days between last cycle and onset of current VB 01/01/22-01/09/22 cycle 01/27/22-02/02/22 cycle 02/09/22 Onset VB Negative GC/CC 02/13/21, no blood STI testing and declines today

## 2022-02-28 ENCOUNTER — Ambulatory Visit
Admission: RE | Admit: 2022-02-28 | Discharge: 2022-02-28 | Disposition: A | Discharge status: Home / Self Care | Payer: Medicaid Other | Source: Ambulatory Visit | Attending: Internal Medicine | Admitting: Internal Medicine

## 2022-02-28 ENCOUNTER — Other Ambulatory Visit: Payer: Self-pay | Admitting: Internal Medicine

## 2022-02-28 DIAGNOSIS — S3992XA Unspecified injury of lower back, initial encounter: Secondary | ICD-10-CM

## 2022-03-08 ENCOUNTER — Ambulatory Visit
Admission: RE | Admit: 2022-03-08 | Discharge: 2022-03-08 | Disposition: A | Discharge status: Home / Self Care | Payer: Medicaid Other | Source: Ambulatory Visit | Attending: Obstetrics and Gynecology | Admitting: Obstetrics and Gynecology

## 2022-03-08 DIAGNOSIS — N939 Abnormal uterine and vaginal bleeding, unspecified: Secondary | ICD-10-CM | POA: Diagnosis present

## 2022-03-28 ENCOUNTER — Ambulatory Visit: Payer: Medicaid Other | Admitting: Obstetrics and Gynecology

## 2022-03-29 ENCOUNTER — Ambulatory Visit: Admission: RE | Admit: 2022-03-29 | Payer: Medicaid Other | Source: Ambulatory Visit

## 2022-03-29 ENCOUNTER — Other Ambulatory Visit: Payer: Self-pay | Admitting: Internal Medicine

## 2022-03-29 DIAGNOSIS — M545 Low back pain, unspecified: Secondary | ICD-10-CM

## 2022-03-30 ENCOUNTER — Other Ambulatory Visit: Payer: Self-pay | Admitting: Internal Medicine

## 2022-03-30 DIAGNOSIS — S3992XA Unspecified injury of lower back, initial encounter: Secondary | ICD-10-CM

## 2022-03-30 DIAGNOSIS — S199XXD Unspecified injury of neck, subsequent encounter: Secondary | ICD-10-CM

## 2022-03-30 DIAGNOSIS — S199XXA Unspecified injury of neck, initial encounter: Secondary | ICD-10-CM

## 2022-04-11 ENCOUNTER — Ambulatory Visit
Admission: RE | Admit: 2022-04-11 | Discharge: 2022-04-11 | Disposition: A | Discharge status: Home / Self Care | Payer: Medicaid Other | Source: Ambulatory Visit | Attending: Internal Medicine | Admitting: Internal Medicine

## 2022-04-11 DIAGNOSIS — S3992XA Unspecified injury of lower back, initial encounter: Secondary | ICD-10-CM

## 2022-04-11 DIAGNOSIS — S199XXD Unspecified injury of neck, subsequent encounter: Secondary | ICD-10-CM

## 2022-09-04 ENCOUNTER — Ambulatory Visit (HOSPITAL_COMMUNITY)
Admission: EM | Admit: 2022-09-04 | Discharge: 2022-09-04 | Disposition: A | Discharge status: Home / Self Care | Payer: Medicaid Other | Attending: Family Medicine | Admitting: Family Medicine

## 2022-09-04 ENCOUNTER — Encounter (HOSPITAL_COMMUNITY): Payer: Self-pay

## 2022-09-04 DIAGNOSIS — S3141XA Laceration without foreign body of vagina and vulva, initial encounter: Secondary | ICD-10-CM | POA: Diagnosis present

## 2022-09-04 NOTE — ED Provider Notes (Signed)
[facility]    CSN: [id number] Arrival date & time: 09/04/22  1357      History   Chief Complaint Chief Complaint  Patient presents with   Vaginal Pain    Vaginal pain x1 week    HPI Robyn Cummings is a 27 y.o. female.    Vaginal Pain  Patient shaved, and thinks she may have cut around her vagina.  That was 1 week ago.  She then had her period, and thinks she is allergic to the pads as she is having swelling around the area.  No known STD exposures.        Past Medical History:  Diagnosis Date   ADHD (attention deficit hyperactivity disorder)    Anaphylactic reaction    Anemia    Anxiety    Asthma    Bipolar 1 disorder (HCC)    Chronic back pain    Dyspnea    Hypertension    Infection    UTI   Migraine    Polyhydramnios affecting pregnancy 09/15/2019   Followed by MFM   Seizures (HCC)    has not had a seizure since childhood, no medications currently   Sleep disorder    Vaginal Pap smear, abnormal     Patient Active Problem List   Diagnosis Date Noted   Abnormal uterine bleeding (AUB) 02/22/2022   Iron deficiency anemia 11/26/2019   Tourette's syndrome 11/24/2019   Chronic hypertension 11/09/2019   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 Authorizing Provider  acetaminophen (TYLENOL) 325 MG tablet Take 2 tablets (650 mg total) by mouth every 6 (six) hours. 11/26/19   Alric Seton, MD  megestrol (MEGACE) 40 MG tablet 1 tablet bid until gone 02/22/22   Hermina Staggers, MD    Family History Family History  Problem Relation Age of Onset   Anxiety  disorder Mother    Arthritis Father    Asthma Father    Cancer Maternal Aunt    Diabetes Maternal Uncle    Cancer Paternal Uncle    Diabetes Paternal Grandmother    Stroke Paternal Grandmother     Social History Social History   Tobacco Use   Smoking status: Every Day    Current packs/day: 0.00    Types: Cigarettes    Last attempt to quit: 11/02/2019    Years since quitting: 2.8   Smokeless tobacco: Never   Tobacco comments:    states a pack every three days  Vaping Use   Vaping status: Never Used  Substance Use Topics   Alcohol use: Yes    Comment: rare   Drug use: Not Currently    Types: Marijuana    Comment: last was July 2021     Allergies   Mushroom extract complex; Lime [lime, sulfurated (calcium polysulfide)]; and Spinach   Review of Systems  Review of Systems  Constitutional: Negative.   HENT: Negative.    Respiratory: Negative.    Cardiovascular: Negative.   Gastrointestinal: Negative.   Genitourinary:  Positive for vaginal pain.     Physical Exam Triage Vital Signs ED Triage Vitals  Encounter Vitals Group     BP 09/04/22 1519 124/86     Systolic BP Percentile --      Diastolic BP Percentile --      Pulse Rate 09/04/22 1519 71     Resp 09/04/22 1519 18     Temp 09/04/22 1519 98.1 F (36.7 C)     Temp Source 09/04/22 1519 Oral     SpO2 09/04/22 1519 99 %     Weight 09/04/22 1517 190 lb (86.2 kg)     Height 09/04/22 1517 5\' 4"  (1.626 m)     Head Circumference --      Peak Flow --      Pain Score 09/04/22 1517 0     Pain Loc --      Pain Education --      Exclude from Growth Chart --    No data found.  Updated Vital Signs BP 124/86   Pulse 71   Temp 98.1 F (36.7 C) (Oral)   Resp 18   Ht 5\' 4"  (1.626 m)   Wt 86.2 kg   LMP 08/28/2022   SpO2 99%   BMI 32.61 kg/m   Visual Acuity Right Eye Distance:   Left Eye Distance:   Bilateral Distance:    Right Eye Near:   Left Eye Near:    Bilateral Near:     Physical  Exam Constitutional:      Appearance: Normal appearance.  Genitourinary:    Comments: At the lower aspect of the right labia are two small vertical sores/abrasions;  nontender;  no bleeding/oozing Skin:    General: Skin is warm.  Neurological:     General: No focal deficit present.     Mental Status: She is alert.  Psychiatric:        Mood and Affect: Mood normal.      UC Treatments / Results  Labs (all labs ordered are listed, but only abnormal results are displayed) Labs Reviewed  HSV CULTURE AND TYPING    EKG   Radiology No results found.  Procedures Procedures (including critical care time)  Medications Ordered in UC Medications - No data to display  Initial Impression / Assessment and Plan / UC Course  I have reviewed the triage vital signs and the nursing notes.  Pertinent labs & imaging results that were available during my care of the patient were reviewed by me and considered in my medical decision making (see chart for details).   Final Clinical Impressions(s) / UC Diagnoses   Final diagnoses:  Labial tear, initial encounter     Discharge Instructions      You were seen for a small tear at the right labia.  I have swabbed for HSV, and this will be resulted in several days.  You will be notified if positive, but you will also see this on mychart.  In the mean time I recommend you use a barrier cream, and avoid causing further irritation to the area .  You may use vasoline, or other over the counter creams.  Follow up if not improving.     ED Prescriptions   None    PDMP not reviewed this encounter.   Jannifer Franklin, MD 09/04/22 1538

## 2022-09-04 NOTE — Discharge Instructions (Signed)
You were seen for a small tear at the right labia.  I have swabbed for HSV, and this will be resulted in several days.  You will be notified if positive, but you will also see this on mychart.  In the mean time I recommend you use a barrier cream, and avoid causing further irritation to the area .  You may use vasoline, or other over the counter creams.  Follow up if not improving.

## 2022-09-04 NOTE — ED Triage Notes (Signed)
Pt states that she has a laceration to her vagina. X1 week

## 2022-09-06 LAB — HSV CULTURE AND TYPING

## 2023-10-17 ENCOUNTER — Other Ambulatory Visit: Payer: Self-pay

## 2023-10-17 ENCOUNTER — Emergency Department (HOSPITAL_COMMUNITY)
Admission: EM | Admit: 2023-10-17 | Discharge: 2023-10-17 | Disposition: A | Discharge status: Home / Self Care | Attending: Emergency Medicine | Admitting: Emergency Medicine

## 2023-10-17 DIAGNOSIS — R2232 Localized swelling, mass and lump, left upper limb: Secondary | ICD-10-CM | POA: Insufficient documentation

## 2023-10-17 DIAGNOSIS — R590 Localized enlarged lymph nodes: Secondary | ICD-10-CM

## 2023-10-17 DIAGNOSIS — R222 Localized swelling, mass and lump, trunk: Secondary | ICD-10-CM | POA: Insufficient documentation

## 2023-10-17 DIAGNOSIS — D171 Benign lipomatous neoplasm of skin and subcutaneous tissue of trunk: Secondary | ICD-10-CM

## 2023-10-17 NOTE — ED Provider Notes (Signed)
 [facility] AT [facility] Provider Note   CSN: [id number] Arrival date & time: 10/17/23  1352     Patient presents with: Skin Problem   Robyn Cummings is a 28 y.o. female who presents emergency department with left shoulder bump and bump in her left groin.  Patient reports that she has swelling to the posterior shoulder that has been there for about 10 years it has gotten slightly larger.  She reports that she went to her primary care doctor who did not seem to know what it was but did not feel that it was concerning.  She also notes that she has had a tender swollen area in her left groin she recently had a lot of vulvar irritation because she thinks that she is allergic to her pad.  She noticed the swelling in the groin at that time.  She denies fevers chills or vaginal discharge.   HPI     Prior to Admission medications   Medication Sig Start Date End Date Taking? Authorizing Provider  acetaminophen  (TYLENOL ) 325 MG tablet Take 2 tablets (650 mg total) by mouth every 6 (six) hours. 11/26/19   Firestone, Alicia C, MD  megestrol  (MEGACE ) 40 MG tablet 1 tablet bid until gone 02/22/22   Ervin, Michael L, MD    Allergies: Mushroom extract complex (obsolete); Lime [lime, sulfurated (calcium polysulfide)]; and Spinach    Review of Systems  Updated Vital Signs BP (!) 131/90 (BP Location: Right Arm)   Pulse 74   Temp 98.6 F (37 C) (Oral)   Resp 18   LMP 10/09/2023 (Approximate)   SpO2 98%   Physical Exam Vitals and nursing note reviewed.  Constitutional:      General: She is not in acute distress.    Appearance: She is well-developed. She is not diaphoretic.  HENT:     Head: Normocephalic and atraumatic.     Right Ear: External ear normal.     Left Ear: External ear normal.     Nose: Nose normal.     Mouth/Throat:     Mouth: Mucous membranes are moist.  Eyes:     General: No scleral icterus.    Conjunctiva/sclera: Conjunctivae normal.   Cardiovascular:     Rate and Rhythm: Normal rate and regular rhythm.     Heart sounds: Normal heart sounds. No murmur heard.    No friction rub. No gallop.  Pulmonary:     Effort: Pulmonary effort is normal. No respiratory distress.     Breath sounds: Normal breath sounds.  Abdominal:     General: Bowel sounds are normal. There is no distension.     Palpations: Abdomen is soft. There is no mass.     Tenderness: There is no abdominal tenderness. There is no guarding.  Musculoskeletal:     Cervical back: Normal range of motion.  Skin:    General: Skin is warm and dry.     Comments: Left shoulder with well circumscribed, soft, fatty swelling consistent with lipoma.  Left groin with singular tender left inguinal lymphnode. No other abnormal nodes. No obvious skin or soft tissue infections.   Neurological:     Mental Status: She is alert and oriented to person, place, and time.  Psychiatric:        Behavior: Behavior normal.     (all labs ordered are listed, but only abnormal results are displayed) Labs Reviewed - No data to display  EKG: None  Radiology: No results found.  Procedures   Medications Ordered in the ED - No data to display                                  Medical Decision Making  Patient here with swelling to the left upper back and tenderness in the left groin.  Her exam is consistent with a lipoma of the left shoulder blade and a reactive lymph node in the left groin.  I have explained to the patient that that is usually due to localized infection or irritation.  She denies any active infection symptoms but states that she had an allergic reaction to pad and her vulva was swollen and painful last week.  Advised the patient to follow very closely with her primary care physician for any further needs.  She does not have any other abnormal lymph nodes suggestive of lymphoma..  Advised patient to follow closely with her primary care physician for further  evaluation, no evidence of abscess or other abnormal finding in the groin.  Patient appears appropriate for discharge at this time with close outpatient follow-up and return precautions.     Final diagnoses:  None    ED Discharge Orders     None          Arloa Chroman, PA-C 10/17/23 1749    Franklyn Sid SAILOR, MD 10/17/23 [phone]

## 2023-10-17 NOTE — Discharge Instructions (Signed)
 Contact a health care provider if: You have lymph nodes that: Are still swollen after 2 weeks. Have gotten bigger all of a sudden or the swelling spreads. Are red, painful, or hard. Fluid leaks from the skin near a swollen lymph node. You get a fever, chills, or night sweats. You feel tired. You have a sore throat. Your abdomen hurts. You lose weight without trying.

## 2023-10-17 NOTE — ED Triage Notes (Signed)
 Pt ambulatory to triage with complaints of 2 lumps.Pt has a lump to the LEFT shoulder area, and a bump in the LEFT groin. Pt states that she noticed the one in the groin yesterday. The one on the shoulder has been present for may years.

## 2023-11-27 ENCOUNTER — Other Ambulatory Visit: Payer: Self-pay

## 2023-11-27 ENCOUNTER — Emergency Department (HOSPITAL_COMMUNITY)
Admission: EM | Admit: 2023-11-27 | Discharge: 2023-11-27 | Disposition: A | Discharge status: Home / Self Care | Attending: Emergency Medicine | Admitting: Emergency Medicine

## 2023-11-27 DIAGNOSIS — R569 Unspecified convulsions: Secondary | ICD-10-CM | POA: Insufficient documentation
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
## 2023-11-27 NOTE — Discharge Instructions (Addendum)
 Follow-up with your care team.  Per [place] statutes, patients with seizures are not allowed to drive until they have been seizure-free for six months.  Other recommendations include using caution when using heavy equipment or power tools. Avoid working on ladders or at heights. Take showers instead of baths.  Do not swim alone.  Ensure the water temperature is not too high on the home water heater. Do not go swimming alone. Do not lock yourself in a room alone (i.e. bathroom). When caring for infants or small children, sit down when holding, feeding, or changing them to minimize risk of injury to the child in the event you have a seizure. Maintain good sleep hygiene. Avoid alcohol.  Also recommend adequate sleep, hydration, good diet and minimize stress.     During the Seizure   - First, ensure adequate ventilation and place patients on the floor on their left side  Loosen clothing around the neck and ensure the airway is patent. If the patient is clenching the teeth, do not force the mouth open with any object as this can cause severe damage - Remove all items from the surrounding that can be hazardous. The patient may be oblivious to what's happening and may not even know what he or she is doing. If the patient is confused and wandering, either gently guide him/her away and block access to outside areas - Reassure the individual and be comforting - Call 911. In most cases, the seizure ends before EMS arrives. However, there are cases when seizures may last over 3 to 5 minutes. Or the individual may have developed breathing difficulties or severe injuries. If a pregnant patient or a person with diabetes develops a seizure, it is prudent to call an ambulance. - Finally, if the patient does not regain full consciousness, then call EMS. Most patients will remain confused for about 45 to 90 minutes after a seizure, so you must use judgment in calling for help. - Avoid restraints but make sure  the patient is in a bed with padded side rails - Place the individual in a lateral position with the neck slightly flexed; this will help the saliva drain from the mouth and prevent the tongue from falling backward - Remove all nearby furniture and other hazards from the area - Provide verbal assurance as the individual is regaining consciousness - Provide the patient with privacy if possible - Call for help and start treatment as ordered by the caregiver   After the Seizure (Postictal Stage)   After a seizure, most patients experience confusion, fatigue, muscle pain and/or a headache. Thus, one should permit the individual to sleep. For the next few days, reassurance is essential. Being calm and helping reorient the person is also of importance.   Most seizures are painless and end spontaneously. Seizures are not harmful to others but can lead to complications such as stress on the lungs, brain and the heart. Individuals with prior lung problems may develop labored breathing and respiratory distress.

## 2023-11-27 NOTE — ED Provider Notes (Signed)
 [place] EMERGENCY DEPARTMENT AT [facility] Provider Note   CSN: [id number] Arrival date & time: 11/27/23  9983     Patient presents with: Seizures   Shawntrice Gingerich is a 28 y.o. female.   28 year old female brought in by EMS with concern for possible seizure.  Patient was on a video call with her dad while sitting in her car at her apartment and when she stopped speaking to him dad became concerned and called 911.  EMS arrived and found patient with concern for seizure-like activity, they are provided with midazolam IM and patient immediately became alert and oriented with no postictal state.  Patient states that she has episodes like this when she is unable to get her marijuana, and has not been able to connect with her friend who providers her with marijuana. Concerned tonight's episode lasted longer than usual. No other concerns tonight.        Prior to Admission medications   Medication Sig Start Date End Date Taking? Authorizing Provider  acetaminophen  (TYLENOL ) 325 MG tablet Take 2 tablets (650 mg total) by mouth every 6 (six) hours. 11/26/19   Firestone, Alicia C, MD  megestrol  (MEGACE ) 40 MG tablet 1 tablet bid until gone 02/22/22   Ervin, Michael L, MD    Allergies: Mushroom extract complex (obsolete); Lime [lime, sulfurated (calcium polysulfide)]; and Spinach    Review of Systems Negative except as per HPI Updated Vital Signs BP 123/78 (BP Location: Right Arm)   Pulse 93   Temp 98.4 F (36.9 C)   Resp 17   SpO2 100%   Physical Exam Vitals and nursing note reviewed.  Constitutional:      General: She is not in acute distress.    Appearance: She is well-developed. She is not diaphoretic.  HENT:     Head: Normocephalic and atraumatic.     Mouth/Throat:     Mouth: Mucous membranes are moist.  Eyes:     Pupils: Pupils are equal, round, and reactive to light.  Cardiovascular:     Rate and Rhythm: Normal rate and regular rhythm.     Heart sounds:  Normal heart sounds.  Pulmonary:     Effort: Pulmonary effort is normal.     Breath sounds: Normal breath sounds.  Musculoskeletal:     Cervical back: Neck supple.  Skin:    General: Skin is warm and dry.     Findings: No erythema or rash.  Neurological:     Mental Status: She is alert and oriented to person, place, and time.     Cranial Nerves: No cranial nerve deficit.     Sensory: No sensory deficit.     Motor: No weakness.  Psychiatric:        Behavior: Behavior normal.     (all labs ordered are listed, but only abnormal results are displayed) Labs Reviewed - No data to display  EKG: EKG Interpretation Date/Time:  Wednesday November 27 2023 00:32:14 EST Ventricular Rate:  80 PR Interval:  134 QRS Duration:  77 QT Interval:  355 QTC Calculation: 410 R Axis:   45  Text Interpretation: Sinus rhythm RSR' in V1 or V2, probably normal variant When compared with ECG of 11/06/2018, No significant change was found Confirmed by Raford Lenis ([id number]) on 11/27/2023 2:01:13 AM  Radiology: No results found.   Procedures   Medications Ordered in the ED - No data to display  Medical Decision Making  28 year old female present via EMS after possible seizure. 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 Patient states she has not been able to get in touch with the person who providers her with marijuana and believes this is why she had this episode tonight. She would like to be dc to be able to get in touch with someone who can provide her with marijuana for her to self medicate. Patient is provided with seizure precautions, advised not to drive and to follow up with her care team.      Final diagnoses:  Seizure-like activity [facility])    ED Discharge Orders     None          Beverley Leita DELENA DEVONNA 11/27/23 0221    Raford Lenis, MD 11/27/23 [phone]

## 2023-11-27 NOTE — ED Notes (Signed)
 Patient d/c with home care instructions.
